# Patient Record
Sex: Female | Born: 1950
Health system: Southern US, Community
[De-identification: ages and names within clinical notes are randomized; demographics above are authoritative.]

## PROBLEM LIST (undated history)

## (undated) DIAGNOSIS — C49A4 Gastrointestinal stromal tumor of large intestine: Secondary | ICD-10-CM

## (undated) DIAGNOSIS — T7840XA Allergy, unspecified, initial encounter: Secondary | ICD-10-CM

## (undated) DIAGNOSIS — E785 Hyperlipidemia, unspecified: Secondary | ICD-10-CM

## (undated) DIAGNOSIS — G479 Sleep disorder, unspecified: Secondary | ICD-10-CM

## (undated) DIAGNOSIS — I10 Essential (primary) hypertension: Secondary | ICD-10-CM

## (undated) DIAGNOSIS — D649 Anemia, unspecified: Secondary | ICD-10-CM

## (undated) DIAGNOSIS — E063 Autoimmune thyroiditis: Secondary | ICD-10-CM

## (undated) DIAGNOSIS — K5792 Diverticulitis of intestine, part unspecified, without perforation or abscess without bleeding: Secondary | ICD-10-CM

## (undated) DIAGNOSIS — E538 Deficiency of other specified B group vitamins: Secondary | ICD-10-CM

## (undated) DIAGNOSIS — K900 Celiac disease: Secondary | ICD-10-CM

## (undated) DIAGNOSIS — K219 Gastro-esophageal reflux disease without esophagitis: Secondary | ICD-10-CM

## (undated) HISTORY — DX: Celiac disease: K90.0

## (undated) HISTORY — DX: Hyperlipidemia, unspecified: E78.5

## (undated) HISTORY — DX: Anemia, unspecified: D64.9

## (undated) HISTORY — DX: Essential (primary) hypertension: I10

## (undated) HISTORY — DX: Autoimmune thyroiditis: E06.3

## (undated) HISTORY — DX: Allergy, unspecified, initial encounter: T78.40XA

## (undated) HISTORY — DX: Sleep disorder, unspecified: G47.9

## (undated) HISTORY — DX: Deficiency of other specified B group vitamins: E53.8

## (undated) HISTORY — DX: Diverticulitis of intestine, part unspecified, without perforation or abscess without bleeding: K57.92

## (undated) HISTORY — PX: COLONOSCOPY: SHX174

## (undated) HISTORY — DX: Gastro-esophageal reflux disease without esophagitis: K21.9

## (undated) HISTORY — DX: Gastrointestinal stromal tumor of large intestine: C49.A4

## (undated) SURGERY — Surgical Case
Anesthesia: *Unknown

---

## 2000-10-27 ENCOUNTER — Inpatient Hospital Stay (HOSPITAL_COMMUNITY): Admission: EM | Admit: 2000-10-27 | Discharge: 2000-10-28 | Payer: Self-pay | Admitting: Emergency Medicine

## 2000-10-28 ENCOUNTER — Encounter: Payer: Self-pay | Admitting: Cardiovascular Disease

## 2002-10-11 HISTORY — PX: ESOPHAGOGASTRODUODENOSCOPY: SHX1529

## 2004-07-12 ENCOUNTER — Ambulatory Visit: Payer: Self-pay | Admitting: Obstetrics and Gynecology

## 2004-10-30 ENCOUNTER — Ambulatory Visit: Payer: Self-pay | Admitting: Internal Medicine

## 2005-04-01 ENCOUNTER — Ambulatory Visit: Payer: Self-pay | Admitting: Internal Medicine

## 2005-05-07 ENCOUNTER — Ambulatory Visit: Payer: Self-pay | Admitting: Internal Medicine

## 2005-05-09 ENCOUNTER — Ambulatory Visit: Payer: Self-pay | Admitting: Internal Medicine

## 2005-05-30 ENCOUNTER — Ambulatory Visit: Payer: Self-pay | Admitting: Internal Medicine

## 2005-11-11 ENCOUNTER — Ambulatory Visit: Payer: Self-pay | Admitting: Internal Medicine

## 2005-11-12 ENCOUNTER — Ambulatory Visit: Payer: Self-pay | Admitting: Gastroenterology

## 2005-11-27 ENCOUNTER — Ambulatory Visit: Payer: Self-pay | Admitting: Internal Medicine

## 2006-09-26 ENCOUNTER — Ambulatory Visit: Payer: Self-pay | Admitting: Obstetrics and Gynecology

## 2006-10-01 ENCOUNTER — Ambulatory Visit: Payer: Self-pay | Admitting: Obstetrics and Gynecology

## 2007-10-22 ENCOUNTER — Ambulatory Visit: Payer: Self-pay | Admitting: Internal Medicine

## 2007-10-22 DIAGNOSIS — K5730 Diverticulosis of large intestine without perforation or abscess without bleeding: Secondary | ICD-10-CM | POA: Insufficient documentation

## 2007-10-22 DIAGNOSIS — R002 Palpitations: Secondary | ICD-10-CM

## 2007-10-22 DIAGNOSIS — D649 Anemia, unspecified: Secondary | ICD-10-CM | POA: Insufficient documentation

## 2007-10-22 DIAGNOSIS — K900 Celiac disease: Secondary | ICD-10-CM

## 2007-10-22 DIAGNOSIS — I10 Essential (primary) hypertension: Secondary | ICD-10-CM

## 2007-10-23 ENCOUNTER — Encounter: Payer: Self-pay | Admitting: Internal Medicine

## 2007-11-16 ENCOUNTER — Ambulatory Visit: Payer: Self-pay | Admitting: Obstetrics and Gynecology

## 2007-12-11 ENCOUNTER — Ambulatory Visit: Payer: Self-pay | Admitting: Internal Medicine

## 2007-12-14 ENCOUNTER — Telehealth (INDEPENDENT_AMBULATORY_CARE_PROVIDER_SITE_OTHER): Payer: Self-pay | Admitting: *Deleted

## 2007-12-16 ENCOUNTER — Ambulatory Visit: Payer: Self-pay | Admitting: Internal Medicine

## 2008-01-11 ENCOUNTER — Ambulatory Visit: Payer: Self-pay | Admitting: Internal Medicine

## 2008-02-10 ENCOUNTER — Ambulatory Visit: Payer: Self-pay | Admitting: Internal Medicine

## 2008-02-29 ENCOUNTER — Ambulatory Visit: Payer: Self-pay | Admitting: Family Medicine

## 2008-03-01 ENCOUNTER — Telehealth: Payer: Self-pay | Admitting: Family Medicine

## 2008-03-12 ENCOUNTER — Ambulatory Visit: Payer: Self-pay | Admitting: Internal Medicine

## 2008-03-14 ENCOUNTER — Telehealth (INDEPENDENT_AMBULATORY_CARE_PROVIDER_SITE_OTHER): Payer: Self-pay | Admitting: *Deleted

## 2008-04-12 ENCOUNTER — Ambulatory Visit: Payer: Self-pay | Admitting: Family Medicine

## 2008-05-23 ENCOUNTER — Ambulatory Visit: Payer: Self-pay | Admitting: Family Medicine

## 2008-05-24 ENCOUNTER — Telehealth (INDEPENDENT_AMBULATORY_CARE_PROVIDER_SITE_OTHER): Payer: Self-pay | Admitting: *Deleted

## 2009-05-01 ENCOUNTER — Ambulatory Visit: Payer: Self-pay | Admitting: Internal Medicine

## 2009-05-29 ENCOUNTER — Encounter: Payer: Self-pay | Admitting: Internal Medicine

## 2009-06-05 ENCOUNTER — Ambulatory Visit: Payer: Self-pay | Admitting: Internal Medicine

## 2009-06-05 DIAGNOSIS — G479 Sleep disorder, unspecified: Secondary | ICD-10-CM | POA: Insufficient documentation

## 2009-08-28 ENCOUNTER — Ambulatory Visit: Payer: Self-pay | Admitting: Family Medicine

## 2010-01-12 ENCOUNTER — Ambulatory Visit: Payer: Self-pay | Admitting: Family Medicine

## 2010-02-15 ENCOUNTER — Ambulatory Visit: Payer: Self-pay | Admitting: Family Medicine

## 2010-02-15 DIAGNOSIS — M549 Dorsalgia, unspecified: Secondary | ICD-10-CM | POA: Insufficient documentation

## 2010-06-04 ENCOUNTER — Encounter: Payer: Self-pay | Admitting: Internal Medicine

## 2010-07-26 ENCOUNTER — Telehealth: Payer: Self-pay | Admitting: Internal Medicine

## 2010-08-20 ENCOUNTER — Ambulatory Visit
Admission: RE | Admit: 2010-08-20 | Discharge: 2010-08-20 | Payer: Self-pay | Source: Home / Self Care | Attending: Internal Medicine | Admitting: Internal Medicine

## 2010-08-20 ENCOUNTER — Encounter: Payer: Self-pay | Admitting: Internal Medicine

## 2010-08-20 DIAGNOSIS — M722 Plantar fascial fibromatosis: Secondary | ICD-10-CM | POA: Insufficient documentation

## 2010-08-20 DIAGNOSIS — R079 Chest pain, unspecified: Secondary | ICD-10-CM | POA: Insufficient documentation

## 2010-08-22 ENCOUNTER — Ambulatory Visit
Admission: RE | Admit: 2010-08-22 | Discharge: 2010-08-22 | Payer: Self-pay | Source: Home / Self Care | Attending: Family Medicine | Admitting: Family Medicine

## 2010-08-22 DIAGNOSIS — M21619 Bunion of unspecified foot: Secondary | ICD-10-CM | POA: Insufficient documentation

## 2010-08-22 DIAGNOSIS — M216X9 Other acquired deformities of unspecified foot: Secondary | ICD-10-CM | POA: Insufficient documentation

## 2010-08-30 ENCOUNTER — Encounter: Payer: Self-pay | Admitting: Internal Medicine

## 2010-08-30 ENCOUNTER — Ambulatory Visit
Admission: RE | Admit: 2010-08-30 | Discharge: 2010-08-30 | Payer: Self-pay | Source: Home / Self Care | Attending: Internal Medicine | Admitting: Internal Medicine

## 2010-09-11 NOTE — Assessment & Plan Note (Signed)
Summary: back pain/alc   Vital Signs:  Patient profile:   60 year old female Height:      65 inches Weight:      184.0 pounds BMI:     30.73 Temp:     97.7 degrees F oral Pulse rate:   80 / minute Pulse rhythm:   regular BP sitting:   102 / 72  (left arm) Cuff size:   regular  Vitals Entered By: Benny Lennert CMA Duncan Dull) (February 15, 2010 10:31 AM)  History of Present Illness: Chief complaint back pain  60 year old female:  Pulling a elastic hose and using her pressure washer Was about two or three weeks ago, tried some ice and heat, some ibuprofen.   Hurting so bad.     Back Pain History:      The patient's back pain started approximately 02/04/2010.  The pain is located in the lower back region and does not radiate below the knees.  She states this is not work related.  She states that she has no prior history of back pain.  The patient has not had any recent physical therapy for her back pain.  The following makes the back pain better: stretching.  The following makes the back pain worse: certain twisting motions.        Description of injury in patient's own words:  patient was using power washer on driveway, twisting and pulling hose, then felt some pain in the low back and in buttocks area.    Critical Exclusionary Diagnosis Criteria (CEDC) for Back Pain:      The patient denies a history of previous trauma.  She has no prior history of spinal surgery.  There are no symptoms to suggest infection, cauda equina, or psychosocial factors for back pain.  Cancer risk factors include age >50 yrs with new back pain.    Allergies: 1)  ! * Codiene  Past History:  Past medical, surgical, family and social histories (including risk factors) reviewed, and no changes noted (except as noted below).  Past Medical History: Reviewed history from 04/12/2008 and no changes required. Anemia-NOS Diverticulosis, colon Hypertension Celiac disease Sleep disturbance  Past Surgical  History: Reviewed history from 10/22/2007 and no changes required. EGD/Colon 3/04 Cardiolite negative 8/05  Family History: Reviewed history from 10/22/2007 and no changes required. Dad died with recurrent pancreatitis then irreg heart. Had CVAs also  @75  Mom died @68  of MI 2 sisters--1 with psoriasis and psoriatic arthritis CAD is strong in family Thyroid disease in family No breast or colon cancer  Social History: Reviewed history from 10/22/2007 and no changes required. 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 Otherwise the pertinent positives of the ROS are noted above.    Physical Exam  General:  GEN: Well-developed,well-nourished,in no acute distress; alert,appropriate and cooperative throughout examination HEENT: Normocephalic and atraumatic without obvious abnormalities. No apparent alopecia or balding. Ears, externally no deformities PULM: Breathing comfortably in no respiratory distress EXT: No clubbing, cyanosis, or edema PSYCH: Normally interactive. Cooperative during the interview. Pleasant. Friendly and conversant. Not anxious or depressed appearing. Normal, full affect.  Msk:  Normal Greater trochanteric bursae Full hip ROM Negative Pearlean Brownie + Reverse Faber Sciatic Notches: mildly  TTP Sensation to Gross touch WNL Sensation to pinpricnk WNL DTR 2+ knee and ankle no clonus DP and PT pulses are normal B   Low Back Pain Physical Exam:    Inspection-deformity:     No    Palpation-spinal tenderness:   Yes       Location:   L5-S1    Motor Exam/Strength:         Left Ankle Dorsiflexion (L5,L4):     normal       Left Great Toe Dorsiflexion (L5,L4):     normal        Left Heel Walk (L5,some L4):     normal       Left Single Squat & Rise-Quads (L4):   normal       Left Toe Walk-calf (S1):       normal       Right Ankle Dorsiflexion (L5,L4):     normal       Right Great Toe Dorsiflexion (L5,L4):       normal       Right Heel Walk (L5,some L4):     normal       Right Single Squat & Rise Quads (L4):   normal       Right Toe Walk-calf (S1):       normal    Sensory Exam/Pinprick:        Left Medial Foot (L4):   normal       Left Dorsal Foot (L5):   normal       Left Lateral Foot (S1):   normal       Right Medial Foot (L4):   normal       Right Dorsal Foot (L5):   normal       Right Lateral Foot (S1):   normal    Reflexes:        Left Knee Jerk (L4):     normal       Left Ankle Reflex (S1):   normal       Right Knee Jerk:     normal       Right Ankle Reflex (S1):   normal    Straight Leg Raise (SLR):       Left Straight Leg Raise (SLR):   negative       Right Straight Leg Raise (SLR):   negative   Impression & Recommendations:  Problem # 1:  BACK PAIN (ICD-724.5) Mechanical back pain  no red flags  I reviewed with the patient a handout from [employer] and [facility] regarding their condition and a set of home exercises. They indicated that they understood what was discussed with them. All questions answered.   c/w NSAIDS, heat, massage, streching, muscle relaxant at night, and likely will cont to improve -- better since yest  Her updated medication list for this problem includes:    Tizanidine Hcl 4 Mg Tabs (Tizanidine hcl) .Marland Kitchen... 1 by mouth at bedtime as needed back pain  Complete Medication List: 1)  Trazodone Hcl 50 Mg Tabs (Trazodone hcl) .Marland Kitchen.. 1-2 tabs at bedtime to help sleep 2)  Triam Hcl  .... Take one tablet dialy 3)  Tizanidine Hcl 4 Mg Tabs (Tizanidine hcl) .Marland Kitchen.. 1 by mouth at bedtime as needed back pain Prescriptions: TIZANIDINE HCL 4 MG TABS (TIZANIDINE HCL) 1 by mouth at bedtime as needed back pain  #30 x 0    Entered and Authorized by:   Hannah Beat MD   Signed by:  Hannah Beat MD on 02/15/2010   Method used:   Electronically to        [facility] [address] (retail)       [address]       [place], [place]  [address]       Ph: [phone]       Fax: [phone]   RxID:   [id number]   Current Allergies (reviewed today): ! * CODIENE

## 2010-09-11 NOTE — Assessment & Plan Note (Signed)
Summary: COLD-SINUS INFECTION/JBB   Vital Signs:  Patient Profile:   61 Years Old Female CC:      Cold & URI symptoms Height:     65 inches Weight:      179 pounds BMI:     29.89 O2 Sat:      99 % O2 treatment:    Room Air Temp:     97.0 degrees F oral Pulse rate:   80 / minute Pulse rhythm:   regular Resp:     20 per minute BP sitting:   128 / 61  (left arm)  Pt. in pain?   no  Vitals Entered By: Levonne Spiller EMT-P (January 12, 2010 11:37 AM)              Is Patient Diabetic? No      Current Allergies: ! * CODIENE  History of Present Illness History from: patient Reason for visit: see chief complaint Chief Complaint: Cold & URI symptoms History of Present Illness: About 2 weeks has had congestion in head and in chest. Feels run down. No fever/chills. +rhinorrhea, cough - productive of yellowish sputum. Cough worse at night - unable to sleep last night for the cough. has tried contact D, nighttime cough meds, benadryl. laryngitis. Had a similar episode for about a week prior to this one episode, and had a really sore throat.    Physical Exam General appearance: well developed, well nourished, no acute distress + horseness Eyes: conjunctivae and lids normal Ears: normal, no lesions or deformities - + dullness bilaterally Nasal: swollen red turbinates with congestion Oral/Pharynx: tongue normal, posterior pharynx without erythema or exudate Neck: no lymphadenopathy Thyroid: diffuse enlargement - nontender Chest/Lungs: no rales, wheezes, or rhonchi bilateral, breath sounds equal without effort Heart: regular rate and  rhythm, no murmur MSE: oriented to time, place, and person Assessment New Problems: OTHER ACUTE SINUSITIS (ICD-461.8)   Patient Education: Patient and/or caregiver instructed in the following: fluids.  Plan New Medications/Changes: AMOXICILLIN 875 MG TABS (AMOXICILLIN) 1 by mouth two times a day x 10 days  #20 x 0, 01/12/2010, Tacey Ruiz  MD PROMETHAZINE-DM 6.25-15 MG/5ML SYRP (PROMETHAZINE-DM) 1-2 tsp by mouth Q 6-8 hours as needed cough  #120 x 0, 01/12/2010, Tacey Ruiz MD  New Orders: Est. Patient Level III [id number] Planning Comments:   May continue to take over the counter decongestant.   The patient and/or caregiver has been counseled thoroughly with regard to medications prescribed including dosage, schedule, interactions, rationale for use, and possible side effects and they verbalize understanding.  Diagnoses and expected course of recovery discussed and will return if not improved as expected or if the condition worsens. Patient and/or caregiver verbalized understanding.  Prescriptions: AMOXICILLIN 875 MG TABS (AMOXICILLIN) 1 by mouth two times a day x 10 days  #20 x 0   Entered and Authorized by:   Tacey Ruiz MD   Signed by:   Tacey Ruiz MD on 01/12/2010   Method used:   Electronically to        [facility] [address] (retail)       [address], [address]       [place], [place]  [address]       Ph: [phone]       Fax: [phone]   RxID:   [id number] PROMETHAZINE-DM 6.25-15 MG/5ML SYRP (PROMETHAZINE-DM) 1-2 tsp by mouth Q 6-8 hours as needed cough  #120 x 0  Entered and Authorized by:   Tacey Ruiz MD   Signed by:   Tacey Ruiz MD on 01/12/2010   Method used:   Electronically to        [facility] [address] (retail)       [address], [address]       [place], [place]  [address]       Ph: [phone]       Fax: [phone]   RxID:   [id number]   Orders Added: 1)  Est. Patient Level III [99213]  It was clearly explained to the patient that this [facility] is not intended to be a primary care clinic.  The patient is always better served by the continuity of care and the provider/patient relationships developed with their dedicated primary care provider.  The patient was told to be sure to follow up as soon as possible with  their primary care provider to discuss treatments received and to receive further examination and testing.  The patient verbalized understanding. The will f/u with PCP ASAP.  The risks, benefits and possible side effects were clearly explained and discussed with the patient.  The patient verbalized clear understanding.  The patient was given instructions to return if symptoms don't improve, worsen or new changes develop.  If it is not during clinic hours and the patient cannot get back to this clinic then the patient was told to seek medical care at an available urgent care or emergency department.  The patient verbalized understanding.    The patient was informed that there is no on-call provider or services available at this clinic during off-hours (when the clinic is closed).  If the patient developed a problem or concern that required immediate attention, the patient was advised to go the the nearest available urgent care or emergency department for medical care.  The patient verbalized understanding.     I have reviewed the above medical office visit documention, including diagnoses, history, medications, clinical lists, orders and plan of care.   Rodney Langton, MD, FAAFP  January 13, 2010 Changed allergy or adverse reaction from * CODIENE to [facility] - Signed

## 2010-09-11 NOTE — Assessment & Plan Note (Signed)
Summary: sore throat, cough 2:15   Vital Signs:  Patient profile:   60 year old female Height:      67 inches Weight:      180 pounds BMI:     28.29 Temp:     97.9 degrees F oral Pulse rate:   76 / minute Pulse rhythm:   regular BP sitting:   102 / 68  (left arm) Cuff size:   regular  Vitals Entered By: Christena Deem CMA Deborra Medina) (August 28, 2009 2:21 PM) CC: ST, cough, congestion   History of Present Illness: 60 yo with 3 1/2 weeks of nasal congestion, sinus pressure, subjective fever, chills. Productive coughing spells at night. Trying OTC Sudafed with mild relief of symptoms. Actually felt like symptoms were worsening over past couple of days.  No wheezing, SOB, n/v/d, or rashes.  Current Medications (verified): 1)  Lisinopril-Hydrochlorothiazide 10-12.5 Mg Tabs (Lisinopril-Hydrochlorothiazide) .Marland Kitchen.. 1 Tab Daily For Blood Pressure 2)  Trazodone Hcl 50 Mg Tabs (Trazodone Hcl) .Marland Kitchen.. 1-2 Tabs At Bedtime To Help Sleep 3)  Augmentin 875-125 Mg Tabs (Amoxicillin-Pot Clavulanate) .Marland Kitchen.. 1 By Mouth 2 Times Daily X 10 Days 4)  Tessalon Perles 100 Mg  Caps (Benzonatate) .Marland Kitchen.. 1 Cap By Mouth Three Times A Day As Needed Cough. 5)  Mucinex Maximum Strength 1200 Mg Xr12h-Tab (Guaifenesin) .Marland Kitchen.. 1 Tab By Mouth Two Times A Day As Needed Congestion.  Allergies: 1)  ! * Codiene  Review of Systems      See HPI General:  Complains of chills and fever. ENT:  Complains of postnasal drainage and sinus pressure; denies earache and sore throat. Resp:  Complains of cough and sputum productive; denies shortness of breath and wheezing.  Physical Exam  General:  alert and normal appearance.   Nose:  nasal dischargemucosal pallor.   maxillary sinuses TTP Mouth:  MMM Lungs:  normal respiratory effort and normal breath sounds.   Heart:  normal rate, regular rhythm, no murmur, and no gallop.   Abdomen:  soft and non-tender.   Psych:  normally interactive, good eye contact, not anxious appearing, and  not depressed appearing.     Impression & Recommendations:  Problem # 1:  OTHER ACUTE SINUSITIS (ICD-461.8) Assessment New Given duration of symptoms, will treat with abx. See patient instructions for details. Her updated medication list for this problem includes:    Augmentin 875-125 Mg Tabs (Amoxicillin-pot clavulanate) .Marland Kitchen... 1 by mouth 2 times daily x 10 days    Tessalon Perles 100 Mg Caps (Benzonatate) .Marland Kitchen... 1 cap by mouth three times a day as needed cough.    Mucinex Maximum Strength 1200 Mg Xr12h-tab (Guaifenesin) .Marland Kitchen... 1 tab by mouth two times a day as needed congestion.  Complete Medication List: 1)  Lisinopril-hydrochlorothiazide 10-12.5 Mg Tabs (Lisinopril-hydrochlorothiazide) .Marland Kitchen.. 1 tab daily for blood pressure 2)  Trazodone Hcl 50 Mg Tabs (Trazodone hcl) .Marland Kitchen.. 1-2 tabs at bedtime to help sleep 3)  Augmentin 875-125 Mg Tabs (Amoxicillin-pot clavulanate) .Marland Kitchen.. 1 by mouth 2 times daily x 10 days 4)  Tessalon Perles 100 Mg Caps (Benzonatate) .Marland Kitchen.. 1 cap by mouth three times a day as needed cough. 5)  Mucinex Maximum Strength 1200 Mg Xr12h-tab (Guaifenesin) .Marland Kitchen.. 1 tab by mouth two times a day as needed congestion.  Patient Instructions: 1)  Take antibiotic as directed.  Drink lots of fluids.  Treat sympotmatically with Mucinex, nasal saline irrigation, and Tylenol/Ibuprofen. You can also try using warm compresses.  Cough suppressant at night. Call if not improving as expected  in 5-7 days.  Prescriptions: MUCINEX MAXIMUM STRENGTH 1200 MG XR12H-TAB (GUAIFENESIN) 1 tab by mouth two times a day as needed congestion.  #60 x 0   Entered and Authorized by:   Arnette Norris MD   Signed by:   Arnette Norris MD on 08/28/2009   Method used:   Electronically to        [facility] (retail)       [place], [place]  [address]       Ph: [phone]       Fax: [phone]   RxID:   [id number] TESSALON PERLES 100 MG  CAPS  (BENZONATATE) 1 cap by mouth three times a day as needed cough.  #60 x 0   Entered and Authorized by:   Arnette Norris MD   Signed by:   Arnette Norris MD on 08/28/2009   Method used:   Electronically to        [facility] (retail)       [place], [place]  [address]       Ph: [phone]       Fax: [phone]   RxID:   [id number] AUGMENTIN 875-125 MG TABS (AMOXICILLIN-POT CLAVULANATE) 1 by mouth 2 times daily x 10 days  #20 x 0   Entered and Authorized by:   Arnette Norris MD   Signed by:   Arnette Norris MD on 08/28/2009   Method used:   Electronically to        [facility] [address] (retail)       [address], [place], [place]  [address]       Ph: [phone]       Fax: [phone]   RxID:   [id number]   Current Allergies (reviewed today): ! * CODIENE

## 2010-09-13 NOTE — Assessment & Plan Note (Signed)
Summary: med refill/ds   Vital Signs:  Patient profile:   60 year old female Weight:      188 pounds Temp:     98.5 degrees F oral Pulse rate:   88 / minute Pulse rhythm:   regular BP sitting:   110 / 80  (left arm) Cuff size:   regular  Vitals Entered By: Edwin Dada CMA Deborra Medina) (August 20, 2010 4:07 PM) CC: medication refill   History of Present Illness: Feeling okay Occ checks BP--runs good (like 110/80)  Having bad pain in feet mainly in AM---hard to even step on her feet Occurs after sitting for awhile  Occ chest pain Slight SOB has had ongoing cold and this has given her a cough Relates this to sitting all day Pain and dyspnea may be with sitting or exertion Some palpitations as well--this is at rest has been trying to start exercising--feet are limiting her  Sleeps fairly well uses 1 trazodone and 1 melatonin  Allergies: 1)  ! * Codiene  Past History:  Past medical, surgical, family and social histories (including risk factors) reviewed for relevance to current acute and chronic problems.  Past Medical History: Reviewed history from 04/12/2008 and no changes required. Anemia-NOS Diverticulosis, colon Hypertension Celiac disease Sleep disturbance  Past Surgical History: Reviewed history from 10/22/2007 and no changes required. EGD/Colon 3/04 Cardiolite negative 8/05  Family History: Reviewed history from 10/22/2007 and no changes required. Dad died with recurrent pancreatitis then irreg heart. Had CVAs also  @75  Mom died @68  of MI 2 sisters--1 with psoriasis and psoriatic arthritis CAD is strong in family Thyroid disease in family No breast or colon cancer  Social History: Reviewed history from 10/22/2007 and no changes required. 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   Neck:  supple, no masses, and no cervical lymphadenopathy.  Thyroid palpable but without nodules Lungs:  normal respiratory effort, no intercostal retractions, no accessory muscle use, and normal breath sounds.   Heart:  normal rate, regular rhythm, no murmur, and no gallop.   Abdomen:  soft and non-tender.   Pulses:  1+ in feet Extremities:  no edema Psych:  normally interactive, good eye contact, not anxious appearing, and not depressed appearing.     Impression & Recommendations:  Problem # 1:  CHEST PAIN (ICD-786.50) Assessment New  atypical symptoms may be some stress and deconditioning Normal EKG is reassuring   discussed increasing exercise if can't, or has symptoms, will check stress echo  Orders: EKG w/ Interpretation (93000) TLB-T4 (Thyrox), Free [phone])  Problem # 2:  HYPERTENSION (ICD-401.9) Assessment: Unchanged  good control no changes needed  Her updated medication list for this problem includes:    Lisinopril-hydrochlorothiazide 10-12.5 Mg Tabs (Lisinopril-hydrochlorothiazide) .Marland Kitchen... Take 1 by mouth once daily  BP today: 110/80 Prior BP: 102/72 (02/15/2010)  Orders: TLB-Renal Function Panel (80069-RENAL) TLB-CBC Platelet - w/Differential (85025-CBCD) TLB-Hepatic/Liver Function Pnl (80076-HEPATIC) TLB-TSH (Thyroid Stimulating Hormone) (84443-TSH) Venipuncture (96295)  Problem # 3:  SLEEP DISORDER (ICD-780.50) Assessment: Unchanged doing well with meds  Problem # 4:  PLANTAR FASCIITIS (ICD-728.71) Assessment: New discussed mechanical issues will set up appt with Dr Lorelei Pont  Complete Medication List: 1)  Trazodone Hcl 50 Mg Tabs (Trazodone hcl) .Marland Kitchen.. 1-2 tabs at bedtime to help sleep 2)  Lisinopril-hydrochlorothiazide 10-12.5 Mg Tabs (Lisinopril-hydrochlorothiazide) .... Take 1 by mouth once daily  Patient Instructions: 1)  Please get stability shoes and then add an insert for arch support. 2)  Please set up  appointment with Dr Lorelei Pont for evaluation of plantar fasciitis 3)  Please schedule a follow-up appointment in 6 months .  Prescriptions: TRAZODONE HCL 50 MG TABS (TRAZODONE HCL) 1-2 tabs at bedtime to help sleep  #180 x 3   Entered and Authorized by:   Claris Gower MD   Signed by:   Claris Gower MD on 08/20/2010   Method used:   Electronically to        [facility] (retail)       [facility]       [address] [place]  [address]       Ph: [phone]       Fax: [phone]   RxID:   [id number] LISINOPRIL-HYDROCHLOROTHIAZIDE 10-12.5 MG TABS (LISINOPRIL-HYDROCHLOROTHIAZIDE) take 1 by mouth once daily  #90 x 3   Entered by:   Edwin Dada CMA ([place])   Authorized by:   Claris Gower MD   Signed by:   Claris Gower MD on 08/20/2010   Method used:   Electronically to        [facility] (retail)       [facility]       [address] [place]  [address]       Ph: [phone]       Fax: [phone]   [place]:   [phone]    Orders Added: 1)  EKG w/ Interpretation [93000] 2)  Est. Patient Level IV [51025] 3)  TLB-Renal Function Panel [80069-RENAL] 4)  TLB-CBC Platelet - w/Differential [85025-CBCD] 5)  TLB-Hepatic/Liver Function Pnl [80076-HEPATIC] 6)  TLB-TSH (Thyroid Stimulating Hormone) [84443-TSH] 7)  Venipuncture [85277] 8)  TLB-T4 (Thyrox), Free [82423-NT6R]    Current Allergies (reviewed today): ! * CODIENE  Appended Document: med refill/ds

## 2010-09-13 NOTE — Letter (Signed)
Summary: Screening/[facility] Screening  Screening/[facility] Screening   Imported By: Lanelle Bal 08/28/2010 07:54:17  _____________________________________________________________________  External Attachment:    Type:   Image     Comment:   External Document

## 2010-09-13 NOTE — Progress Notes (Signed)
Summary: TRAZODONE  Phone Note Refill Request Message from:  [facility] [phone] on July 26, 2010 8:43 AM  Refills Requested: Medication #1:  TRAZODONE HCL 50 MG TABS 1-2 tabs at bedtime to help sleep   Last Refilled: 06/11/2010 E-Scribe Request, called pt to advise she needs to be seen, last OV was 04/2009. Per pt she would like a CPX, Lyla Son and I looked at your schedule and CPX's are booked out until April 2012, so I scheduled pt regular OV on 08/20/2010. ok to refill until then? Please advise.   Method Requested: Electronic Initial call taken by: Mervin Hack CMA Duncan Dull),  July 26, 2010 8:45 AM  Follow-up for Phone Call        I have plenty of space on December 30th Please add her in for physical then and refill trazodone x 1 month cancel the January visit Follow-up by: Cindee Salt MD,  July 26, 2010 1:29 PM  Additional Follow-up for Phone Call Additional follow up Details #1::        Rx faxed to pharmacy, left message on machine at home for patient to return my call.  DeShannon Smith CMA Duncan Dull)  July 26, 2010 3:58 PM     Prescriptions: TRAZODONE HCL 50 MG TABS (TRAZODONE HCL) 1-2 tabs at bedtime to help sleep  #60 Each x 1   Entered by:   Mervin Hack CMA (AAMA)   Authorized by:   Cindee Salt MD   Signed by:   Mervin Hack CMA (AAMA) on 07/26/2010   Method used:   Electronically to        [facility] [address]retail)       [address], [address]       [place], [place]  [address]       Ph: [phone]       Fax: [phone]   RxID:   [id number]

## 2010-09-13 NOTE — Assessment & Plan Note (Signed)
Summary: EVALUATION OF PLANTAR FASCITIS/JRR   Vital Signs:  Patient profile:   60 year old female Height:      65 inches Weight:      189.75 pounds BMI:     31.69 Temp:     97.7 degrees F oral Pulse rate:   88 / minute Pulse rhythm:   regular BP sitting:   100 / 60  (left arm) Cuff size:   regular  Vitals Entered By: Benny Lennert CMA Duncan Dull) (August 22, 2010 12:05 PM)  History of Present Illness: Chief complaint evaluation for plantar fascitis  Dr. Alphonsus Sias has requested an evaluation for foot pain in this 60 year old pleasant female:  Has been ongoing for six months, has been trying some  occaisional stretching her foot, plantar and dorsiflexion, recently bought some Asics.  The patient presents with a 6 month long history of heel pain. This is notable for worsening pain first thing in the morning when arising and standing after sitting.   Prior foot or ankle fractures: none Prior operations: none Orthotics or bracing: none Medications: none PT or home rehab: none Night splints: no Ice massage: no Ball massage: no  Metatarsal pain: no    arch straps heel cups  night splint  Allergies: 1)  ! * Codiene  Past History:  Past medical, surgical, family and social histories (including risk factors) reviewed, and no changes noted (except as noted below).  Past Medical History: Reviewed history from 04/12/2008 and no changes required. Anemia-NOS Diverticulosis, colon Hypertension Celiac disease Sleep disturbance  Past Surgical History: Reviewed history from 10/22/2007 and no changes required. EGD/Colon 3/04 Cardiolite negative 8/05  Family History: Reviewed history from 10/22/2007 and no changes required. Dad died with recurrent pancreatitis then irreg heart. Had CVAs also  @75  Mom died @68  of MI 2 sisters--1 with psoriasis and psoriatic arthritis CAD is strong in family Thyroid disease in family No breast or colon cancer  Social History: Reviewed  history from 10/22/2007 and no changes required. 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 Otherwise the pertinent positives of the ROS are noted above.    Physical Exam  General:  GEN: Well-developed,well-nourished,in no acute distress; alert,appropriate and cooperative throughout examination HEENT: Normocephalic and atraumatic without obvious abnormalities. No apparent alopecia or balding. Ears, externally no deformities PULM: Breathing comfortably in no respiratory distress EXT: No clubbing, cyanosis, or edema PSYCH: Normally interactive. Cooperative during the interview. Pleasant. Friendly and conversant. Not anxious or depressed appearing. Normal, full affect.  Msk:  Echymosis: no Edema: no ROM: full LE B Gait: heel toe, non-antalgic MT pain: no Callus pattern: none Lateral Mall: NT Medial Mall: NT Talus: NT Navicular: NT Calcaneous: NT Metatarsals: NT 5th MT: NT Phalanges: NT Achilles: NT Plantar Fascia: tender, medial along PF. Pain with forced dorsi Fat Pad: NT Peroneals: NT Post Tib: NT Great Toe: Nml motion Ant Drawer: neg Other foot breakdown: none Long arch: some breakdown - natural cavus foot Transverse arch: some bunion and bunionette formation Hindfoot breakdown: none Sensation: intact    Impression & Recommendations:  Problem # 1:  PLANTAR FASCIITIS (ICD-728.71)  Recommendations:  We reviewed that stretching is critically important to the treatment of PF. Reviewed footwear. Rigid soles have  been shown to help with PF. Night splints can help - placed in night splint for night x 1 Reviewed rehab of stretching and calf raises, from [facility]. Massage,  proper stretching, and toe and foot strengthening. The patient would be an ideal candidate for custom orthotics, which were shown in a 2008 Cochrane Review to be beneficial in PF: but for now, I would start with an OTC that we discussed. Could benefit from a corticosteroid injection if conservative treatment fails.   f/u 6 weeks if not improving.  cc: Dr. 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 1-2 tabs at bedtime to help sleep 2)  Lisinopril-hydrochlorothiazide 10-12.5 Mg Tabs (Lisinopril-hydrochlorothiazide) .... Take 1 by mouth once daily  Patient Instructions: 1)  Excellent Over the Counter Orthotics: 2)  Hapad: available at www.hapad.com 3)  SPENCO: Available at some sports stores or www.amazon.com 4)  www.pedifix.com and [employer]" website: multiple good foot products 5)  SHOES: Danskos, Merrells, Keens, Clarks - good arch support, want minimal bendability 6)  [employer] in [place]: excellent staff, shoe selection: for athletic shoes 7)  Shoes: [employer], [employer] 8)  ***THE [facility], [address] [place], [place]***    Orders Added: 1)  Foot Orthosis ( Arch Strap/Heel Cup) [U0454] 2)  Consultation Level III [09811]    Current Allergies (reviewed today): ! * CODIENE

## 2010-10-02 ENCOUNTER — Encounter (INDEPENDENT_AMBULATORY_CARE_PROVIDER_SITE_OTHER): Payer: Self-pay | Admitting: *Deleted

## 2010-10-02 ENCOUNTER — Encounter: Payer: Self-pay | Admitting: Internal Medicine

## 2010-10-02 ENCOUNTER — Other Ambulatory Visit (INDEPENDENT_AMBULATORY_CARE_PROVIDER_SITE_OTHER): Payer: 59

## 2010-10-02 DIAGNOSIS — D649 Anemia, unspecified: Secondary | ICD-10-CM

## 2010-12-28 NOTE — Discharge Summary (Signed)
[facility]  Patient:    Sharon Shepherd, Sharon Shepherd                     MRN: [id number] Adm. Date:  [id number] Disc. Date: 10/28/00 Attending:  Bosie Clos Dictator:   Arn Medal, P.A. CC:         Miguel Aschoff, M.D., [address], [place], [place] [address]                           Discharge Summary  DISCHARGE DIAGNOSES: 1. Chest pain, status post negative Cardiolite exam. 2. Migraine headaches. 3. Allergic rhinitis.  ADMISSION HISTORY:  The patient presented to the emergency room complaining of chest pain into the left arm which was intermittent in nature.  The patient stated the chest pain was not tied to exertion.  It had begun the previous night.  She saw Dr. Rosanna Randy in the office, and he referred her to [place] for evaluation.  HOSPITAL COURSE:  She was seen and admitted by Dr. Jenkins Rouge.  He felt that the patients symptoms were somewhat atypical for coronary artery disease.  He scheduled the patient for Cardiolite exam the following morning.  The following day, the patient was seen by Dr. Addison Lank.  The patient had no more chest pain, however, some slight arm discomfort.  He noted that the patient had been under considerable stress lately due to the recent death of her mother.  Later that day, the patient underwent a GXT Cardiolite exam. Nuclear imaging revealed an ejection fraction of 51%, no wall motion abnormalities, no ischemia, and some breast attenuation.  Noting these results, the patient was felt to be stable for discharge.  DISCHARGE MEDICATIONS: 1. Inderal LA 80 mg q.d. 2. Enteric-coated aspirin 325 mg q.d.  DISCHARGE INSTRUCTIONS:  The patient was advised to return to her normal level of activity.  She is to eat a low-fat, low-cholesterol diet.  DISCHARGE FOLLOWUP:  She is to follow up with Dr. Rosanna Randy as needed or scheduled preferably within the next two weeks.  LABORATORY AND X-RAY DATA:  Lab results:   White count 7.1, hemoglobin 9.4, hematocrit 29.1, RDW 15.4, platelets 256.  Sodium 139, potassium 3.6, chloride 112, CO2 23, BUN 13, creatinine 0.7.  Cardiac enzymes serially negative for MI.  Total cholesterol 194, triglycerides 76, HDL 43, LDL 136, total cholesterol/HDL ratio 4.5.  Note:  The patient was advised of the cholesterol results and counseled as to lifestyle modifications.  She was told to follow up this with Dr. Rosanna Randy.  TSH 3.805.  Urinalysis was negative.  EKG revealed sinus rhythm approximately 60, P-R interval was 0.163, QRS 0.088, Q-Tc of 0.425, axis noted to be 94. DD:  10/28/00 TD:  10/29/00 Job: [id number]

## 2011-01-14 ENCOUNTER — Encounter: Payer: Self-pay | Admitting: Internal Medicine

## 2011-01-15 ENCOUNTER — Encounter: Payer: Self-pay | Admitting: Internal Medicine

## 2011-01-15 ENCOUNTER — Ambulatory Visit (INDEPENDENT_AMBULATORY_CARE_PROVIDER_SITE_OTHER): Payer: 59 | Admitting: Internal Medicine

## 2011-01-15 DIAGNOSIS — I10 Essential (primary) hypertension: Secondary | ICD-10-CM

## 2011-01-15 DIAGNOSIS — G479 Sleep disorder, unspecified: Secondary | ICD-10-CM

## 2011-01-15 DIAGNOSIS — Z0000 Encounter for general adult medical examination without abnormal findings: Secondary | ICD-10-CM | POA: Insufficient documentation

## 2011-01-15 NOTE — Assessment & Plan Note (Signed)
Chronic Will have her try to take the trazodone slightly earlier and take 2 instead of 1.5

## 2011-01-15 NOTE — Assessment & Plan Note (Signed)
BP remains low Will try cutting med in half Check labs

## 2011-01-15 NOTE — Progress Notes (Signed)
Subjective:    Patient ID: Sharon Shepherd, female    DOB: Dec 11, 1950, 60 y.o.   MRN: [id number]  HPI Doing okay Still with plantar fasciitis and has had orthotics made Saw podiatrist and had 2 injections Does affect her activity levels  Has stopped the iron pills Wonders about her vitamin D level also---does take supplement  Doesn't check BP Has been low here BP Readings from Last 3 Encounters:  01/15/11 108/60  08/22/10 100/60  08/20/10 110/80   Sees Dr Linward Foster for gyn care Gets pap and mammo through him  Current outpatient prescriptions:ferrous gluconate (FERGON) 325 MG tablet, Take 325 mg by mouth daily with breakfast.  , Disp: , Rfl: ;  lisinopril-hydrochlorothiazide (PRINZIDE,ZESTORETIC) 10-12.5 MG per tablet, Take 1 tablet by mouth daily.  , Disp: , Rfl: ;  traZODone (DESYREL) 50 MG tablet, Take 50-100 mg by mouth at bedtime.  , Disp: , Rfl: ;  vitamin B-12 (CYANOCOBALAMIN) 1000 MCG tablet, Take 1,000 mcg by mouth daily.  , Disp: , Rfl:   Past Medical History  Diagnosis Date  . Anemia   . Hypertension   . Diverticulitis   . Celiac disease   . Sleep disturbance     Past Surgical History  Procedure Date  . Esophagogastroduodenoscopy 03/04    colon    Family History  Problem Relation Age of Onset  . Stroke Father     History   Social History  . Marital Status: Married    Spouse Name: N/A    Number of Children: 2  . 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 Smoking status: Never Smoker   . Smokeless tobacco: Never Used  . Alcohol Use: Yes     rare  . Drug Use: Not on file  . Sexually Active: Not on file   Other Topics Concern  . Not on file   Social History Narrative  . No narrative on file   Review of Systems  Constitutional: Negative for fatigue and unexpected weight change.       Wears seat belt  HENT: Positive for tinnitus. Negative for hearing loss, congestion, rhinorrhea and dental  problem.   Eyes: Negative for visual disturbance.       No diplopia or focal vision loss  Respiratory: Negative for cough, chest tightness and shortness of breath.   Cardiovascular: Positive for chest pain. Negative for palpitations and leg swelling.       Gets little sharp "tweaks" of pain in chest---usually lasts no more than 1-2 minutes  Gastrointestinal: Positive for abdominal pain, diarrhea and constipation.       Bowels may be irregular if eats occ gluten No blood No sig heartburn  Genitourinary: Negative for dysuria, difficulty urinating and dyspareunia.       Occ stress incontinence Doesn't use pad though  Musculoskeletal: Negative for back pain, joint swelling and arthralgias.  Skin: Negative for rash.       No suspicious lesions  Neurological: Negative for dizziness, syncope, weakness, numbness and headaches.       Headaches have calmed down  Hematological: Negative for adenopathy. Does not bruise/bleed easily.  Psychiatric/Behavioral: Positive for sleep disturbance. Negative for dysphoric mood. The patient is not nervous/anxious.        Takes 75mg  of trazodone--groggy if she takes more Occ mild daytime somnolence but doesn't fall asleep       Objective:   Physical Exam  Constitutional: She is  oriented to person, place, and time. She appears well-developed and well-nourished. No distress.  HENT:  Head: Normocephalic and atraumatic.  Right Ear: External ear normal.  Left Ear: External ear normal.  Mouth/Throat: Oropharynx is clear and moist. No oropharyngeal exudate.       TMs normal  Eyes: Conjunctivae and EOM are normal. Pupils are equal, round, and reactive to light.       Fundi benign  Neck: Normal range of motion. Neck supple. Thyromegaly present.       Smooth mild enlargement of thyroid without any nodules  Cardiovascular: Normal rate, regular rhythm, normal heart sounds and intact distal pulses.  Exam reveals no gallop.   No murmur heard. Pulmonary/Chest:  Effort normal and breath sounds normal. No respiratory distress. She has no wheezes. She has no rales.  Abdominal: Soft. She exhibits no mass. There is no tenderness.  Musculoskeletal: Normal range of motion. She exhibits no edema and no tenderness.  Lymphadenopathy:    She has no cervical adenopathy.  Neurological: She is alert and oriented to person, place, and time. She exhibits normal muscle tone.       Normal strength and gait  Skin: Skin is warm. No rash noted.  Psychiatric: She has a normal mood and affect. Her behavior is normal. Judgment and thought content normal.          Assessment & Plan:

## 2011-01-15 NOTE — Patient Instructions (Signed)
Please try only half of the blood pressure pill Try taking the trazodone 1-2 hours before bedtime

## 2011-01-15 NOTE — Assessment & Plan Note (Signed)
Healthy but needs to work on fitness Pap and mammo through gyn Colon due 2014

## 2011-05-30 ENCOUNTER — Ambulatory Visit: Payer: Self-pay | Admitting: Obstetrics and Gynecology

## 2011-06-21 ENCOUNTER — Ambulatory Visit: Payer: Self-pay | Admitting: Surgery

## 2011-07-08 ENCOUNTER — Ambulatory Visit: Payer: Self-pay | Admitting: Surgery

## 2011-07-08 DIAGNOSIS — I10 Essential (primary) hypertension: Secondary | ICD-10-CM

## 2011-07-13 HISTORY — PX: THYROIDECTOMY: SHX17

## 2011-07-15 ENCOUNTER — Ambulatory Visit: Payer: Self-pay | Admitting: Surgery

## 2011-08-28 ENCOUNTER — Ambulatory Visit: Payer: Self-pay | Admitting: Obstetrics and Gynecology

## 2011-09-02 ENCOUNTER — Other Ambulatory Visit: Payer: Self-pay | Admitting: *Deleted

## 2011-09-02 MED ORDER — LISINOPRIL-HYDROCHLOROTHIAZIDE 10-12.5 MG PO TABS: 0.5000 | ORAL_TABLET | Freq: Every day | ORAL | Status: DC

## 2011-09-13 HISTORY — PX: OTHER SURGICAL HISTORY: SHX169

## 2011-10-10 ENCOUNTER — Other Ambulatory Visit: Payer: Self-pay | Admitting: *Deleted

## 2011-10-10 MED ORDER — TRAZODONE HCL 50 MG PO TABS: 100.0000 mg | ORAL_TABLET | Freq: Every day | ORAL | Status: DC

## 2011-10-10 NOTE — Telephone Encounter (Signed)
I think she takes 2 per night  #180 x 3

## 2011-10-10 NOTE — Telephone Encounter (Signed)
rx sent to pharmacy by e-script  

## 2011-10-14 ENCOUNTER — Encounter: Payer: Self-pay | Admitting: Internal Medicine

## 2011-10-17 ENCOUNTER — Ambulatory Visit: Payer: Self-pay | Admitting: Anesthesiology

## 2011-10-17 LAB — POTASSIUM: Potassium: 3.8 mmol/L (ref 3.5–5.1)

## 2011-10-22 ENCOUNTER — Ambulatory Visit: Payer: Self-pay | Admitting: Unknown Physician Specialty

## 2012-06-23 ENCOUNTER — Ambulatory Visit: Payer: 59 | Admitting: Internal Medicine

## 2012-07-02 ENCOUNTER — Ambulatory Visit: Payer: Self-pay | Admitting: Internal Medicine

## 2012-07-21 ENCOUNTER — Telehealth: Payer: Self-pay

## 2012-07-21 ENCOUNTER — Ambulatory Visit: Payer: Self-pay | Admitting: Internal Medicine

## 2012-07-21 ENCOUNTER — Encounter: Payer: Self-pay | Admitting: Internal Medicine

## 2012-07-21 LAB — CBC CANCER CENTER
Basophil #: 0 x10 3/mm (ref 0.0–0.1)
Basophil %: 0 %
HCT: 38.2 % (ref 35.0–47.0)
HGB: 12.5 g/dL (ref 12.0–16.0)
Lymphocyte #: 1.4 x10 3/mm (ref 1.0–3.6)
Lymphocyte %: 40.9 %
Lymphocytes: 48 %
MCH: 24.5 pg — ABNORMAL LOW (ref 26.0–34.0)
MCV: 75 fL — ABNORMAL LOW (ref 80–100)
Metamyelocyte: 1 %
Monocyte #: 0.4 x10 3/mm (ref 0.2–0.9)
Monocyte %: 13.4 %
Monocytes: 6 %
Neutrophil #: 1.5 x10 3/mm (ref 1.4–6.5)
RDW: 15.4 % — ABNORMAL HIGH (ref 11.5–14.5)
WBC: 3.3 x10 3/mm — ABNORMAL LOW (ref 3.6–11.0)

## 2012-07-21 LAB — HEPATIC FUNCTION PANEL A (ARMC)
Alkaline Phosphatase: 112 U/L (ref 50–136)
Bilirubin, Direct: 0.1 mg/dL (ref 0.00–0.20)
Bilirubin,Total: 0.4 mg/dL (ref 0.2–1.0)
Total Protein: 7.6 g/dL (ref 6.4–8.2)

## 2012-07-21 LAB — IRON AND TIBC
Iron Bind.Cap.(Total): 515 ug/dL — ABNORMAL HIGH (ref 250–450)
Iron: 39 ug/dL — ABNORMAL LOW (ref 50–170)

## 2012-07-21 LAB — RETICULOCYTES: Reticulocyte: 1.35 % (ref 0.5–2.2)

## 2012-07-21 LAB — CREATININE, SERUM
Creatinine: 0.85 mg/dL (ref 0.60–1.30)
EGFR (African American): >60
EGFR (Non-African Amer.): >60

## 2012-07-21 LAB — LACTATE DEHYDROGENASE: LDH: 194 U/L (ref 81–246)

## 2012-07-21 NOTE — Telephone Encounter (Signed)
Sherry at Dr Pandit's office; pt seen today and pt seen for low white count; and hx of anemia and B 12 deficient. Pt got Vit B12 shot today; will need B 12 on 07/28/12. Pt will need another B 12 on week 08/04/12 thru 08/07/12  And then need one more before getting monthly injections. Sherry request call back to get appt set up for injections. Pt prefers to get at Dr Karle Starch office.Please advise.

## 2012-07-22 NOTE — Telephone Encounter (Signed)
Please schedule these as noted  Thanks

## 2012-07-23 NOTE — Telephone Encounter (Signed)
Spoke with patient and she got a b-12 yesterday, pt states she won't start getting b-12's here until 2014, she did schedule appt for med refill for her thyroid.

## 2012-07-27 ENCOUNTER — Ambulatory Visit (INDEPENDENT_AMBULATORY_CARE_PROVIDER_SITE_OTHER): Payer: 59 | Admitting: Internal Medicine

## 2012-07-27 ENCOUNTER — Encounter: Payer: Self-pay | Admitting: Internal Medicine

## 2012-07-27 VITALS — BP 108/70 | HR 77 | Temp 98.3°F | Wt 192.0 lb

## 2012-07-27 DIAGNOSIS — E063 Autoimmune thyroiditis: Secondary | ICD-10-CM

## 2012-07-27 DIAGNOSIS — E538 Deficiency of other specified B group vitamins: Secondary | ICD-10-CM | POA: Insufficient documentation

## 2012-07-27 DIAGNOSIS — G479 Sleep disorder, unspecified: Secondary | ICD-10-CM

## 2012-07-27 DIAGNOSIS — D649 Anemia, unspecified: Secondary | ICD-10-CM

## 2012-07-27 DIAGNOSIS — E039 Hypothyroidism, unspecified: Secondary | ICD-10-CM | POA: Insufficient documentation

## 2012-07-27 DIAGNOSIS — I10 Essential (primary) hypertension: Secondary | ICD-10-CM

## 2012-07-27 MED ORDER — LEVOTHYROXINE SODIUM 75 MCG PO TABS: 75.0000 ug | ORAL_TABLET | Freq: Every day | ORAL | Status: DC

## 2012-07-27 NOTE — Assessment & Plan Note (Signed)
BP Readings from Last 3 Encounters:  07/27/12 108/70  01/15/11 108/60  08/22/10 100/60   Good control No changes needed

## 2012-07-27 NOTE — Assessment & Plan Note (Signed)
Persists Some relief with the trazodone but has to limit the dose

## 2012-07-27 NOTE — Assessment & Plan Note (Signed)
Just had iron infusion She will have records sent for my review

## 2012-07-27 NOTE — Assessment & Plan Note (Signed)
On replacement now since surgery

## 2012-07-27 NOTE — Progress Notes (Signed)
Subjective:    Patient ID: Sharon Shepherd, female    DOB: 1951/06/16, 61 y.o.   MRN: [id number]  HPI Here for check up  Still sees Dr Arvella Merles for gyn care Has mammo scheduled for next month  Got IV iron therapy last week From [place]--?hematologist there?? Found to be B12 deficient and has started shots  Tries to stay gluten free Intestines have been okay  No chest pain No SOB-- feels she doesn't "breathe like I used to" Had been trying to walk regularly at lunch No edema No dizziness or syncope  Current Outpatient Prescriptions on File Prior to Visit  Medication Sig Dispense Refill  . aspirin 81 MG chewable tablet Chew 81 mg by mouth every other day.        . ferrous gluconate (FERGON) 325 MG tablet Take 325 mg by mouth daily with breakfast.        . levothyroxine (LEVOTHROID) 75 MCG tablet Take 75 mcg by mouth daily.      Marland Kitchen lisinopril-hydrochlorothiazide (PRINZIDE,ZESTORETIC) 10-12.5 MG per tablet Take 0.5 tablets by mouth daily.  90 tablet  3  . traZODone (DESYREL) 50 MG tablet Take 2 tablets (100 mg total) by mouth at bedtime.  180 tablet  3  . venlafaxine XR (EFFEXOR-XR) 75 MG 24 hr capsule Take 75 mg by mouth daily.       . vitamin B-12 (CYANOCOBALAMIN) 1000 MCG tablet Take 1,000 mcg by mouth daily.          Allergies  Allergen Reactions  . Codeine     REACTION: Itching    Past Medical History  Diagnosis Date  . Anemia   . Hypertension   . Diverticulitis   . Celiac disease   . Sleep disturbance   . Vitamin B12 deficiency     unresponsive to oral replacement    Past Surgical History  Procedure Date  . Esophagogastroduodenoscopy 03/04    colon  . Thyroidectomy 12/12    Lymphocytic thyroiditis with goiter--Dr Renda Rolls  . Laryngeal implant 2/13    after damage during thyroidectomy    Family History  Problem Relation Age of Onset  . Stroke Father     History   Social History  . Marital Status: Married    Spouse Name: N/A    Number of Children: 2   . 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 Smoking status: Never Smoker   . Smokeless tobacco: Never Used  . Alcohol Use: Yes     Comment: rare  . Drug Use: Not on file  . Sexually Active: Not on file   Other Topics Concern  . Not on file   Social History Narrative  . No narrative on file   Review of Systems Sleep is still not great. Trazodone does help some (50mg  only or too tired in AM) Appetite is fine Weight fairly stable    Objective:   Physical Exam  Constitutional: She appears well-developed and well-nourished. No distress.  Neck: Normal range of motion. Neck supple. No thyromegaly present.  Cardiovascular: Normal rate, regular rhythm, normal heart sounds and intact distal pulses.  Exam reveals no gallop.   No murmur heard. Pulmonary/Chest: Effort normal and breath sounds normal. No respiratory distress. She has no wheezes. She has no rales.  Abdominal: Soft. There is no tenderness.  Musculoskeletal: She exhibits no edema and no tenderness.  Lymphadenopathy:    She has no cervical adenopathy.  Psychiatric: She has a normal mood and affect. Her behavior is normal.          Assessment & Plan:

## 2012-07-27 NOTE — Patient Instructions (Signed)
Please set up vitamin B12 injection ---due in about 1 month

## 2012-07-28 LAB — BASIC METABOLIC PANEL
BUN/Creatinine Ratio: 17 (ref 9–23)
CO2: 23 mmol/L (ref 19–28)
Creatinine, Ser: 0.69 mg/dL (ref 0.57–1.00)
Sodium: 139 mmol/L (ref 134–144)

## 2012-07-28 LAB — HEPATIC FUNCTION PANEL
Albumin: 4.4 g/dL (ref 3.5–5.5)
Total Bilirubin: 0.4 mg/dL (ref 0.0–1.2)
Total Protein: 6.9 g/dL (ref 6.0–8.5)

## 2012-07-28 LAB — LIPID PANEL
Chol/HDL Ratio: 4.3 ratio units (ref 0.0–4.4)
HDL: 50 mg/dL (ref >39)
LDL Calculated: 141 mg/dL — ABNORMAL HIGH (ref 0–99)
Triglycerides: 115 mg/dL (ref 0–149)
VLDL Cholesterol Cal: 23 mg/dL (ref 5–40)

## 2012-08-12 ENCOUNTER — Ambulatory Visit: Payer: Self-pay | Admitting: Internal Medicine

## 2012-08-17 ENCOUNTER — Ambulatory Visit: Payer: 59 | Admitting: Internal Medicine

## 2012-08-27 ENCOUNTER — Telehealth: Payer: Self-pay | Admitting: *Deleted

## 2012-08-27 ENCOUNTER — Encounter: Payer: Self-pay | Admitting: Internal Medicine

## 2012-08-27 ENCOUNTER — Ambulatory Visit (INDEPENDENT_AMBULATORY_CARE_PROVIDER_SITE_OTHER): Payer: 59 | Admitting: *Deleted

## 2012-08-27 DIAGNOSIS — E538 Deficiency of other specified B group vitamins: Secondary | ICD-10-CM

## 2012-08-27 MED ORDER — CYANOCOBALAMIN 1000 MCG/ML IJ SOLN
1000.0000 ug | Freq: Once | INTRAMUSCULAR | Status: AC
  Administered 2012-08-27: 1000 ug via INTRAMUSCULAR

## 2012-08-27 MED ORDER — CYANOCOBALAMIN 1000 MCG/ML IJ SOLN: 1000.0000 ug | INTRAMUSCULAR | Status: DC

## 2012-08-27 MED ORDER — "TUBERCULIN-ALLERGY SYRINGES 25G X 1"" 1 ML MISC": 1.0000 mL | Status: DC

## 2012-08-27 NOTE — Telephone Encounter (Signed)
Spoke with patient and advised results rx sent to pharmacy by e-script  

## 2012-08-27 NOTE — Telephone Encounter (Signed)
I did a nurse visit (b12) on your pt today and she wanted me to ask you if she could get a Rx for the b12 injections instead of coming up to office every month which is an inconvenience for pt, I let her know I would ask you and have [facility] call her back with answer

## 2012-08-27 NOTE — Telephone Encounter (Signed)
I am okay with sending Rx for B12 and syringes/needles if she has someone to do the injections for her

## 2012-08-28 ENCOUNTER — Encounter: Payer: Self-pay | Admitting: Internal Medicine

## 2012-08-28 ENCOUNTER — Telehealth: Payer: Self-pay | Admitting: Internal Medicine

## 2012-08-28 ENCOUNTER — Other Ambulatory Visit: Payer: Self-pay | Admitting: Internal Medicine

## 2012-08-28 MED ORDER — CYANOCOBALAMIN 1000 MCG/ML IJ SOLN: 1000.0000 ug | INTRAMUSCULAR | Status: DC

## 2012-08-28 MED ORDER — NEEDLES & SYRINGES MISC: Status: DC

## 2012-08-28 NOTE — Telephone Encounter (Signed)
Please send vitamin B12 prescription and syringes/needles to [facility] on [address]---- 1 year supply

## 2012-09-01 ENCOUNTER — Ambulatory Visit: Payer: Self-pay | Admitting: Obstetrics and Gynecology

## 2012-09-03 ENCOUNTER — Other Ambulatory Visit: Payer: Self-pay | Admitting: *Deleted

## 2012-09-03 MED ORDER — LEVOTHYROXINE SODIUM 75 MCG PO TABS: 75.0000 ug | ORAL_TABLET | Freq: Every day | ORAL | Status: DC

## 2012-09-26 ENCOUNTER — Other Ambulatory Visit: Payer: Self-pay

## 2013-01-27 ENCOUNTER — Other Ambulatory Visit: Payer: Self-pay | Admitting: Internal Medicine

## 2013-04-15 ENCOUNTER — Other Ambulatory Visit: Payer: Self-pay | Admitting: Internal Medicine

## 2013-04-15 MED ORDER — LISINOPRIL-HYDROCHLOROTHIAZIDE 10-12.5 MG PO TABS: 0.5000 | ORAL_TABLET | Freq: Every day | ORAL | Status: DC

## 2013-04-15 NOTE — Telephone Encounter (Signed)
rx sent to pharmacy by e-script  

## 2013-06-17 ENCOUNTER — Other Ambulatory Visit: Payer: Self-pay

## 2013-07-27 ENCOUNTER — Encounter: Payer: Self-pay | Admitting: Gastroenterology

## 2013-07-27 ENCOUNTER — Ambulatory Visit (INDEPENDENT_AMBULATORY_CARE_PROVIDER_SITE_OTHER): Payer: 59 | Admitting: Internal Medicine

## 2013-07-27 ENCOUNTER — Encounter: Payer: Self-pay | Admitting: Internal Medicine

## 2013-07-27 VITALS — BP 120/70 | HR 71 | Temp 98.6°F | Ht 66.0 in | Wt 201.0 lb

## 2013-07-27 DIAGNOSIS — Z0000 Encounter for general adult medical examination without abnormal findings: Secondary | ICD-10-CM

## 2013-07-27 DIAGNOSIS — Z1211 Encounter for screening for malignant neoplasm of colon: Secondary | ICD-10-CM

## 2013-07-27 DIAGNOSIS — E785 Hyperlipidemia, unspecified: Secondary | ICD-10-CM | POA: Insufficient documentation

## 2013-07-27 DIAGNOSIS — I10 Essential (primary) hypertension: Secondary | ICD-10-CM

## 2013-07-27 MED ORDER — ATORVASTATIN CALCIUM 20 MG PO TABS: 20.0000 mg | ORAL_TABLET | Freq: Every day | ORAL | Status: DC

## 2013-07-27 MED ORDER — "TUBERCULIN-ALLERGY SYRINGES 25G X 1"" 1 ML MISC": 1.0000 mL | Status: DC

## 2013-07-27 MED ORDER — CYANOCOBALAMIN 1000 MCG/ML IJ SOLN: 1000.0000 ug | INTRAMUSCULAR | Status: DC

## 2013-07-27 NOTE — Progress Notes (Signed)
Subjective:    Patient ID: Sharon Shepherd, female    DOB: [birth year], 62 y.o.   MRN: [id number]  HPI Here for physical Still sees Dr Romelle Starcher for gyn care Had mammo and pap Due for colonoscopy  Weight is up 9# Discussed fitness and diet Discussed primary prevention for cholesterol--she is unsure  Current Outpatient Prescriptions on File Prior to Visit  Medication Sig Dispense Refill  . aspirin 81 MG chewable tablet Chew 81 mg by mouth every other day.        . ferrous gluconate (FERGON) 325 MG tablet Take 325 mg by mouth daily with breakfast.        . levothyroxine (SYNTHROID, LEVOTHROID) 75 MCG tablet Take 1 tablet (75 mcg  total) by mouth daily  90 tablet  0  . lisinopril-hydrochlorothiazide (PRINZIDE,ZESTORETIC) 10-12.5 MG per tablet Take one-half tablet by  mouth daily  45 tablet  3  . traZODone (DESYREL) 50 MG tablet Take 2 tablets by mouth  every night at bedtime  180 tablet  0  . venlafaxine XR (EFFEXOR-XR) 75 MG 24 hr capsule Take 75 mg by mouth daily.        No current facility-administered medications on file prior to visit.    Allergies  Allergen Reactions  . Codeine     REACTION: Itching    Past Medical History  Diagnosis Date  . Anemia   . Hypertension   . Diverticulitis   . Celiac disease   . Sleep disturbance   . Vitamin B12 deficiency     unresponsive to oral replacement  . Thyroiditis, lymphocytic     surgery 2013    Past Surgical History  Procedure Laterality Date  . Esophagogastroduodenoscopy  03/04    colon  . Thyroidectomy  12/12    Lymphocytic thyroiditis with goiter--Dr Rochel Brome  . Laryngeal implant  2/13    after damage during thyroidectomy    Family History  Problem Relation Age of Onset  . Stroke Father     History   Social History  . Marital Status: Married    Spouse Name: N/A    Number of Children: 2  . 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 Smoking status:  Never Smoker   . Smokeless tobacco: Never Used  . Alcohol Use: Yes     Comment: rare  . Drug Use: Not on file  . Sexual Activity: Not on file   Other Topics Concern  . Not on file   Social History Narrative  . No narrative on file   Review of Systems  Constitutional: Positive for unexpected weight change. Negative for fatigue.       Wears seat belt  HENT: Negative for congestion, dental problem, hearing loss, rhinorrhea and tinnitus.        Regular with dentist  Eyes: Negative for visual disturbance.       No diplopia or unilateral vision loss  Respiratory: Positive for cough and shortness of breath. Negative for chest tightness.        Does note DOE Has had mild cough since thyroid surgery  Cardiovascular: Positive for chest pain. Negative for palpitations and leg swelling.       Gets some twinges of sharp upper chest pain-- no particular time (very brief)  Gastrointestinal: Positive for constipation. Negative for nausea, vomiting, abdominal pain and blood in stool.       Chronic constipation No heartburn but  does have some reflux  Endocrine: Positive for heat intolerance. Negative for cold intolerance.  Genitourinary: Negative for dysuria, hematuria, difficulty urinating and dyspareunia.       Rare stress incontinence--no pad  Musculoskeletal: Negative for arthralgias, back pain and joint swelling.  Skin: Negative for rash.       No suspicious lesions --- went for first derm visit  Allergic/Immunologic: Negative for environmental allergies and immunocompromised state.  Neurological: Positive for headaches. Negative for dizziness, syncope and light-headedness.       Rare headaches  Hematological: Negative for adenopathy. Bruises/bleeds easily.  Psychiatric/Behavioral: Positive for sleep disturbance. Negative for dysphoric mood. The patient is not nervous/anxious.        Chronic sleep problems-- snores but no apnea Trazodone does help       Objective:   Physical Exam    Constitutional: She is oriented to person, place, and time. She appears well-developed and well-nourished. No distress.  HENT:  Head: Normocephalic and atraumatic.  Right Ear: External ear normal.  Left Ear: External ear normal.  Mouth/Throat: Oropharynx is clear and moist. No oropharyngeal exudate.  Eyes: Conjunctivae and EOM are normal. Pupils are equal, round, and reactive to light.  Neck: Normal range of motion. Neck supple. No thyromegaly present.  Cardiovascular: Normal rate, regular rhythm, normal heart sounds and intact distal pulses.  Exam reveals no gallop.   No murmur heard. Pulmonary/Chest: Effort normal and breath sounds normal. No respiratory distress. She has no wheezes. She has no rales.  Abdominal: Soft. There is no tenderness.  Musculoskeletal: She exhibits no edema and no tenderness.  Lymphadenopathy:    She has no cervical adenopathy.  Neurological: She is alert and oriented to person, place, and time.  Skin: No rash noted. No erythema.  Psychiatric: She has a normal mood and affect. Her behavior is normal.          Assessment & Plan:

## 2013-07-27 NOTE — Assessment & Plan Note (Signed)
BP Readings from Last 3 Encounters:  07/27/13 120/70  07/27/12 108/70  01/15/11 108/60   Good control No changes needed

## 2013-07-27 NOTE — Assessment & Plan Note (Signed)
Discussed primary prevention She wants to start statin due to family history

## 2013-07-27 NOTE — Progress Notes (Signed)
Pre-visit discussion using our clinic review tool. No additional management support is needed unless otherwise documented below in the visit note.  

## 2013-07-27 NOTE — Assessment & Plan Note (Signed)
Really needs to work on fitness--info given Direct colonoscopy

## 2013-07-27 NOTE — Patient Instructions (Addendum)
You can try miralax (polyethylene glycol) daily---1 capful in water -- to prevent the constipation Please set up blood work in about 6-8 weeks---- lipid, hepatic (272.4)  Exercise to Lose Weight Exercise and a healthy diet may help you lose weight. Your doctor may suggest specific exercises. EXERCISE IDEAS AND TIPS  Choose low-cost things you enjoy doing, such as walking, bicycling, or exercising to workout videos.  Take stairs instead of the elevator.  Walk during your lunch break.  Park your car further away from work or school.  Go to a gym or an exercise class.  Start with 5 to 10 minutes of exercise each day. Build up to 30 minutes of exercise 4 to 6 days a week.  Wear shoes with good support and comfortable clothes.  Stretch before and after working out.  Work out until you breathe harder and your heart beats faster.  Drink extra water when you exercise.  Do not do so much that you hurt yourself, feel dizzy, or get very short of breath. Exercises that burn about 150 calories:  Running 1  miles in 15 minutes.  Playing volleyball for 45 to 60 minutes.  Washing and waxing a car for 45 to 60 minutes.  Playing touch football for 45 minutes.  Walking 1  miles in 35 minutes.  Pushing a stroller 1  miles in 30 minutes.  Playing basketball for 30 minutes.  Raking leaves for 30 minutes.  Bicycling 5 miles in 30 minutes.  Walking 2 miles in 30 minutes.  Dancing for 30 minutes.  Shoveling snow for 15 minutes.  Swimming laps for 20 minutes.  Walking up stairs for 15 minutes.  Bicycling 4 miles in 15 minutes.  Gardening for 30 to 45 minutes.  Jumping rope for 15 minutes.  Washing windows or floors for 45 to 60 minutes. Document Released: 08/31/2010 Document Revised: 10/21/2011 Document Reviewed: 08/31/2010 [facility] Patient Information 2014 [place], [place]. DASH Diet The DASH diet stands for "Dietary Approaches to Stop Hypertension." It is a healthy  eating plan that has been shown to reduce high blood pressure (hypertension) in as little as 14 days, while also possibly providing other significant health benefits. These other health benefits include reducing the risk of breast cancer after menopause and reducing the risk of type 2 diabetes, heart disease, colon cancer, and stroke. Health benefits also include weight loss and slowing kidney failure in patients with chronic kidney disease.  DIET GUIDELINES  Limit salt (sodium). Your diet should contain less than 1500 mg of sodium daily.  Limit refined or processed carbohydrates. Your diet should include mostly whole grains. Desserts and added sugars should be used sparingly.  Include small amounts of heart-healthy fats. These types of fats include nuts, oils, and tub margarine. Limit saturated and trans fats. These fats have been shown to be harmful in the body. CHOOSING FOODS  The following food groups are based on a 2000 calorie diet. See your Registered Dietitian for individual calorie needs. Grains and Grain Products (6 to 8 servings daily)  Eat More Often: Whole-wheat bread, brown rice, whole-grain or wheat pasta, quinoa, popcorn without added fat or salt (air popped).  Eat Less Often: White bread, white pasta, white rice, cornbread. Vegetables (4 to 5 servings daily)  Eat More Often: Fresh, frozen, and canned vegetables. Vegetables may be raw, steamed, roasted, or grilled with a minimal amount of fat.  Eat Less Often/Avoid: Creamed or fried vegetables. Vegetables in a cheese sauce. Fruit (4 to 5 servings daily)  Eat More Often: All fresh, canned (in natural juice), or frozen fruits. Dried fruits without added sugar. One hundred percent fruit juice ( cup [237 mL] daily).  Eat Less Often: Dried fruits with added sugar. Canned fruit in light or heavy syrup. [employer], Fish, and Poultry (2 servings or less daily. One serving is 3 to 4 oz [85-114 g]).  Eat More Often: Ninety percent  or leaner ground beef, tenderloin, sirloin. Round cuts of beef, chicken breast, [place] breast. All fish. Grill, bake, or broil your meat. Nothing should be fried.  Eat Less Often/Avoid: Fatty cuts of meat, [place], or chicken leg, thigh, or wing. Fried cuts of meat or fish. Dairy (2 to 3 servings)  Eat More Often: Low-fat or fat-free milk, low-fat plain or light yogurt, reduced-fat or part-skim cheese.  Eat Less Often/Avoid: Milk (whole, 2%).Whole milk yogurt. Full-fat cheeses. Nuts, Seeds, and Legumes (4 to 5 servings per week)  Eat More Often: All without added salt.  Eat Less Often/Avoid: Salted nuts and seeds, canned beans with added salt. Fats and Sweets (limited)  Eat More Often: Vegetable oils, tub margarines without trans fats, sugar-free gelatin. Mayonnaise and salad dressings.  Eat Less Often/Avoid: Coconut oils, palm oils, butter, stick margarine, cream, half and half, cookies, candy, pie. FOR MORE INFORMATION The Dash Diet Eating Plan: www.dashdiet.org Document Released: 07/18/2011 Document Revised: 10/21/2011 Document Reviewed: 07/18/2011 [facility] Patient Information 2014 [place], [place].

## 2013-07-28 LAB — T4, FREE: Free T4: 0.95 ng/dL (ref 0.82–1.77)

## 2013-07-28 LAB — BASIC METABOLIC PANEL
BUN/Creatinine Ratio: 16 (ref 11–26)
BUN: 12 mg/dL (ref 8–27)
Chloride: 101 mmol/L (ref 97–108)
GFR calc Af Amer: 99 mL/min/{1.73_m2} (ref >59)
Sodium: 143 mmol/L (ref 134–144)

## 2013-07-28 LAB — CBC WITH DIFFERENTIAL/PLATELET
Basophils Absolute: 0 10*3/uL (ref 0.0–0.2)
Basos: 0 %
Eosinophils Absolute: 0 10*3/uL (ref 0.0–0.4)
Immature Grans (Abs): 0 10*3/uL (ref 0.0–0.1)
Lymphocytes Absolute: 1.4 10*3/uL (ref 0.7–3.1)
Lymphs: 38 %
MCH: 28.4 pg (ref 26.6–33.0)
MCHC: 34 g/dL (ref 31.5–35.7)
Neutrophils Relative %: 49 %
RBC: 5.17 x10E6/uL (ref 3.77–5.28)
RDW: 13.6 % (ref 12.3–15.4)

## 2013-07-28 LAB — TSH: TSH: 0.832 u[IU]/mL (ref 0.450–4.500)

## 2013-07-28 LAB — LIPID PANEL
Cholesterol, Total: 216 mg/dL — ABNORMAL HIGH (ref 100–199)
Triglycerides: 117 mg/dL (ref 0–149)
VLDL Cholesterol Cal: 23 mg/dL (ref 5–40)

## 2013-07-28 LAB — HEPATIC FUNCTION PANEL
ALT: 24 IU/L (ref 0–32)
Alkaline Phosphatase: 90 IU/L (ref 39–117)

## 2013-08-03 ENCOUNTER — Telehealth: Payer: Self-pay

## 2013-08-03 MED ORDER — AMOXICILLIN 500 MG PO TABS: 1000.0000 mg | ORAL_TABLET | Freq: Two times a day (BID) | ORAL | Status: DC

## 2013-08-03 NOTE — Telephone Encounter (Signed)
Let her know I sent in the antibiotic prescription

## 2013-08-03 NOTE — Telephone Encounter (Signed)
Spoke with patient and advised results   

## 2013-08-03 NOTE — Telephone Encounter (Signed)
Pt left v/m was seen 07/27/13 and pt request antibiotic to [facility] [address]. Pt had sinus infection when seen on 07/27/13 that had just begun; now pt has green mucus when blows nose; S/T, scratchy and irritated throat and drainage at back of throat, non prod cough. No fever. Pt request antibiotic.Please advise.

## 2013-09-17 ENCOUNTER — Other Ambulatory Visit (INDEPENDENT_AMBULATORY_CARE_PROVIDER_SITE_OTHER): Payer: 59

## 2013-09-17 DIAGNOSIS — E785 Hyperlipidemia, unspecified: Secondary | ICD-10-CM

## 2013-09-18 LAB — LIPID PANEL
CHOL/HDL RATIO: 2.7 ratio (ref 0.0–4.4)
CHOLESTEROL TOTAL: 128 mg/dL (ref 100–199)
HDL: 47 mg/dL (ref >39)
LDL Calculated: 62 mg/dL (ref 0–99)
Triglycerides: 96 mg/dL (ref 0–149)
VLDL CHOLESTEROL CAL: 19 mg/dL (ref 5–40)

## 2013-09-18 LAB — HEPATIC FUNCTION PANEL
ALBUMIN: 4.4 g/dL (ref 3.6–4.8)
ALK PHOS: 99 IU/L (ref 39–117)
ALT: 31 IU/L (ref 0–32)
AST: 29 IU/L (ref 0–40)
BILIRUBIN DIRECT: 0.16 mg/dL (ref 0.00–0.40)
BILIRUBIN TOTAL: 0.5 mg/dL (ref 0.0–1.2)
TOTAL PROTEIN: 6.5 g/dL (ref 6.0–8.5)

## 2013-09-24 ENCOUNTER — Encounter: Payer: 59 | Admitting: Gastroenterology

## 2013-10-15 ENCOUNTER — Ambulatory Visit (AMBULATORY_SURGERY_CENTER): Payer: 59 | Admitting: *Deleted

## 2013-10-15 VITALS — Ht 66.0 in | Wt 205.8 lb

## 2013-10-15 DIAGNOSIS — Z1211 Encounter for screening for malignant neoplasm of colon: Secondary | ICD-10-CM

## 2013-10-15 MED ORDER — NA SULFATE-K SULFATE-MG SULF 17.5-3.13-1.6 GM/177ML PO SOLN: 1.0000 | Freq: Once | ORAL | Status: DC

## 2013-10-15 NOTE — Progress Notes (Signed)
No egg or soy allergy. No anesthesia problems.  

## 2013-10-21 ENCOUNTER — Other Ambulatory Visit: Payer: Self-pay | Admitting: Internal Medicine

## 2013-10-21 NOTE — Telephone Encounter (Signed)
Dr. Silvio Pate out of office, ok to refill?

## 2013-10-22 ENCOUNTER — Ambulatory Visit (AMBULATORY_SURGERY_CENTER): Payer: 59 | Admitting: Internal Medicine

## 2013-10-22 ENCOUNTER — Encounter: Payer: Self-pay | Admitting: Internal Medicine

## 2013-10-22 VITALS — BP 116/77 | HR 61 | Temp 98.2°F | Resp 21 | Ht 66.0 in | Wt 205.0 lb

## 2013-10-22 DIAGNOSIS — K900 Celiac disease: Secondary | ICD-10-CM

## 2013-10-22 DIAGNOSIS — Z1211 Encounter for screening for malignant neoplasm of colon: Secondary | ICD-10-CM

## 2013-10-22 DIAGNOSIS — K5730 Diverticulosis of large intestine without perforation or abscess without bleeding: Secondary | ICD-10-CM

## 2013-10-22 MED ORDER — SODIUM CHLORIDE 0.9 % IV SOLN: 500.0000 mL | INTRAVENOUS | Status: DC

## 2013-10-22 NOTE — Patient Instructions (Addendum)
No polyps or cancer seen! You do have diverticulosis - thickened muscle rings and pouches in the colon wall. Please read the handout about this condition.  Next routine colonoscopy in 10 years - 2025  I recommend you make an appointment in the office to review the celiac disease and at least do blood tests to check the celiac antibodies unless you have been doing this with someone else.  I appreciate the opportunity to care for you. Gatha Mayer, MD, FACG  YOU HAD AN ENDOSCOPIC PROCEDURE TODAY AT [facility]: Refer to the procedure report that was given to you for any specific questions about what was found during the examination.  If the procedure report does not answer your questions, please call your gastroenterologist to clarify.  If you requested that your care partner not be given the details of your procedure findings, then the procedure report has been included in a sealed envelope for you to review at your convenience later.  YOU SHOULD EXPECT: Some feelings of bloating in the abdomen. Passage of more gas than usual.  Walking can help get rid of the air that was put into your GI tract during the procedure and reduce the bloating. If you had a lower endoscopy (such as a colonoscopy or flexible sigmoidoscopy) you may notice spotting of blood in your stool or on the toilet paper. If you underwent a bowel prep for your procedure, then you may not have a normal bowel movement for a few days.  DIET: Your first meal following the procedure should be a light meal and then it is ok to progress to your normal diet.  A half-sandwich or bowl of soup is an example of a good first meal.  Heavy or fried foods are harder to digest and may make you feel nauseous or bloated.  Likewise meals heavy in dairy and vegetables can cause extra gas to form and this can also increase the bloating.  Drink plenty of fluids but you should avoid alcoholic beverages for 24 hours.  ACTIVITY: Your care  partner should take you home directly after the procedure.  You should plan to take it easy, moving slowly for the rest of the day.  You can resume normal activity the day after the procedure however you should NOT DRIVE or use heavy machinery for 24 hours (because of the sedation medicines used during the test).    SYMPTOMS TO REPORT IMMEDIATELY: A gastroenterologist can be reached at any hour.  During normal business hours, 8:30 AM to 5:00 PM Monday through Friday, call [phone].  After hours and on weekends, please call the GI answering service at [phone] who will take a message and have the physician on call contact you.   Following lower endoscopy (colonoscopy or flexible sigmoidoscopy):  Excessive amounts of blood in the stool  Significant tenderness or worsening of abdominal pains  Swelling of the abdomen that is new, acute  Fever of 100F or higher   FOLLOW UP: If any biopsies were taken you will be contacted by phone or by letter within the next 1-3 weeks.  Call your gastroenterologist if you have not heard about the biopsies in 3 weeks.  Our staff will call the home number listed on your records the next business day following your procedure to check on you and address any questions or concerns that you may have at that time regarding the information given to you following your procedure. This is a courtesy call and so  if there is no answer at the home number and we have not heard from you through the emergency physician on call, we will assume that you have returned to your regular daily activities without incident.  SIGNATURES/CONFIDENTIALITY: You and/or your care partner have signed paperwork which will be entered into your electronic medical record.  These signatures attest to the fact that that the information above on your After Visit Summary has been reviewed and is understood.  Full responsibility of the confidentiality of this discharge information lies with you  and/or your care-partner.

## 2013-10-22 NOTE — Progress Notes (Signed)
  [facility] Post-op Note  Patient: Sharon Shepherd  Procedure(s) Performed: colonoscopy  Patient Location: [facility] - Recovery Area  Anesthesia Type: Deep Sedation/Propofol  Level of Consciousness: awake, oriented and patient cooperative  Airway and Oxygen Therapy: Patient Spontanous Breathing  Post-op Pain: none  Post-op Assessment:  Post-op Vital signs reviewed, Patient's Cardiovascular Status Stable, Respiratory Function Stable, Patent Airway, No signs of Nausea or vomiting and Pain level controlled  Post-op Vital Signs: Reviewed and stable  Complications: No apparent anesthesia complications  Harland Aguiniga E 2:33 PM

## 2013-10-22 NOTE — Op Note (Signed)
[facility]  [address] [place], [id number]   COLONOSCOPY PROCEDURE REPORT  PATIENT: Sharon Shepherd, Sharon Shepherd  MR#: [id number] BIRTHDATE: Apr 14, 1951 , 62  yrs. old GENDER: Female ENDOSCOPIST: Gatha Mayer, MD, [facility] PROCEDURE DATE:  10/22/2013 PROCEDURE:   Colonoscopy, screening First Screening Colonoscopy - Avg.  risk and is 50 yrs.  old or older - No.  Prior Negative Screening - Now for repeat screening. 10 or more years since last screening  History of Adenoma - Now for follow-up colonoscopy & has been > or = to 3 yrs.  N/A  Polyps Removed Today? No.  Recommend repeat exam, <10 yrs? No. ASA CLASS:   Class II INDICATIONS:average risk screening and Last colonoscopy performed 2004. MEDICATIONS: propofol (Diprivan) 200mg  IV, MAC sedation, administered by CRNA, and These medications were titrated to patient response per physician's verbal order  DESCRIPTION OF PROCEDURE:   After the risks benefits and alternatives of the procedure were thoroughly explained, informed consent was obtained.  A digital rectal exam revealed no abnormalities of the rectum.   The LB [id number]  endoscope was introduced through the anus and advanced to the cecum, which was identified by both the appendix and ileocecal valve. No adverse events experienced.   The quality of the prep was excellent using Suprep  The instrument was then slowly withdrawn as the colon was fully examined.  COLON FINDINGS: Severe diverticulosis was noted The finding was in the left colon.   The colon mucosa was otherwise normal.   A right colon retroflexion was performed.  Retroflexed views revealed no abnormalities. The time to cecum=4 minutes 18 seconds.  Withdrawal time=8 minutes 43 seconds.  The scope was withdrawn and the procedure completed. COMPLICATIONS: There were no complications.  ENDOSCOPIC IMPRESSION: 1.   Severe diverticulosis was noted in the left colon 2.   The colon mucosa was otherwise  normal  RECOMMENDATIONS: 1.  Repeat colonoscopy 10 years. 2.   I recommend she make an appointment for office visit to review celiac disease status unless that is done elsewhere   eSigned:  Gatha Mayer, MD, Mar[facility] Regal 10/22/2013 2:36 PM   cc: [name], MD and The Patient

## 2013-10-23 ENCOUNTER — Other Ambulatory Visit: Payer: Self-pay | Admitting: Internal Medicine

## 2013-10-25 ENCOUNTER — Telehealth: Payer: Self-pay | Admitting: *Deleted

## 2013-10-25 NOTE — Telephone Encounter (Signed)
  Follow up Call-  Call back number 10/22/2013  Post procedure Call Back phone  # [phone]  Permission to leave phone message Yes     Patient questions:  Do you have a fever, pain , or abdominal swelling? no Pain Score  0 *  Have you tolerated food without any problems? yes  Have you been able to return to your normal activities? yes  Do you have any questions about your discharge instructions: Diet   no Medications  no Follow up visit  no  Do you have questions or concerns about your Care? no  Actions: * If pain score is 4 or above: No action needed, pain <4.

## 2014-01-07 ENCOUNTER — Encounter: Payer: Self-pay | Admitting: Internal Medicine

## 2014-01-07 ENCOUNTER — Ambulatory Visit (INDEPENDENT_AMBULATORY_CARE_PROVIDER_SITE_OTHER): Payer: 59 | Admitting: Internal Medicine

## 2014-01-07 VITALS — BP 100/70 | HR 81 | Temp 98.1°F | Wt 207.0 lb

## 2014-01-07 DIAGNOSIS — S298XXA Other specified injuries of thorax, initial encounter: Secondary | ICD-10-CM | POA: Insufficient documentation

## 2014-01-07 DIAGNOSIS — S20219A Contusion of unspecified front wall of thorax, initial encounter: Secondary | ICD-10-CM

## 2014-01-07 NOTE — Progress Notes (Signed)
Pre visit review using our clinic review tool, if applicable. No additional management support is needed unless otherwise documented below in the visit note. 

## 2014-01-07 NOTE — Assessment & Plan Note (Signed)
Reassured No signs of fracture Continue rib belt and ibuprofen Discussed possible extended symptoms

## 2014-01-07 NOTE — Progress Notes (Signed)
Subjective:    Patient ID: Sharon Shepherd, female    DOB: 04-21-1951, 63 y.o.   MRN: [id number]  HPI Thinks she may have hurt a right rib Pain for about 5 days Had been out trimming bushes-- pushing wheelbarrow and hit hole so walked into the handle Pain since then  Pain is moderate Hard to sleep---moving in bed can awaken her Burping hurts also  No nausea or vomiting Appetite is fine Bowels always slow--no change  No cough  No SOB  Tried ibuprofen 618m tid--does help some Hasn't had to miss work  Current Outpatient Prescriptions on File Prior to Visit  Medication Sig Dispense Refill  . aspirin 81 MG chewable tablet Chew 81 mg by mouth every other day.        .Marland Kitchenatorvastatin (LIPITOR) 20 MG tablet Take 1 tablet (20 mg total) by mouth daily.  90 tablet  3  . Cholecalciferol (VITAMIN D3) 5000 UNITS CAPS Take 1 tablet by mouth daily.      . cyanocobalamin (,VITAMIN B-12,) 1000 MCG/ML injection Inject 1 mL (1,000 mcg total) into the muscle every 30 (thirty) days.  3 mL  3  . ferrous gluconate (FERGON) 325 MG tablet Take 325 mg by mouth daily with breakfast.        . levothyroxine (SYNTHROID, LEVOTHROID) 75 MCG tablet Take 1 tablet (75 mcg   total) by mouth daily  90 tablet  2  . lisinopril-hydrochlorothiazide (PRINZIDE,ZESTORETIC) 10-12.5 MG per tablet Take one-half tablet by  mouth daily  45 tablet  3  . traZODone (DESYREL) 50 MG tablet Take 2 tablets by mouth   every night at bedtime  180 tablet  1  . Tuberculin-Allergy Syringes 25G X 1" 1 ML MISC Inject 1 mL as directed every 30 (thirty) days.  3 each  3  . venlafaxine XR (EFFEXOR-XR) 75 MG 24 hr capsule Take 75 mg by mouth daily.        No current facility-administered medications on file prior to visit.    Allergies  Allergen Reactions  . Codeine     REACTION: Itching    Past Medical History  Diagnosis Date  . Anemia   . Hypertension   . Diverticulitis   . Celiac disease   . Sleep disturbance   . Vitamin B12  deficiency     unresponsive to oral replacement  . Thyroiditis, lymphocytic     surgery 2013  . Hyperlipidemia   . GERD (gastroesophageal reflux disease)     Past Surgical History  Procedure Laterality Date  . Esophagogastroduodenoscopy  03/04    colon  . Thyroidectomy  12/12    Lymphocytic thyroiditis with goiter--Dr WRochel Brome . Laryngeal implant  2/13    after damage during thyroidectomy  . Colonoscopy      Family History  Problem Relation Age of Onset  . Stroke Father   . Colon cancer Neg Hx     History   Social History  . Marital Status: Married    Spouse Name: N/A    Number of Children: 2  . 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 Smoking status: Never Smoker   . Smokeless tobacco: Never Used  . Alcohol Use: Yes     Comment: rare  . Drug Use: No  . Sexual Activity: Not on file   Other Topics Concern  . Not on file   Social History Narrative  .  No narrative on file   Review of Systems No fever No swallowing problems No urinary symptoms     Objective:   Physical Exam  Constitutional: She appears well-developed and well-nourished. No distress.  Neck: Normal range of motion. Neck supple.  Cardiovascular: Normal rate and regular rhythm.  Exam reveals no friction rub.   Pulmonary/Chest: Effort normal and breath sounds normal. No respiratory distress. She has no wheezes. She has no rales. She exhibits tenderness.  Marked localized tenderness around costochondral junction right T10-11 or so  Abdominal: Soft. She exhibits no distension. There is no tenderness. There is no rebound and no guarding.          Assessment & Plan:

## 2014-01-15 ENCOUNTER — Other Ambulatory Visit: Payer: Self-pay | Admitting: Internal Medicine

## 2014-04-05 ENCOUNTER — Ambulatory Visit (INDEPENDENT_AMBULATORY_CARE_PROVIDER_SITE_OTHER): Payer: 59 | Admitting: Internal Medicine

## 2014-04-05 ENCOUNTER — Encounter: Payer: Self-pay | Admitting: Internal Medicine

## 2014-04-05 VITALS — BP 130/80 | HR 74 | Temp 97.5°F | Wt 204.0 lb

## 2014-04-05 DIAGNOSIS — R0609 Other forms of dyspnea: Secondary | ICD-10-CM | POA: Insufficient documentation

## 2014-04-05 DIAGNOSIS — R0989 Other specified symptoms and signs involving the circulatory and respiratory systems: Secondary | ICD-10-CM

## 2014-04-05 NOTE — Progress Notes (Signed)
Pre visit review using our clinic review tool, if applicable. No additional management support is needed unless otherwise documented below in the visit note. 

## 2014-04-05 NOTE — Assessment & Plan Note (Addendum)
Fairly classic exertional symptoms EKG is normal Now affecting her confidence even to exercise Will go ahead and set up exercise treadmill test

## 2014-04-05 NOTE — Progress Notes (Signed)
Subjective:    Patient ID: Sharon Shepherd, female    DOB: 1950-10-13, 63 y.o.   MRN: [id number]  HPI Has noticed worsening DOE over the past month Can't go up 3 flights, extended flat walking--- all clearly able to do less No rest symptoms Sleeps with head up to prevent vertigo-no PND  Had sharp chest pain at rest the other night No related to the DOE No dizziness or syncope No edema  Off the antidepressant-- generally okay Mood is more labile--not a consistent problem  Current Outpatient Prescriptions on File Prior to Visit  Medication Sig Dispense Refill  . aspirin 81 MG chewable tablet Chew 81 mg by mouth every other day.        Marland Kitchen atorvastatin (LIPITOR) 20 MG tablet Take 1 tablet (20 mg total) by mouth daily.  90 tablet  3  . Cholecalciferol (VITAMIN D3) 5000 UNITS CAPS Take 1 tablet by mouth daily.      . cyanocobalamin (,VITAMIN B-12,) 1000 MCG/ML injection Inject 1 mL (1,000 mcg total) into the muscle every 30 (thirty) days.  3 mL  3  . levothyroxine (SYNTHROID, LEVOTHROID) 75 MCG tablet Take 1 tablet (75 mcg   total) by mouth daily  90 tablet  2  . lisinopril-hydrochlorothiazide (PRINZIDE,ZESTORETIC) 10-12.5 MG per tablet Take one-half tablet by  mouth daily  45 tablet  3  . traZODone (DESYREL) 50 MG tablet Take 2 tablets by mouth   every night at bedtime  180 tablet  1  . Tuberculin-Allergy Syringes 25G X 1" 1 ML MISC Inject 1 mL as directed every 30 (thirty) days.  3 each  3   No current facility-administered medications on file prior to visit.    Allergies  Allergen Reactions  . Codeine     REACTION: Itching    Past Medical History  Diagnosis Date  . Anemia   . Hypertension   . Diverticulitis   . Celiac disease   . Sleep disturbance   . Vitamin B12 deficiency     unresponsive to oral replacement  . Thyroiditis, lymphocytic     surgery 2013  . Hyperlipidemia   . GERD (gastroesophageal reflux disease)     Past Surgical History  Procedure Laterality Date    . Esophagogastroduodenoscopy  03/04    colon  . Thyroidectomy  12/12    Lymphocytic thyroiditis with goiter--Dr Rochel Brome  . Laryngeal implant  2/13    after damage during thyroidectomy  . Colonoscopy      Family History  Problem Relation Age of Onset  . Stroke Father   . Colon cancer Neg Hx     History   Social History  . Marital Status: Married    Spouse Name: N/A    Number of Children: 2  . 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 Smoking status: Never Smoker   . Smokeless tobacco: Never Used  . Alcohol Use: Yes     Comment: rare  . Drug Use: No  . Sexual Activity: Not on file   Other Topics Concern  . Not on file   Social History Narrative  . No narrative on file   Review of Systems Appetite is fine No change in chronic sleep problems Weight is stable Has had some heartburn--but not related to her SOB episodes    Objective:   Physical Exam  Constitutional: She appears well-developed and well-nourished. No distress.  Neck:  Normal range of motion. Neck supple. No thyromegaly present.  Cardiovascular: Normal rate, regular rhythm, normal heart sounds and intact distal pulses.  Exam reveals no gallop.   No murmur heard. Pulmonary/Chest: Effort normal and breath sounds normal. No respiratory distress. She has no wheezes. She has no rales.  Abdominal: Soft. There is no tenderness.  Musculoskeletal: She exhibits no edema and no tenderness.  Lymphadenopathy:    She has no cervical adenopathy.  Psychiatric: She has a normal mood and affect. Her behavior is normal.          Assessment & Plan:

## 2014-04-20 ENCOUNTER — Encounter (HOSPITAL_COMMUNITY): Payer: 59

## 2014-04-21 ENCOUNTER — Telehealth (HOSPITAL_COMMUNITY): Payer: Self-pay

## 2014-04-21 NOTE — Telephone Encounter (Signed)
Encounter complete. 

## 2014-04-22 ENCOUNTER — Telehealth (HOSPITAL_COMMUNITY): Payer: Self-pay

## 2014-04-22 NOTE — Telephone Encounter (Signed)
Encounter complete. 

## 2014-04-26 ENCOUNTER — Ambulatory Visit (HOSPITAL_COMMUNITY)
Admission: RE | Admit: 2014-04-26 | Discharge: 2014-04-26 | Disposition: A | Discharge status: Home / Self Care | Payer: 59 | Source: Ambulatory Visit | Attending: Internal Medicine | Admitting: Internal Medicine

## 2014-04-26 DIAGNOSIS — R079 Chest pain, unspecified: Secondary | ICD-10-CM | POA: Insufficient documentation

## 2014-04-26 DIAGNOSIS — R0609 Other forms of dyspnea: Secondary | ICD-10-CM | POA: Insufficient documentation

## 2014-04-26 DIAGNOSIS — R0989 Other specified symptoms and signs involving the circulatory and respiratory systems: Secondary | ICD-10-CM | POA: Insufficient documentation

## 2014-04-26 NOTE — Procedures (Signed)
Exercise Treadmill Test  Test  Exercise Tolerance Test Ordering MD: Viviana Simpler, MD    Unique Test No: 1  Treadmill:  1  Indication for ETT: chest pain - rule out ischemia  Contraindication to ETT: No   Stress Modality: exercise - treadmill  Cardiac Imaging Performed: non   Protocol: standard Bruce - maximal  Max BP:  207/86  Max MPHR (bpm):  159 85% MPR (bpm):  135  MPHR obtained (bpm):  162 % MPHR obtained:  101  Reached 85% MPHR (min:sec):  4:45 Total Exercise Time (min-sec):  9  Workload in METS:  10.1 Borg Scale: 16  Reason ETT Terminated:  dyspnea    ST Segment Analysis At Rest: NSR, RAD With Exercise: no evidence of significant ST depression  Other Information Arrhythmia:  No Angina during ETT:  absent (0) Quality of ETT:  diagnostic  ETT Interpretation:  normal - no evidence of ischemia by ST analysis  Comments: Probably normal ETT with good exercise tolerance; no chest pain; probably normal BP response (SBP in stage 2 documented at 207 but not clear this was accurate); no ST changes.  Kirk Ruths

## 2014-05-16 ENCOUNTER — Other Ambulatory Visit: Payer: Self-pay | Admitting: Family Medicine

## 2014-05-16 ENCOUNTER — Other Ambulatory Visit: Payer: Self-pay | Admitting: Internal Medicine

## 2014-05-17 ENCOUNTER — Other Ambulatory Visit: Payer: Self-pay | Admitting: *Deleted

## 2014-05-18 NOTE — Telephone Encounter (Signed)
Electronic Rx request for trazodone received. Rx last filled 10/22/13 and last office visit 04/05/14. Please advise.

## 2014-05-19 NOTE — Telephone Encounter (Signed)
Okay to refill for a year 

## 2014-05-20 NOTE — Telephone Encounter (Signed)
Medication sent to pharmacy as ordered.

## 2014-07-16 ENCOUNTER — Other Ambulatory Visit: Payer: Self-pay | Admitting: Internal Medicine

## 2014-07-29 ENCOUNTER — Encounter: Payer: 59 | Admitting: Internal Medicine

## 2014-08-01 ENCOUNTER — Ambulatory Visit (INDEPENDENT_AMBULATORY_CARE_PROVIDER_SITE_OTHER): Payer: 59 | Admitting: Internal Medicine

## 2014-08-01 ENCOUNTER — Encounter: Payer: Self-pay | Admitting: Internal Medicine

## 2014-08-01 VITALS — BP 110/70 | HR 76 | Temp 98.2°F | Ht 66.0 in | Wt 205.0 lb

## 2014-08-01 DIAGNOSIS — Z23 Encounter for immunization: Secondary | ICD-10-CM

## 2014-08-01 DIAGNOSIS — G479 Sleep disorder, unspecified: Secondary | ICD-10-CM

## 2014-08-01 DIAGNOSIS — E063 Autoimmune thyroiditis: Secondary | ICD-10-CM

## 2014-08-01 DIAGNOSIS — Z0000 Encounter for general adult medical examination without abnormal findings: Secondary | ICD-10-CM

## 2014-08-01 DIAGNOSIS — F39 Unspecified mood [affective] disorder: Secondary | ICD-10-CM | POA: Insufficient documentation

## 2014-08-01 DIAGNOSIS — I10 Essential (primary) hypertension: Secondary | ICD-10-CM

## 2014-08-01 DIAGNOSIS — E785 Hyperlipidemia, unspecified: Secondary | ICD-10-CM

## 2014-08-01 MED ORDER — FLUOXETINE HCL 10 MG PO TABS: 10.0000 mg | ORAL_TABLET | Freq: Every day | ORAL | Status: DC

## 2014-08-01 NOTE — Assessment & Plan Note (Signed)
BP Readings from Last 3 Encounters:  08/01/14 110/70  04/05/14 130/80  01/07/14 100/70   Good control No changes needed

## 2014-08-01 NOTE — Assessment & Plan Note (Signed)
Still wants to do primary prevention

## 2014-08-01 NOTE — Progress Notes (Signed)
Subjective:    Patient ID: Sharon Shepherd, female    DOB: 01-May-1951, 63 y.o.   MRN: [id number]  HPI Here for physical Just got standing desk-- trying to eat healthier. Tough this time of year Still sees Dr Romelle Starcher for gyn care Due for mammogram  No problems with cholesterol med Sleeps fair with the trazodone but not great---only takes 1  Wonders about her thyroid due to trouble losing weight  Current Outpatient Prescriptions on File Prior to Visit  Medication Sig Dispense Refill  . aspirin 81 MG chewable tablet Chew 81 mg by mouth every other day.      Marland Kitchen atorvastatin (LIPITOR) 20 MG tablet Take 1 tablet by mouth  daily 90 tablet 0  . Cholecalciferol (VITAMIN D3) 5000 UNITS CAPS Take 1 tablet by mouth daily.    Marland Kitchen levothyroxine (SYNTHROID, LEVOTHROID) 75 MCG tablet Take 1 tablet by mouth  daily 90 tablet 3  . lisinopril-hydrochlorothiazide (PRINZIDE,ZESTORETIC) 10-12.5 MG per tablet Take one-half tablet by  mouth daily 45 tablet 3  . traZODone (DESYREL) 50 MG tablet Take 2 tablets by mouth    every night at bedtime 180 tablet 3   No current facility-administered medications on file prior to visit.    Allergies  Allergen Reactions  . Codeine     REACTION: Itching    Past Medical History  Diagnosis Date  . Anemia   . Hypertension   . Diverticulitis   . Celiac disease   . Sleep disturbance   . Vitamin B12 deficiency     unresponsive to oral replacement  . Thyroiditis, lymphocytic     surgery 2013  . Hyperlipidemia   . GERD (gastroesophageal reflux disease)     Past Surgical History  Procedure Laterality Date  . Esophagogastroduodenoscopy  03/04    colon  . Thyroidectomy  12/12    Lymphocytic thyroiditis with goiter--Dr Rochel Brome  . Laryngeal implant  2/13    after damage during thyroidectomy  . Colonoscopy      Family History  Problem Relation Age of Onset  . Stroke Father   . Colon cancer Neg Hx     History   Social History  . Marital Status:  Married    Spouse Name: N/A    Number of Children: 2  . 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 Smoking status: Never Smoker   . Smokeless tobacco: Never Used  . Alcohol Use: Yes     Comment: rare  . Drug Use: No  . Sexual Activity: Not on file   Other Topics Concern  . Not on file   Social History Narrative    Review of Systems  Constitutional: Negative for fatigue and unexpected weight change.       Wears seat belt  HENT: Positive for tinnitus. Negative for dental problem, hearing loss and trouble swallowing.        Regular with dentist  Eyes: Negative for visual disturbance.       No diplopia or unilateral vision loss  Respiratory: Positive for cough and shortness of breath. Negative for chest tightness.        Still with DOE--?deconditioning Cough now from cold  Cardiovascular: Positive for palpitations. Negative for chest pain.       Rare palpitations (skip) with stress  Gastrointestinal: Negative for nausea, vomiting, abdominal pain, constipation and blood in stool.       Gets reflux at  times--- no meds. Needs to hold off on eating near bedtime  Endocrine: Negative for polydipsia and polyuria.  Genitourinary: Negative for dysuria, hematuria, difficulty urinating and dyspareunia.  Musculoskeletal: Negative for back pain, joint swelling and arthralgias.  Skin: Positive for rash.       Rosacea--sees derm. Uses prescription cream prn  Allergic/Immunologic: Negative for environmental allergies and immunocompromised state.  Neurological: Negative for dizziness, syncope, weakness, light-headedness, numbness and headaches.  Hematological: Negative for adenopathy. Bruises/bleeds easily.  Psychiatric/Behavioral: Positive for sleep disturbance and dysphoric mood. The patient is not nervous/anxious.        Has daily feelings of being down since off the venlafaxine--- seems to need it       Objective:   Physical Exam    Constitutional: She is oriented to person, place, and time. She appears well-developed and well-nourished. No distress.  HENT:  Head: Normocephalic and atraumatic.  Right Ear: External ear normal.  Left Ear: External ear normal.  Mouth/Throat: Oropharynx is clear and moist.  Eyes: Conjunctivae and EOM are normal. Pupils are equal, round, and reactive to light.  Neck: Normal range of motion. Neck supple. No thyromegaly present.  Cardiovascular: Normal rate, regular rhythm, normal heart sounds and intact distal pulses.  Exam reveals no gallop.   No murmur heard. Pulmonary/Chest: Effort normal and breath sounds normal. No respiratory distress. She has no wheezes. She has no rales.  Abdominal: Soft. There is no tenderness.  Musculoskeletal: She exhibits no edema or tenderness.  Lymphadenopathy:    She has no cervical adenopathy.  Neurological: She is alert and oriented to person, place, and time.  Skin: No rash noted. No erythema.  Psychiatric: She has a normal mood and affect. Her behavior is normal.          Assessment & Plan:

## 2014-08-01 NOTE — Assessment & Plan Note (Signed)
Mostly dysthymia and irritability Needs med Will try fluoxetine to see if it decreases appetite also If troubles with this, will go back to venlafaxine

## 2014-08-01 NOTE — Progress Notes (Signed)
Pre visit review using our clinic review tool, if applicable. No additional management support is needed unless otherwise documented below in the visit note. 

## 2014-08-01 NOTE — Assessment & Plan Note (Signed)
Hypothyroid after surgery Due for labs

## 2014-08-01 NOTE — Assessment & Plan Note (Signed)
Due for screening mammogram Pap not due till 2016 UTD on colonoscopy Discussed fitness and proper eating

## 2014-08-01 NOTE — Patient Instructions (Signed)
Please set up your screening mammogram.  DASH Eating Plan DASH stands for "Dietary Approaches to Stop Hypertension." The DASH eating plan is a healthy eating plan that has been shown to reduce high blood pressure (hypertension). Additional health benefits may include reducing the risk of type 2 diabetes mellitus, heart disease, and stroke. The DASH eating plan may also help with weight loss. WHAT DO I NEED TO KNOW ABOUT THE DASH EATING PLAN? For the DASH eating plan, you will follow these general guidelines:  Choose foods with a percent daily value for sodium of less than 5% (as listed on the food label).  Use salt-free seasonings or herbs instead of table salt or sea salt.  Check with your health care provider or pharmacist before using salt substitutes.  Eat lower-sodium products, often labeled as "lower sodium" or "no salt added."  Eat fresh foods.  Eat more vegetables, fruits, and low-fat dairy products.  Choose whole grains. Look for the word "whole" as the first word in the ingredient list.  Choose fish and skinless chicken or [place] more often than red meat. Limit fish, poultry, and meat to 6 oz (170 g) each day.  Limit sweets, desserts, sugars, and sugary drinks.  Choose heart-healthy fats.  Limit cheese to 1 oz (28 g) per day.  Eat more home-cooked food and less restaurant, buffet, and fast food.  Limit fried foods.  Cook foods using methods other than frying.  Limit canned vegetables. If you do use them, rinse them well to decrease the sodium.  When eating at a restaurant, ask that your food be prepared with less salt, or no salt if possible. WHAT FOODS CAN I EAT? Seek help from a dietitian for individual calorie needs. Grains Whole grain or whole wheat bread. Brown rice. Whole grain or whole wheat pasta. Quinoa, bulgur, and whole grain cereals. Low-sodium cereals. Corn or whole wheat flour tortillas. Whole grain cornbread. Whole grain crackers. Low-sodium  crackers. Vegetables Fresh or frozen vegetables (raw, steamed, roasted, or grilled). Low-sodium or reduced-sodium tomato and vegetable juices. Low-sodium or reduced-sodium tomato sauce and paste. Low-sodium or reduced-sodium canned vegetables.  Fruits All fresh, canned (in natural juice), or frozen fruits. Meat and Other Protein Products Ground beef (85% or leaner), grass-fed beef, or beef trimmed of fat. Skinless chicken or [place]. Ground chicken or [place]. Pork trimmed of fat. All fish and seafood. Eggs. Dried beans, peas, or lentils. Unsalted nuts and seeds. Unsalted canned beans. Dairy Low-fat dairy products, such as skim or 1% milk, 2% or reduced-fat cheeses, low-fat ricotta or cottage cheese, or plain low-fat yogurt. Low-sodium or reduced-sodium cheeses. Fats and Oils Tub margarines without trans fats. Light or reduced-fat mayonnaise and salad dressings (reduced sodium). Avocado. Safflower, olive, or canola oils. Natural peanut or almond butter. Other Unsalted popcorn and pretzels. The items listed above may not be a complete list of recommended foods or beverages. Contact your dietitian for more options. WHAT FOODS ARE NOT RECOMMENDED? Grains White bread. White pasta. White rice. Refined cornbread. Bagels and croissants. Crackers that contain trans fat. Vegetables Creamed or fried vegetables. Vegetables in a cheese sauce. Regular canned vegetables. Regular canned tomato sauce and paste. Regular tomato and vegetable juices. Fruits Dried fruits. Canned fruit in light or heavy syrup. Fruit juice. Meat and Other Protein Products Fatty cuts of meat. Ribs, chicken wings, bacon, sausage, bologna, salami, chitterlings, fatback, hot dogs, bratwurst, and packaged luncheon meats. Salted nuts and seeds. Canned beans with salt. Dairy Whole or 2% milk, cream, half-and-half, and  cream cheese. Whole-fat or sweetened yogurt. Full-fat cheeses or blue cheese. Nondairy creamers and whipped toppings.  Processed cheese, cheese spreads, or cheese curds. Condiments Onion and garlic salt, seasoned salt, table salt, and sea salt. Canned and packaged gravies. Worcestershire sauce. Tartar sauce. Barbecue sauce. Teriyaki sauce. Soy sauce, including reduced sodium. Steak sauce. Fish sauce. Oyster sauce. Cocktail sauce. Horseradish. Ketchup and mustard. Meat flavorings and tenderizers. Bouillon cubes. Hot sauce. Tabasco sauce. Marinades. Taco seasonings. Relishes. Fats and Oils Butter, stick margarine, lard, shortening, ghee, and bacon fat. Coconut, palm kernel, or palm oils. Regular salad dressings. Other Pickles and olives. Salted popcorn and pretzels. The items listed above may not be a complete list of foods and beverages to avoid. Contact your dietitian for more information. WHERE CAN I FIND MORE INFORMATION? National Heart, Lung, and Blood Institute: travelstabloid.com Document Released: 07/18/2011 Document Revised: 12/13/2013 Document Reviewed: 06/02/2013 [facility] Patient Information 2015 [place], [place]. This information is not intended to replace advice given to you by your health care provider. Make sure you discuss any questions you have with your health care provider.

## 2014-08-01 NOTE — Assessment & Plan Note (Signed)
Does fair with the trazodone Will watch for changes on the fluoxetine

## 2014-08-02 NOTE — Addendum Note (Signed)
Addended by: Despina Hidden on: 08/02/2014 10:02 AM   Modules accepted: Orders

## 2014-08-03 LAB — CBC WITH DIFFERENTIAL/PLATELET
BASOS: 0 %
Basophils Absolute: 0 10*3/uL (ref 0.0–0.2)
EOS: 0 %
Eosinophils Absolute: 0 10*3/uL (ref 0.0–0.4)
HEMATOCRIT: 42.5 % (ref 34.0–46.6)
Hemoglobin: 14.5 g/dL (ref 11.1–15.9)
Immature Grans (Abs): 0 10*3/uL (ref 0.0–0.1)
Immature Granulocytes: 0 %
LYMPHS ABS: 2.1 10*3/uL (ref 0.7–3.1)
Lymphs: 43 %
MCH: 28.7 pg (ref 26.6–33.0)
MCHC: 34.1 g/dL (ref 31.5–35.7)
MCV: 84 fL (ref 79–97)
MONOCYTES: 15 %
MONOS ABS: 0.7 10*3/uL (ref 0.1–0.9)
Neutrophils Absolute: 2 10*3/uL (ref 1.4–7.0)
Neutrophils Relative %: 42 %
RBC: 5.05 x10E6/uL (ref 3.77–5.28)
RDW: 14.2 % (ref 12.3–15.4)
WBC: 4.8 10*3/uL (ref 3.4–10.8)

## 2014-08-03 LAB — LIPID PANEL
CHOL/HDL RATIO: 2.9 ratio (ref 0.0–4.4)
CHOLESTEROL TOTAL: 152 mg/dL (ref 100–199)
HDL: 53 mg/dL (ref >39)
LDL CALC: 77 mg/dL (ref 0–99)
TRIGLYCERIDES: 108 mg/dL (ref 0–149)
VLDL Cholesterol Cal: 22 mg/dL (ref 5–40)

## 2014-08-03 LAB — COMPREHENSIVE METABOLIC PANEL
ALT: 31 IU/L (ref 0–32)
AST: 29 IU/L (ref 0–40)
Albumin/Globulin Ratio: 1.9 (ref 1.1–2.5)
Albumin: 4.7 g/dL (ref 3.6–4.8)
Alkaline Phosphatase: 92 IU/L (ref 39–117)
BUN/Creatinine Ratio: 19 (ref 11–26)
BUN: 14 mg/dL (ref 8–27)
CHLORIDE: 98 mmol/L (ref 97–108)
CO2: 24 mmol/L (ref 18–29)
Calcium: 10.1 mg/dL (ref 8.7–10.3)
Creatinine, Ser: 0.75 mg/dL (ref 0.57–1.00)
GFR calc non Af Amer: 86 mL/min/{1.73_m2} (ref >59)
GFR, EST AFRICAN AMERICAN: 99 mL/min/{1.73_m2} (ref >59)
GLUCOSE: 85 mg/dL (ref 65–99)
Globulin, Total: 2.5 g/dL (ref 1.5–4.5)
Potassium: 4.1 mmol/L (ref 3.5–5.2)
Sodium: 141 mmol/L (ref 134–144)
TOTAL PROTEIN: 7.2 g/dL (ref 6.0–8.5)
Total Bilirubin: 0.6 mg/dL (ref 0.0–1.2)

## 2014-08-03 LAB — T4, FREE: Free T4: 1.07 ng/dL (ref 0.82–1.77)

## 2014-08-03 LAB — TSH: TSH: 1.55 u[IU]/mL (ref 0.450–4.500)

## 2014-10-12 ENCOUNTER — Other Ambulatory Visit: Payer: Self-pay | Admitting: Internal Medicine

## 2014-10-19 ENCOUNTER — Ambulatory Visit (INDEPENDENT_AMBULATORY_CARE_PROVIDER_SITE_OTHER): Payer: 59 | Admitting: Internal Medicine

## 2014-10-19 ENCOUNTER — Encounter: Payer: Self-pay | Admitting: Internal Medicine

## 2014-10-19 ENCOUNTER — Ambulatory Visit: Payer: Self-pay | Admitting: Family Medicine

## 2014-10-19 VITALS — BP 114/76 | HR 73 | Temp 97.4°F | Wt 199.0 lb

## 2014-10-19 DIAGNOSIS — B349 Viral infection, unspecified: Secondary | ICD-10-CM

## 2014-10-19 DIAGNOSIS — B9789 Other viral agents as the cause of diseases classified elsewhere: Secondary | ICD-10-CM

## 2014-10-19 DIAGNOSIS — J329 Chronic sinusitis, unspecified: Secondary | ICD-10-CM

## 2014-10-19 MED ORDER — METHYLPREDNISOLONE ACETATE 80 MG/ML IJ SUSP
80.0000 mg | Freq: Once | INTRAMUSCULAR | Status: AC
  Administered 2014-10-19: 80 mg via INTRAMUSCULAR

## 2014-10-19 MED ORDER — PROMETHAZINE-DM 6.25-15 MG/5ML PO SYRP
5.0000 mL | ORAL_SOLUTION | Freq: Four times a day (QID) | ORAL | Status: DC | PRN
Start: 2014-10-19 — End: 2015-10-19

## 2014-10-19 NOTE — Progress Notes (Signed)
Pre visit review using our clinic review tool, if applicable. No additional management support is needed unless otherwise documented below in the visit note. 

## 2014-10-19 NOTE — Progress Notes (Signed)
HPI  Pt presents to the clinic today with c/o headache, facial pressure, nasal congestion, sore throat and cough. She reports this started 6 days ago. The cough is non productive. It does seem worse at night. She is blowing clear mucous out of her nose. She denies fever, chills or body aches. She has tried an OTC sinus medication and cough syrup without any relief. She has no history of allergies or breathing problems. She has had sick contacts. She does not smoke.  Review of Systems    Past Medical History  Diagnosis Date  . Anemia   . Hypertension   . Diverticulitis   . Celiac disease   . Sleep disturbance   . Vitamin B12 deficiency     unresponsive to oral replacement  . Thyroiditis, lymphocytic     surgery 2013  . Hyperlipidemia   . GERD (gastroesophageal reflux disease)     Family History  Problem Relation Age of Onset  . Stroke Father   . Colon cancer Neg Hx     History   Social History  . Marital Status: Married    Spouse Name: N/A  . Number of Children: 2  . 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 Smoking status: Never Smoker   . Smokeless tobacco: Never Used  . Alcohol Use: Yes     Comment: rare  . Drug Use: No  . Sexual Activity: Not on file   Other Topics Concern  . Not on file   Social History Narrative    Allergies  Allergen Reactions  . Codeine     REACTION: Itching     Constitutional: Positive headache, fatigue. Denies fever or abrupt weight changes.  HEENT:  Positive facial pain, nasal congestion and sore throat. Denies eye redness, ear pain, ringing in the ears, wax buildup, runny nose or bloody nose. Respiratory: Positive cough. Denies difficulty breathing or shortness of breath.  Cardiovascular: Denies chest pain, chest tightness, palpitations or swelling in the hands or feet.   No other specific complaints in a complete review of systems (except as listed in HPI  above).  Objective:  BP 114/76 mmHg  Pulse 73  Temp(Src) 97.4 F (36.3 C) (Oral)  Wt 199 lb (90.266 kg)  SpO2 98%   General: Appears her stated age, ill appearing in NAD. HEENT: Head: normal shape and size, mild maxillary sinus tenderness noted; Eyes: sclera injected, no icterus, conjunctiva pink; Ears: Tm's gray and intact, normal light reflex; Nose: mucosa boggy and moist, septum midline; Throat/Mouth: + PND. Teeth present, mucosa pink and moist, no exudate noted, no lesions or ulcerations noted.  Neck: Cervical adenopathy noted. Cardiovascular: Normal rate and rhythm. S1,S2 noted.  No murmur, rubs or gallops noted.. Pulmonary/Chest: Normal effort and positive vesicular breath sounds. No respiratory distress. No wheezes, rales or ronchi noted.      Assessment & Plan:   Viral sinusitis  Can use a Neti Pot which can be purchased from your local drug store. Flonase 2 sprays each nostril for 3 days and then as needed. eRx for dextro-prometh cough suppressant Watch for fever, chills, purulent nasal drainage  RTC as needed or if symptoms persist.

## 2014-10-19 NOTE — Patient Instructions (Signed)

## 2014-10-19 NOTE — Addendum Note (Signed)
Addended by: Lurlean Nanny on: 10/19/2014 02:42 PM   Modules accepted: Orders

## 2014-11-08 ENCOUNTER — Other Ambulatory Visit: Payer: Self-pay | Admitting: Internal Medicine

## 2014-11-21 ENCOUNTER — Ambulatory Visit
Admit: 2014-11-21 | Disposition: A | Discharge status: Home / Self Care | Payer: Self-pay | Attending: Unknown Physician Specialty | Admitting: Unknown Physician Specialty

## 2014-12-04 NOTE — Op Note (Signed)
PATIENT NAME:  Sharon Shepherd, Sharon Shepherd MR#:  [id number] DATE OF BIRTH:  1951/03/06  DATE OF PROCEDURE:  10/22/2011  PREOPERATIVE DIAGNOSIS: Right vocal cord paralysis.  POSTOPERATIVE DIAGNOSIS: Right vocal cord paralysis.    OPERATION PERFORMED: Right thyroplasty.   SURGEON: Roena Malady, MD   ASSISTANT SURGEON: Mahlon Gammon, MD   OPERATIVE FINDINGS: Right vocal cord paralysis. A #8 implant was used.   DESCRIPTION OF PROCEDURE: [name] was identified in the holding area, taken to the operating room and placed in the supine position. After IV sedation, the neck was prepped and draped sterilely. A local anesthetic of 1% lidocaine with 1:1,000 epinephrine was used to inject just to the right of midline overlying the thyroid cartilage. With the neck prepped and draped sterilely, a 15 blade was used to incise down to and through the platysma muscle. Hemostasis was achieved using the Bovie cautery. Dissection was taken down to the strap muscles that were overlying the thyroid cartilage. These were gently divided using the Harmonic scalpel. The thyroid ala was easily identified. The musculature was gently teased away from the thyroid cartilage using the microbipolar. Using the standard measuring technique for a female implant, a window was marked using the Montgomery thyroplasty instruments on the right thyroid lamina. The Stryker saw was then used to create the female size rectangular window just above the tubercle on the right side of the thyroid ala. The small thyroid cartilage window was removed. A Cottonoid pledget with 1:1,000 adrenaline was placed within the wound for hemostasis. With no active bleeding, the female sizers were brought onto the field starting at #6 and going up to #9. The sizers were placed within the window. The patient was asked to phonate and it appeared that the #8 implant size gave the maximum voice with minimal airway obstruction. Therefore, a #8 female implant was used and implanted  in the cartilaginous window. With the implant in position, the patient was asked to phonate. Her voice was excellent. The wound was then copiously irrigated with saline. A #7 TLS drain was brought out of the wound inferiorly. The strap muscles were reapproximated using 4-0 Vicryl. The subcutaneous tissues were closed using 4-0 Vicryl and the skin was closed using Dermabond. The TLS drain was then affixed to suction. The patient was then returned to anesthesia where she was awakened in the operating room and taken to the recovery room in stable condition.   CULTURES: None.   SPECIMENS: None.   ESTIMATED BLOOD LOSS: Less than 5 mL.   ____________________________ Roena Malady, MD ctm:drc D: 10/22/2011 11:13:16 ET T: 10/22/2011 13:42:01 ET JOB#: [id number]  cc: Roena Malady, MD, <Dictator> Roena Malady MD ELECTRONICALLY SIGNED 11/19/2011 7:15

## 2015-03-06 ENCOUNTER — Telehealth: Payer: Self-pay

## 2015-03-06 NOTE — Telephone Encounter (Signed)
Called and spoke with patient, and notified them that they were due for a Mammogram. Patient wanted information on [facility] at [facility]. Patient will call and schedule an appointment.

## 2015-07-11 ENCOUNTER — Other Ambulatory Visit: Payer: Self-pay | Admitting: Internal Medicine

## 2015-07-20 ENCOUNTER — Other Ambulatory Visit: Payer: Self-pay | Admitting: Internal Medicine

## 2015-07-20 DIAGNOSIS — E785 Hyperlipidemia, unspecified: Secondary | ICD-10-CM

## 2015-07-28 ENCOUNTER — Other Ambulatory Visit (INDEPENDENT_AMBULATORY_CARE_PROVIDER_SITE_OTHER): Payer: 59

## 2015-07-28 DIAGNOSIS — E785 Hyperlipidemia, unspecified: Secondary | ICD-10-CM

## 2015-07-29 LAB — CBC WITH DIFFERENTIAL/PLATELET
Basophils Absolute: 0 10*3/uL (ref 0.0–0.2)
Basos: 0 %
EOS (ABSOLUTE): 0.1 10*3/uL (ref 0.0–0.4)
EOS: 3 %
HEMATOCRIT: 39.1 % (ref 34.0–46.6)
HEMOGLOBIN: 13.3 g/dL (ref 11.1–15.9)
IMMATURE GRANS (ABS): 0 10*3/uL (ref 0.0–0.1)
Immature Granulocytes: 0 %
LYMPHS ABS: 1.7 10*3/uL (ref 0.7–3.1)
Lymphs: 40 %
MCH: 28.1 pg (ref 26.6–33.0)
MCHC: 34 g/dL (ref 31.5–35.7)
MCV: 83 fL (ref 79–97)
MONOCYTES: 15 %
Monocytes Absolute: 0.6 10*3/uL (ref 0.1–0.9)
NEUTROS ABS: 1.8 10*3/uL (ref 1.4–7.0)
Neutrophils: 42 %
Platelets: 204 10*3/uL (ref 150–379)
RBC: 4.74 x10E6/uL (ref 3.77–5.28)
RDW: 13.7 % (ref 12.3–15.4)
WBC: 4.3 10*3/uL (ref 3.4–10.8)

## 2015-07-29 LAB — LIPID PANEL
CHOLESTEROL TOTAL: 134 mg/dL (ref 100–199)
Chol/HDL Ratio: 2.5 ratio units (ref 0.0–4.4)
HDL: 53 mg/dL (ref >39)
LDL Calculated: 67 mg/dL (ref 0–99)
Triglycerides: 71 mg/dL (ref 0–149)
VLDL CHOLESTEROL CAL: 14 mg/dL (ref 5–40)

## 2015-07-29 LAB — COMPREHENSIVE METABOLIC PANEL
ALBUMIN: 4.2 g/dL (ref 3.6–4.8)
ALT: 24 IU/L (ref 0–32)
AST: 20 IU/L (ref 0–40)
Albumin/Globulin Ratio: 1.8 (ref 1.1–2.5)
Alkaline Phosphatase: 112 IU/L (ref 39–117)
BUN / CREAT RATIO: 18 (ref 11–26)
BUN: 12 mg/dL (ref 8–27)
Bilirubin Total: 0.9 mg/dL (ref 0.0–1.2)
CALCIUM: 9 mg/dL (ref 8.7–10.3)
CO2: 25 mmol/L (ref 18–29)
CREATININE: 0.65 mg/dL (ref 0.57–1.00)
Chloride: 103 mmol/L (ref 96–106)
GFR, EST AFRICAN AMERICAN: 110 mL/min/{1.73_m2} (ref >59)
GFR, EST NON AFRICAN AMERICAN: 95 mL/min/{1.73_m2} (ref >59)
GLUCOSE: 83 mg/dL (ref 65–99)
Globulin, Total: 2.3 g/dL (ref 1.5–4.5)
Potassium: 4.1 mmol/L (ref 3.5–5.2)
Sodium: 143 mmol/L (ref 134–144)
TOTAL PROTEIN: 6.5 g/dL (ref 6.0–8.5)

## 2015-07-29 LAB — T4, FREE: Free T4: 1.19 ng/dL (ref 0.82–1.77)

## 2015-08-04 ENCOUNTER — Encounter: Payer: 59 | Admitting: Internal Medicine

## 2015-10-07 ENCOUNTER — Other Ambulatory Visit: Payer: Self-pay | Admitting: Internal Medicine

## 2015-10-08 ENCOUNTER — Other Ambulatory Visit: Payer: Self-pay | Admitting: Internal Medicine

## 2015-10-09 ENCOUNTER — Other Ambulatory Visit: Payer: Self-pay | Admitting: Internal Medicine

## 2015-10-09 ENCOUNTER — Other Ambulatory Visit: Payer: Self-pay | Admitting: Obstetrics and Gynecology

## 2015-10-09 DIAGNOSIS — Z1231 Encounter for screening mammogram for malignant neoplasm of breast: Secondary | ICD-10-CM

## 2015-10-17 ENCOUNTER — Ambulatory Visit
Admission: RE | Admit: 2015-10-17 | Discharge: 2015-10-17 | Disposition: A | Discharge status: Home / Self Care | Payer: 59 | Source: Ambulatory Visit | Attending: Internal Medicine | Admitting: Internal Medicine

## 2015-10-17 DIAGNOSIS — Z1231 Encounter for screening mammogram for malignant neoplasm of breast: Secondary | ICD-10-CM | POA: Diagnosis not present

## 2015-10-19 ENCOUNTER — Encounter: Payer: Self-pay | Admitting: Internal Medicine

## 2015-10-19 ENCOUNTER — Ambulatory Visit (INDEPENDENT_AMBULATORY_CARE_PROVIDER_SITE_OTHER)
Admission: RE | Admit: 2015-10-19 | Discharge: 2015-10-19 | Disposition: A | Discharge status: Home / Self Care | Payer: 59 | Source: Ambulatory Visit | Attending: Internal Medicine | Admitting: Internal Medicine

## 2015-10-19 ENCOUNTER — Ambulatory Visit (INDEPENDENT_AMBULATORY_CARE_PROVIDER_SITE_OTHER): Payer: 59 | Admitting: Internal Medicine

## 2015-10-19 ENCOUNTER — Other Ambulatory Visit: Payer: Self-pay | Admitting: Internal Medicine

## 2015-10-19 VITALS — BP 106/76 | HR 74 | Temp 97.7°F | Wt 195.5 lb

## 2015-10-19 DIAGNOSIS — K5900 Constipation, unspecified: Secondary | ICD-10-CM | POA: Diagnosis not present

## 2015-10-19 DIAGNOSIS — R1084 Generalized abdominal pain: Secondary | ICD-10-CM | POA: Diagnosis not present

## 2015-10-19 DIAGNOSIS — K5909 Other constipation: Secondary | ICD-10-CM

## 2015-10-19 DIAGNOSIS — R140 Abdominal distension (gaseous): Secondary | ICD-10-CM

## 2015-10-19 MED ORDER — LINACLOTIDE 145 MCG PO CAPS: 145.0000 ug | ORAL_CAPSULE | Freq: Every day | ORAL | Status: DC

## 2015-10-19 NOTE — Progress Notes (Signed)
Subjective:    Patient ID: Sharon Shepherd, female    DOB: [birth year], 65 y.o.   MRN: [id number]  HPI  Pt presents to the clinic today with a/o abdominal pain, bloating, constipation and loose stool. This has been a chronic issue for her. She was feeling constipated, so over the weekend, she took and Ex Lax. Saturday, she had a loose stool, but still did not feel completely empty. Monday, she uses a Fleet's enema and reports she has has had watery loose stools since then, but she does not feel completely empty. She denies fever, chills, nausea or vomiting. She denies reflux, heartburn or blood in her stool. She denies changes in her diet or medications. She does have a history of diverticulosis and celiacs disease. She reports she has had bouts of this in the past, and has issues with chronic constipation, but she has never been diagnosed with IBS.  Review of Systems      Past Medical History  Diagnosis Date  . Anemia   . Hypertension   . Diverticulitis   . Celiac disease   . Sleep disturbance   . Vitamin B12 deficiency     unresponsive to oral replacement  . Thyroiditis, lymphocytic     surgery 2013  . Hyperlipidemia   . GERD (gastroesophageal reflux disease)     Current Outpatient Prescriptions  Medication Sig Dispense Refill  . aspirin 81 MG chewable tablet Chew 81 mg by mouth every other day.      Marland Kitchen atorvastatin (LIPITOR) 20 MG tablet Take 1 tablet by mouth  daily 90 tablet 0  . Cholecalciferol (VITAMIN D3) 5000 UNITS CAPS Take 1 tablet by mouth daily.    Marland Kitchen FLUoxetine (PROZAC) 10 MG tablet Take 1 tablet (10 mg total) by mouth daily. 90 tablet 3  . levothyroxine (SYNTHROID, LEVOTHROID) 75 MCG tablet Take 1 tablet by mouth  daily 90 tablet 0  . lisinopril-hydrochlorothiazide (PRINZIDE,ZESTORETIC) 10-12.5 MG per tablet Take one-half tablet by  mouth daily 45 tablet 3  . promethazine-dextromethorphan (PROMETHAZINE-DM) 6.25-15 MG/5ML syrup Take 5 mLs by mouth 4 (four) times  daily as needed for cough. 118 mL 0  . traZODone (DESYREL) 50 MG tablet Take 2 tablets by mouth    every night at bedtime 180 tablet 3   No current facility-administered medications for this visit.    Allergies  Allergen Reactions  . Codeine     REACTION: Itching    Family History  Problem Relation Age of Onset  . Stroke Father   . Colon cancer Neg Hx     Social History   Social History  . Marital Status: Married    Spouse Name: N/A  . Number of Children: 2  . 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 Smoking status: Never Smoker   . Smokeless tobacco: Never Used  . Alcohol Use: Yes     Comment: rare  . Drug Use: No  . Sexual Activity: Not on file   Other Topics Concern  . Not on file   Social History Narrative     Constitutional: Denies fever, malaise, fatigue, headache or abrupt weight changes.  Respiratory: Denies difficulty breathing, shortness of breath, cough or sputum production.   Cardiovascular: Denies chest pain, chest tightness, palpitations or swelling in the hands or feet.  Gastrointestinal: Pt reports abdominal pain, bloating, constipation and loose stool. Denies blood in the stool.  GU: Denies urgency,  frequency, pain with urination, burning sensation, blood in urine, odor or discharge.  No other specific complaints in a complete review of systems (except as listed in HPI above).  Objective:   Physical Exam   BP 106/76 mmHg  Pulse 74  Temp(Src) 97.7 F (36.5 C) (Oral)  Wt 195 lb 8 oz (88.678 kg)  SpO2 98% Wt Readings from Last 3 Encounters:  10/19/15 195 lb 8 oz (88.678 kg)  10/19/14 199 lb (90.266 kg)  08/01/14 205 lb (92.987 kg)    General: Appears her stated age, obese in NAD. Cardiovascular: Normal rate and rhythm. S1,S2 noted.  No murmur, rubs or gallops noted.  Pulmonary/Chest: Normal effort and positive vesicular breath sounds. No respiratory distress. No wheezes, rales or  ronchi noted.  Abdomen: Soft and generally tender. Normal bowel sounds. No distention or masses noted.   BMET    Component Value Date/Time   NA 143 07/28/2015 1152   K 4.1 07/28/2015 1152   K 3.8 10/17/2011 1205   CL 103 07/28/2015 1152   CO2 25 07/28/2015 1152   GLUCOSE 83 07/28/2015 1152   BUN 12 07/28/2015 1152   CREATININE 0.65 07/28/2015 1152   CREATININE 0.85 07/21/2012 1209   CALCIUM 9.0 07/28/2015 1152   GFRNONAA 95 07/28/2015 1152   GFRNONAA >60 07/21/2012 1209   GFRAA 110 07/28/2015 1152   GFRAA >60 07/21/2012 1209    Lipid Panel     Component Value Date/Time   CHOL 134 07/28/2015 1152   TRIG 71 07/28/2015 1152   HDL 53 07/28/2015 1152   CHOLHDL 2.5 07/28/2015 1152   LDLCALC 67 07/28/2015 1152    CBC    Component Value Date/Time   WBC 4.3 07/28/2015 1152   WBC 3.3* 07/21/2012 1209   RBC 4.74 07/28/2015 1152   RBC 5.11 07/21/2012 1209   HGB 14.5 08/01/2014 1650   HGB 12.5 07/21/2012 1209   HCT 39.1 07/28/2015 1152   HCT 42.5 08/01/2014 1650   HCT 38.2 07/21/2012 1209   PLT 204 07/28/2015 1152   PLT 168 07/21/2012 1209   MCV 83 07/28/2015 1152   MCV 84 08/01/2014 1650   MCV 75* 07/21/2012 1209   MCH 28.1 07/28/2015 1152   MCH 24.5* 07/21/2012 1209   MCHC 34.0 07/28/2015 1152   MCHC 32.8 07/21/2012 1209   RDW 13.7 07/28/2015 1152   RDW 15.4* 07/21/2012 1209   LYMPHSABS 1.7 07/28/2015 1152   LYMPHSABS 1.4 07/21/2012 1209   MONOABS 0.4 07/21/2012 1209   EOSABS 0.1 07/28/2015 1152   EOSABS 0.0 08/01/2014 1650   EOSABS 0.0 07/21/2012 1209   BASOSABS 0.0 07/28/2015 1152   BASOSABS 0.0 07/21/2012 1209    Hgb A1C No results found for: HGBA1C      Assessment & Plan:   Abdominal pain, bloating, constipation:  Symptoms concerning for IBS-C Does not sound like diverticulitis Will obtain KUB If stool burden noted, will start Linzess- low dose Avoid any laxatives or enemas until we get xray back Encouraged high fiber diet and fluids  Will  follow up after xray is back, RTC if symptoms persist or worsen

## 2015-10-19 NOTE — Progress Notes (Signed)
Pre visit review using our clinic review tool, if applicable. No additional management support is needed unless otherwise documented below in the visit note. 

## 2015-10-19 NOTE — Patient Instructions (Signed)

## 2015-11-30 ENCOUNTER — Other Ambulatory Visit: Payer: Self-pay | Admitting: Internal Medicine

## 2015-11-30 NOTE — Telephone Encounter (Signed)
Rx sent electronically.  

## 2015-12-26 ENCOUNTER — Other Ambulatory Visit: Payer: Self-pay | Admitting: Internal Medicine

## 2016-01-02 ENCOUNTER — Ambulatory Visit (INDEPENDENT_AMBULATORY_CARE_PROVIDER_SITE_OTHER): Payer: 59 | Admitting: Internal Medicine

## 2016-01-02 ENCOUNTER — Encounter: Payer: Self-pay | Admitting: Internal Medicine

## 2016-01-02 VITALS — BP 130/90 | HR 64 | Temp 97.3°F | Ht 66.0 in | Wt 201.2 lb

## 2016-01-02 DIAGNOSIS — E785 Hyperlipidemia, unspecified: Secondary | ICD-10-CM | POA: Diagnosis not present

## 2016-01-02 DIAGNOSIS — Z0000 Encounter for general adult medical examination without abnormal findings: Secondary | ICD-10-CM

## 2016-01-02 DIAGNOSIS — I10 Essential (primary) hypertension: Secondary | ICD-10-CM | POA: Diagnosis not present

## 2016-01-02 MED ORDER — ATORVASTATIN CALCIUM 20 MG PO TABS: ORAL_TABLET | ORAL | Status: DC

## 2016-01-02 MED ORDER — LISINOPRIL-HYDROCHLOROTHIAZIDE 10-12.5 MG PO TABS: ORAL_TABLET | ORAL | Status: DC

## 2016-01-02 MED ORDER — LEVOTHYROXINE SODIUM 75 MCG PO TABS: ORAL_TABLET | ORAL | Status: DC

## 2016-01-02 NOTE — Progress Notes (Signed)
Subjective:    Patient ID: Sharon Shepherd, female    DOB: 1951-06-06, 65 y.o.   MRN: [id number]  HPI Here for physical  Abdominal symptoms better with linzess No longer covered by insurance Will be running out soon Constipated without this  Still sees Dr Romelle Starcher Due for pap soon Recent mammogram  No problems with cholesterol and BP meds Having trouble with weight--can't keep it down  Current Outpatient Prescriptions on File Prior to Visit  Medication Sig Dispense Refill  . aspirin 81 MG chewable tablet Chew 81 mg by mouth every other day.      Marland Kitchen atorvastatin (LIPITOR) 20 MG tablet Take 1 tablet by mouth  daily 90 tablet 1  . Cholecalciferol (VITAMIN D3) 5000 UNITS CAPS Take 1 tablet by mouth daily.    Marland Kitchen levothyroxine (SYNTHROID, LEVOTHROID) 75 MCG tablet Take 1 tablet by mouth  daily 90 tablet 0  . lisinopril-hydrochlorothiazide (PRINZIDE,ZESTORETIC) 10-12.5 MG tablet Take one-half tablet by  mouth daily 45 tablet 0   No current facility-administered medications on file prior to visit.    Allergies  Allergen Reactions  . Codeine     REACTION: Itching    Past Medical History  Diagnosis Date  . Anemia   . Hypertension   . Diverticulitis   . Celiac disease   . Sleep disturbance   . Vitamin B12 deficiency     unresponsive to oral replacement  . Thyroiditis, lymphocytic     surgery 2013  . Hyperlipidemia   . GERD (gastroesophageal reflux disease)     Past Surgical History  Procedure Laterality Date  . Esophagogastroduodenoscopy  03/04    colon  . Thyroidectomy  12/12    Lymphocytic thyroiditis with goiter--Dr Rochel Brome  . Laryngeal implant  2/13    after damage during thyroidectomy  . Colonoscopy      Family History  Problem Relation Age of Onset  . Stroke Father   . Colon cancer Neg Hx     Social History   Social History  . Marital Status: Married    Spouse Name: N/A  . Number of Children: 2  . 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 Smoking status: Never Smoker   . Smokeless tobacco: Never Used  . Alcohol Use: Yes     Comment: rare  . Drug Use: No  . Sexual Activity: Not on file   Other Topics Concern  . Not on file   Social History Narrative      Review of Systems  Constitutional: Negative for fatigue and unexpected weight change.       Wears seat belt  HENT: Negative for dental problem, hearing loss and trouble swallowing.        Keeps up with dentist Rare tinnitus  Eyes: Negative for visual disturbance.       No diplopia or unilateral vision loss  Respiratory: Negative for cough, chest tightness and shortness of breath.        Has fairly stable DOE--like on stairs  Cardiovascular: Negative for chest pain and leg swelling.       Occasional racing at rest---goes away quickly  Gastrointestinal: Positive for constipation. Negative for nausea, abdominal pain and blood in stool.  Endocrine: Negative for polydipsia and polyuria.  Genitourinary: Negative for dysuria, hematuria and dyspareunia.       Still has frequent hot flashes  Musculoskeletal: Negative for back pain, joint swelling and  arthralgias.  Skin:       Dermatologist for rosacea--topicals only  Allergic/Immunologic: Negative for environmental allergies and immunocompromised state.  Neurological: Negative for dizziness, syncope, weakness, numbness and headaches.  Hematological: Negative for adenopathy. Does not bruise/bleed easily.  Psychiatric/Behavioral: Negative for dysphoric mood. The patient is not nervous/anxious.        Chronic sleep problems-- OTC doxalamine does help Melatonin not effective       Objective:   Physical Exam  Constitutional: She is oriented to person, place, and time. She appears well-developed and well-nourished. No distress.  HENT:  Head: Normocephalic and atraumatic.  Right Ear: External ear normal.  Left Ear: External ear normal.   Mouth/Throat: Oropharynx is clear and moist. No oropharyngeal exudate.  Eyes: Conjunctivae are normal. Pupils are equal, round, and reactive to light.  Neck: Normal range of motion. Neck supple. No thyromegaly present.  Cardiovascular: Normal rate, regular rhythm, normal heart sounds and intact distal pulses.  Exam reveals no gallop.   No murmur heard. Pulmonary/Chest: Effort normal and breath sounds normal. No respiratory distress. She has no wheezes. She has no rales.  Abdominal: Soft. There is no tenderness.  Musculoskeletal: She exhibits no edema or tenderness.  Lymphadenopathy:    She has no cervical adenopathy.  Neurological: She is alert and oriented to person, place, and time.  Skin: No rash noted. No erythema.  Psychiatric: She has a normal mood and affect. Her behavior is normal.          Assessment & Plan:

## 2016-01-02 NOTE — Assessment & Plan Note (Signed)
BP Readings from Last 3 Encounters:  01/02/16 130/90  10/19/15 106/76  10/19/14 114/76   Good control No change needed

## 2016-01-02 NOTE — Assessment & Plan Note (Signed)
Healthy but needs to work on fitness Colon due 2025 Just had mammo Has appt with gyn coming up

## 2016-01-02 NOTE — Patient Instructions (Signed)
DASH Eating Plan  DASH stands for "Dietary Approaches to Stop Hypertension." The DASH eating plan is a healthy eating plan that has been shown to reduce high blood pressure (hypertension). Additional health benefits may include reducing the risk of type 2 diabetes mellitus, heart disease, and stroke. The DASH eating plan may also help with weight loss.  WHAT DO I NEED TO KNOW ABOUT THE DASH EATING PLAN?  For the DASH eating plan, you will follow these general guidelines:  · Choose foods with a percent daily value for sodium of less than 5% (as listed on the food label).  · Use salt-free seasonings or herbs instead of table salt or sea salt.  · Check with your health care provider or pharmacist before using salt substitutes.  · Eat lower-sodium products, often labeled as "lower sodium" or "no salt added."  · Eat fresh foods.  · Eat more vegetables, fruits, and low-fat dairy products.  · Choose whole grains. Look for the word "whole" as the first word in the ingredient list.  · Choose fish and skinless chicken or turkey more often than red meat. Limit fish, poultry, and meat to 6 oz (170 g) each day.  · Limit sweets, desserts, sugars, and sugary drinks.  · Choose heart-healthy fats.  · Limit cheese to 1 oz (28 g) per day.  · Eat more home-cooked food and less restaurant, buffet, and fast food.  · Limit fried foods.  · Cook foods using methods other than frying.  · Limit canned vegetables. If you do use them, rinse them well to decrease the sodium.  · When eating at a restaurant, ask that your food be prepared with less salt, or no salt if possible.  WHAT FOODS CAN I EAT?  Seek help from a dietitian for individual calorie needs.  Grains  Whole grain or whole wheat bread. Brown rice. Whole grain or whole wheat pasta. Quinoa, bulgur, and whole grain cereals. Low-sodium cereals. Corn or whole wheat flour tortillas. Whole grain cornbread. Whole grain crackers. Low-sodium crackers.  Vegetables  Fresh or frozen vegetables  (raw, steamed, roasted, or grilled). Low-sodium or reduced-sodium tomato and vegetable juices. Low-sodium or reduced-sodium tomato sauce and paste. Low-sodium or reduced-sodium canned vegetables.   Fruits  All fresh, canned (in natural juice), or frozen fruits.  Meat and Other Protein Products  Ground beef (85% or leaner), grass-fed beef, or beef trimmed of fat. Skinless chicken or turkey. Ground chicken or turkey. Pork trimmed of fat. All fish and seafood. Eggs. Dried beans, peas, or lentils. Unsalted nuts and seeds. Unsalted canned beans.  Dairy  Low-fat dairy products, such as skim or 1% milk, 2% or reduced-fat cheeses, low-fat ricotta or cottage cheese, or plain low-fat yogurt. Low-sodium or reduced-sodium cheeses.  Fats and Oils  Tub margarines without trans fats. Light or reduced-fat mayonnaise and salad dressings (reduced sodium). Avocado. Safflower, olive, or canola oils. Natural peanut or almond butter.  Other  Unsalted popcorn and pretzels.  The items listed above may not be a complete list of recommended foods or beverages. Contact your dietitian for more options.  WHAT FOODS ARE NOT RECOMMENDED?  Grains  White bread. White pasta. White rice. Refined cornbread. Bagels and croissants. Crackers that contain trans fat.  Vegetables  Creamed or fried vegetables. Vegetables in a cheese sauce. Regular canned vegetables. Regular canned tomato sauce and paste. Regular tomato and vegetable juices.  Fruits  Dried fruits. Canned fruit in light or heavy syrup. Fruit juice.  Meat and Other Protein   Products  Fatty cuts of meat. Ribs, chicken wings, bacon, sausage, bologna, salami, chitterlings, fatback, hot dogs, bratwurst, and packaged luncheon meats. Salted nuts and seeds. Canned beans with salt.  Dairy  Whole or 2% milk, cream, half-and-half, and cream cheese. Whole-fat or sweetened yogurt. Full-fat cheeses or blue cheese. Nondairy creamers and whipped toppings. Processed cheese, cheese spreads, or cheese  curds.  Condiments  Onion and garlic salt, seasoned salt, table salt, and sea salt. Canned and packaged gravies. Worcestershire sauce. Tartar sauce. Barbecue sauce. Teriyaki sauce. Soy sauce, including reduced sodium. Steak sauce. Fish sauce. Oyster sauce. Cocktail sauce. Horseradish. Ketchup and mustard. Meat flavorings and tenderizers. Bouillon cubes. Hot sauce. Tabasco sauce. Marinades. Taco seasonings. Relishes.  Fats and Oils  Butter, stick margarine, lard, shortening, ghee, and bacon fat. Coconut, palm kernel, or palm oils. Regular salad dressings.  Other  Pickles and olives. Salted popcorn and pretzels.  The items listed above may not be a complete list of foods and beverages to avoid. Contact your dietitian for more information.  WHERE CAN I FIND MORE INFORMATION?  National Heart, Lung, and Blood Institute: www.nhlbi.nih.gov/health/health-topics/topics/dash/     This information is not intended to replace advice given to you by your health care provider. Make sure you discuss any questions you have with your health care provider.     Document Released: 07/18/2011 Document Revised: 08/19/2014 Document Reviewed: 06/02/2013  Elsevier Interactive Patient Education ©2016 Elsevier Inc.

## 2016-01-02 NOTE — Assessment & Plan Note (Signed)
No problems with statin 

## 2016-01-02 NOTE — Progress Notes (Signed)
Pre visit review using our clinic review tool, if applicable. No additional management support is needed unless otherwise documented below in the visit note. 

## 2016-02-02 ENCOUNTER — Encounter: Payer: Self-pay | Admitting: Internal Medicine

## 2016-02-03 MED ORDER — LACTULOSE 20 GM/30ML PO SOLN: 30.0000 mL | Freq: Every day | ORAL | Status: DC

## 2016-02-03 MED ORDER — LINACLOTIDE 145 MCG PO CAPS: 145.0000 ug | ORAL_CAPSULE | Freq: Every day | ORAL | Status: DC

## 2016-04-08 ENCOUNTER — Telehealth: Payer: Self-pay | Admitting: Internal Medicine

## 2016-04-08 NOTE — Telephone Encounter (Signed)
Patient brought in a eHealth Screening form to be filled out.  It was placed in the [facility] pharmacy cubby.

## 2016-04-08 NOTE — Telephone Encounter (Signed)
Form placed in Dr Alla German InBox on his desk

## 2016-04-09 NOTE — Telephone Encounter (Signed)
I left a message on patient's voice mail that form is ready for pick up.

## 2016-04-09 NOTE — Telephone Encounter (Signed)
Pt aware.

## 2016-04-09 NOTE — Telephone Encounter (Signed)
Form done No charge 

## 2017-01-03 ENCOUNTER — Encounter: Payer: 59 | Admitting: Internal Medicine

## 2017-01-04 ENCOUNTER — Other Ambulatory Visit: Payer: Self-pay | Admitting: Internal Medicine

## 2017-01-13 ENCOUNTER — Encounter: Payer: Self-pay | Admitting: Internal Medicine

## 2017-01-13 ENCOUNTER — Ambulatory Visit (INDEPENDENT_AMBULATORY_CARE_PROVIDER_SITE_OTHER): Payer: 59 | Admitting: Internal Medicine

## 2017-01-13 VITALS — BP 128/88 | HR 66 | Temp 97.4°F | Ht 65.5 in | Wt 207.0 lb

## 2017-01-13 DIAGNOSIS — Z0000 Encounter for general adult medical examination without abnormal findings: Secondary | ICD-10-CM

## 2017-01-13 DIAGNOSIS — E785 Hyperlipidemia, unspecified: Secondary | ICD-10-CM

## 2017-01-13 DIAGNOSIS — E538 Deficiency of other specified B group vitamins: Secondary | ICD-10-CM

## 2017-01-13 DIAGNOSIS — I10 Essential (primary) hypertension: Secondary | ICD-10-CM

## 2017-01-13 DIAGNOSIS — K900 Celiac disease: Secondary | ICD-10-CM

## 2017-01-13 DIAGNOSIS — E039 Hypothyroidism, unspecified: Secondary | ICD-10-CM

## 2017-01-13 MED ORDER — ATORVASTATIN CALCIUM 20 MG PO TABS: ORAL_TABLET | ORAL | 3 refills | Status: DC

## 2017-01-13 MED ORDER — LEVOTHYROXINE SODIUM 75 MCG PO TABS: 75.0000 ug | ORAL_TABLET | Freq: Every day | ORAL | 3 refills | Status: DC

## 2017-01-13 MED ORDER — LINACLOTIDE 145 MCG PO CAPS: 145.0000 ug | ORAL_CAPSULE | Freq: Every day | ORAL | 11 refills | Status: DC

## 2017-01-13 MED ORDER — LISINOPRIL-HYDROCHLOROTHIAZIDE 10-12.5 MG PO TABS: ORAL_TABLET | ORAL | 3 refills | Status: DC

## 2017-01-13 NOTE — Assessment & Plan Note (Signed)
BP Readings from Last 3 Encounters:  01/13/17 128/88  01/02/16 130/90  10/19/15 106/76   Good control

## 2017-01-13 NOTE — Assessment & Plan Note (Signed)
No problems with primary prevention with statin

## 2017-01-13 NOTE — Assessment & Plan Note (Signed)
Hasn't been on Rx

## 2017-01-13 NOTE — Progress Notes (Signed)
Subjective:    Patient ID: Sharon Shepherd, female    DOB: 05/19/1951, 66 y.o.   MRN: [id number]  HPI Here for physical  Has not been taking the linzess due to cost Stays constipated--- ex lax prn Hopes to get coupon and restart the linzess  Still at [employer] back the weight she had lost---too much sitting behind her desk  Current Outpatient Prescriptions on File Prior to Visit  Medication Sig Dispense Refill  . aspirin 81 MG chewable tablet Chew 81 mg by mouth every other day.      Marland Kitchen atorvastatin (LIPITOR) 20 MG tablet Take 1 tablet by mouth  daily 90 tablet 3  . Cholecalciferol (VITAMIN D3) 5000 UNITS CAPS Take 1 tablet by mouth daily.    Marland Kitchen levothyroxine (SYNTHROID, LEVOTHROID) 75 MCG tablet TAKE 1 TABLET BY MOUTH  DAILY 90 tablet 0  . linaclotide (LINZESS) 145 MCG CAPS capsule Take 1 capsule (145 mcg total) by mouth daily before breakfast. 30 capsule 0  . lisinopril-hydrochlorothiazide (PRINZIDE,ZESTORETIC) 10-12.5 MG tablet Take one-half tablet by  mouth daily 45 tablet 3   No current facility-administered medications on file prior to visit.     Allergies  Allergen Reactions  . Codeine     REACTION: Itching    Past Medical History:  Diagnosis Date  . Anemia   . Celiac disease   . Diverticulitis   . GERD (gastroesophageal reflux disease)   . Hyperlipidemia   . Hypertension   . Sleep disturbance   . Thyroiditis, lymphocytic    surgery 2013  . Vitamin B12 deficiency    unresponsive to oral replacement    Past Surgical History:  Procedure Laterality Date  . COLONOSCOPY    . ESOPHAGOGASTRODUODENOSCOPY  03/04   colon  . Laryngeal implant  2/13   after damage during thyroidectomy  . THYROIDECTOMY  12/12   Lymphocytic thyroiditis with goiter--Dr Rochel Brome    Family History  Problem Relation Age of Onset  . Stroke Father   . Colon cancer Neg Hx     Social History   Social History  . Marital status: Married    Spouse name: N/A  . Number  of children: 2  . 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 Smoking status: Never Smoker  . Smokeless tobacco: Never Used  . Alcohol use Yes     Comment: rare  . Drug use: No  . Sexual activity: Not on file   Other Topics Concern  . Not on file   Social History Narrative  . No narrative on file   Review of Systems  Constitutional:       Weight back up 12# Wears seat belt  HENT: Positive for tinnitus. Negative for dental problem and hearing loss.        Keeps up with dentist Gets choked at times---will have spasm in larynx (relates to implant)  Eyes: Negative for visual disturbance.       No diplopia or unilateral vision loss  Respiratory: Negative for cough, chest tightness and shortness of breath.   Cardiovascular: Negative for palpitations and leg swelling.       Rare twinge in chest   Gastrointestinal: Positive for constipation. Negative for abdominal pain and blood in stool.       Discussed prn PPI for symptoms  Endocrine: Negative for polydipsia and polyuria.  Genitourinary: Negative for difficulty urinating, dyspareunia, dysuria and hematuria.  Due to see Dr Romelle Starcher  Musculoskeletal: Negative for arthralgias, back pain and joint swelling.  Skin: Negative for rash.  Allergic/Immunologic: Positive for environmental allergies. Negative for immunocompromised state.       Mild--no meds  Neurological: Negative for dizziness, syncope, light-headedness and headaches.  Hematological: Negative for adenopathy. Bruises/bleeds easily.  Psychiatric/Behavioral: Positive for sleep disturbance. Negative for dysphoric mood. The patient is not nervous/anxious.        Objective:   Physical Exam  Constitutional: She is oriented to person, place, and time. She appears well-nourished. No distress.  HENT:  Head: Normocephalic and atraumatic.  Right Ear: External ear normal.  Left Ear: External ear normal.  Mouth/Throat:  Oropharynx is clear and moist. No oropharyngeal exudate.  Eyes: Conjunctivae are normal. Pupils are equal, round, and reactive to light.  Neck: No thyromegaly present.  Cardiovascular: Normal rate, regular rhythm, normal heart sounds and intact distal pulses.  Exam reveals no gallop.   No murmur heard. Pulmonary/Chest: Effort normal and breath sounds normal. No respiratory distress. She has no wheezes. She has no rales.  Abdominal: Soft. There is no tenderness.  Musculoskeletal: She exhibits no edema or tenderness.  Lymphadenopathy:    She has no cervical adenopathy.  Neurological: She is alert and oriented to person, place, and time.  Skin: No rash noted. No erythema.  Psychiatric: She has a normal mood and affect. Her behavior is normal.          Assessment & Plan:

## 2017-01-13 NOTE — Assessment & Plan Note (Signed)
Healthy Needs to work on fitness Colon due [place] due 3/19 Will check with gyn about pap

## 2017-01-13 NOTE — Assessment & Plan Note (Signed)
Tries to avoid gluten Chronic constipation actually

## 2017-01-13 NOTE — Assessment & Plan Note (Signed)
Will check labs

## 2017-01-14 ENCOUNTER — Other Ambulatory Visit: Payer: Self-pay | Admitting: Internal Medicine

## 2017-01-14 DIAGNOSIS — E538 Deficiency of other specified B group vitamins: Secondary | ICD-10-CM

## 2017-01-14 LAB — COMPREHENSIVE METABOLIC PANEL
A/G RATIO: 1.7 (ref 1.2–2.2)
ALBUMIN: 4.7 g/dL (ref 3.6–4.8)
ALT: 18 IU/L (ref 0–32)
AST: 28 IU/L (ref 0–40)
Alkaline Phosphatase: 99 IU/L (ref 39–117)
BILIRUBIN TOTAL: 0.8 mg/dL (ref 0.0–1.2)
BUN / CREAT RATIO: 15 (ref 12–28)
BUN: 11 mg/dL (ref 8–27)
CALCIUM: 9.8 mg/dL (ref 8.7–10.3)
CHLORIDE: 102 mmol/L (ref 96–106)
CO2: 27 mmol/L (ref 18–29)
Creatinine, Ser: 0.71 mg/dL (ref 0.57–1.00)
GFR, EST AFRICAN AMERICAN: 105 mL/min/{1.73_m2} (ref >59)
GFR, EST NON AFRICAN AMERICAN: 91 mL/min/{1.73_m2} (ref >59)
GLUCOSE: 83 mg/dL (ref 65–99)
Globulin, Total: 2.7 g/dL (ref 1.5–4.5)
Potassium: 4.3 mmol/L (ref 3.5–5.2)
Sodium: 142 mmol/L (ref 134–144)
TOTAL PROTEIN: 7.4 g/dL (ref 6.0–8.5)

## 2017-01-14 LAB — CBC WITH DIFFERENTIAL/PLATELET
BASOS ABS: 0 10*3/uL (ref 0.0–0.2)
BASOS: 1 %
EOS (ABSOLUTE): 0.2 10*3/uL (ref 0.0–0.4)
Eos: 4 %
HEMATOCRIT: 38.9 % (ref 34.0–46.6)
HEMOGLOBIN: 13 g/dL (ref 11.1–15.9)
IMMATURE GRANS (ABS): 0 10*3/uL (ref 0.0–0.1)
Immature Granulocytes: 0 %
LYMPHS ABS: 1.8 10*3/uL (ref 0.7–3.1)
Lymphs: 46 %
MCH: 25.5 pg — AB (ref 26.6–33.0)
MCHC: 33.4 g/dL (ref 31.5–35.7)
MCV: 76 fL — AB (ref 79–97)
Monocytes Absolute: 0.4 10*3/uL (ref 0.1–0.9)
Monocytes: 11 %
NEUTROS ABS: 1.5 10*3/uL (ref 1.4–7.0)
Neutrophils: 38 %
Platelets: 200 10*3/uL (ref 150–379)
RBC: 5.09 x10E6/uL (ref 3.77–5.28)
RDW: 14.7 % (ref 12.3–15.4)
WBC: 3.9 10*3/uL (ref 3.4–10.8)

## 2017-01-14 LAB — LIPID PANEL
CHOL/HDL RATIO: 2.6 ratio (ref 0.0–4.4)
Cholesterol, Total: 142 mg/dL (ref 100–199)
HDL: 54 mg/dL (ref >39)
LDL CALC: 74 mg/dL (ref 0–99)
Triglycerides: 71 mg/dL (ref 0–149)
VLDL CHOLESTEROL CAL: 14 mg/dL (ref 5–40)

## 2017-01-14 LAB — T4, FREE: Free T4: 1.26 ng/dL (ref 0.82–1.77)

## 2017-01-14 LAB — VITAMIN B12

## 2017-01-14 LAB — TSH: TSH: 0.627 u[IU]/mL (ref 0.450–4.500)

## 2017-02-24 ENCOUNTER — Other Ambulatory Visit: Payer: 59

## 2017-02-24 ENCOUNTER — Other Ambulatory Visit (INDEPENDENT_AMBULATORY_CARE_PROVIDER_SITE_OTHER): Payer: 59

## 2017-02-24 DIAGNOSIS — E538 Deficiency of other specified B group vitamins: Secondary | ICD-10-CM

## 2017-02-25 LAB — VITAMIN B12: VITAMIN B 12: 926 pg/mL (ref 232–1245)

## 2017-04-09 ENCOUNTER — Telehealth: Payer: Self-pay | Admitting: Internal Medicine

## 2017-04-09 NOTE — Telephone Encounter (Signed)
Patient brought in a Health Screening Appeal Form to be filled out by the Provider.  The form was placed in the Provider's prescription incoming box.

## 2017-04-09 NOTE — Telephone Encounter (Signed)
Form in Dr Alla German Inbox on his desk

## 2017-04-09 NOTE — Telephone Encounter (Signed)
I left a message on patient's voice mail to let her know form is ready for pick up.

## 2017-04-09 NOTE — Telephone Encounter (Signed)
Form done No charge 

## 2017-11-26 ENCOUNTER — Ambulatory Visit: Payer: Self-pay | Admitting: Family Medicine

## 2017-11-26 ENCOUNTER — Ambulatory Visit: Payer: 59 | Admitting: Family Medicine

## 2017-11-26 DIAGNOSIS — Z0289 Encounter for other administrative examinations: Secondary | ICD-10-CM

## 2017-12-07 ENCOUNTER — Other Ambulatory Visit: Payer: Self-pay | Admitting: Internal Medicine

## 2018-01-14 ENCOUNTER — Ambulatory Visit (INDEPENDENT_AMBULATORY_CARE_PROVIDER_SITE_OTHER): Payer: 59 | Admitting: Internal Medicine

## 2018-01-14 ENCOUNTER — Encounter: Payer: Self-pay | Admitting: Internal Medicine

## 2018-01-14 VITALS — BP 118/80 | HR 70 | Temp 97.7°F | Ht 65.75 in | Wt 205.0 lb

## 2018-01-14 DIAGNOSIS — J4520 Mild intermittent asthma, uncomplicated: Secondary | ICD-10-CM

## 2018-01-14 DIAGNOSIS — E785 Hyperlipidemia, unspecified: Secondary | ICD-10-CM

## 2018-01-14 DIAGNOSIS — I10 Essential (primary) hypertension: Secondary | ICD-10-CM

## 2018-01-14 DIAGNOSIS — E890 Postprocedural hypothyroidism: Secondary | ICD-10-CM

## 2018-01-14 DIAGNOSIS — Z0000 Encounter for general adult medical examination without abnormal findings: Secondary | ICD-10-CM | POA: Diagnosis not present

## 2018-01-14 DIAGNOSIS — J45909 Unspecified asthma, uncomplicated: Secondary | ICD-10-CM | POA: Insufficient documentation

## 2018-01-14 DIAGNOSIS — K900 Celiac disease: Secondary | ICD-10-CM | POA: Diagnosis not present

## 2018-01-14 MED ORDER — ALBUTEROL SULFATE HFA 108 (90 BASE) MCG/ACT IN AERS: 2.0000 | INHALATION_SPRAY | Freq: Four times a day (QID) | RESPIRATORY_TRACT | 1 refills | Status: DC | PRN

## 2018-01-14 MED ORDER — MONTELUKAST SODIUM 10 MG PO TABS: 10.0000 mg | ORAL_TABLET | Freq: Every day | ORAL | 3 refills | Status: DC

## 2018-01-14 MED ORDER — LINACLOTIDE 145 MCG PO CAPS: 145.0000 ug | ORAL_CAPSULE | Freq: Every day | ORAL | 11 refills | Status: DC

## 2018-01-14 NOTE — Progress Notes (Signed)
Subjective:    Patient ID: Sharon Shepherd, female    DOB: Dec 28, 1950, 67 y.o.   MRN: [id number]  HPI Here for physical No new concerns  Severe allergies this year Felt her bronchial tubes were closing up like asthma Used sister's inhaler and it helped Over it now  Has not been on the linzess Really wants to restart it Will look for a coupon  Still on statin No problems  Plans to work for another year Still at [employer]  Current Outpatient Medications on File Prior to Visit  Medication Sig Dispense Refill  . aspirin 81 MG chewable tablet Chew 81 mg by mouth every other day.      Marland Kitchen atorvastatin (LIPITOR) 20 MG tablet Take 1 tablet by mouth  daily 90 tablet 3  . Cholecalciferol (VITAMIN D3) 5000 UNITS CAPS Take 1 tablet by mouth daily.    Marland Kitchen levothyroxine (SYNTHROID, LEVOTHROID) 75 MCG tablet Take 1 tablet (75 mcg total) by mouth daily. 90 tablet 3  . linaclotide (LINZESS) 145 MCG CAPS capsule Take 1 capsule (145 mcg total) by mouth daily before breakfast. 30 capsule 11  . lisinopril-hydrochlorothiazide (PRINZIDE,ZESTORETIC) 10-12.5 MG tablet TAKE ONE-HALF TABLET BY  MOUTH DAILY 45 tablet 0   No current facility-administered medications on file prior to visit.     Allergies  Allergen Reactions  . Codeine     REACTION: Itching    Past Medical History:  Diagnosis Date  . Anemia   . Celiac disease   . Diverticulitis   . GERD (gastroesophageal reflux disease)   . Hyperlipidemia   . Hypertension   . Sleep disturbance   . Thyroiditis, lymphocytic    surgery 2013  . Vitamin B12 deficiency    unresponsive to oral replacement    Past Surgical History:  Procedure Laterality Date  . COLONOSCOPY    . ESOPHAGOGASTRODUODENOSCOPY  03/04   colon  . Laryngeal implant  2/13   after damage during thyroidectomy  . THYROIDECTOMY  12/12   Lymphocytic thyroiditis with goiter--Dr Rochel Brome    Family History  Problem Relation Age of Onset  . Stroke Father   . Colon  cancer Neg Hx     Social History   Socioeconomic History  . Marital status: Married    Spouse name: Not on file  . Number of children: 2  . Years of education: Not on file  . Highest education level: Not on file  Occupational History  . 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 Financial resource strain: Not on file  . Food insecurity:    Worry: Not on file    Inability: Not on file  . Transportation needs:    Medical: Not on file    Non-medical: Not on file  Tobacco Use  . Smoking status: Never Smoker  . Smokeless tobacco: Never Used  Substance and Sexual Activity  . Alcohol use: Yes    Comment: rare  . Drug use: No  . Sexual activity: Not on file  Lifestyle  . Physical activity:    Days per week: Not on file    Minutes per session: Not on file  . Stress: Not on file  Relationships  . Social connections:    Talks on phone: Not on file    Gets together: Not on file    Attends religious service: Not on file    Active member of club or organization: Not on file    Attends meetings of  clubs or organizations: Not on file    Relationship status: Not on file  . Intimate partner violence:    Fear of current or ex partner: Not on file    Emotionally abused: Not on file    Physically abused: Not on file    Forced sexual activity: Not on file  Other Topics Concern  . Not on file  Social History Narrative  . Not on file   Review of Systems  Constitutional: Negative for fatigue and unexpected weight change.       Wears seat belt Trying to exercise some  HENT: Negative for hearing loss and tinnitus.        Keps up with dentist  Eyes:       Had left eye problems---lots of floaters. Not retinal detachment--no action taken  Respiratory: Positive for cough. Negative for chest tightness.        Gets DOE with exertion--out of shape  Cardiovascular: Negative for palpitations and leg swelling.       Still gets occasional sharp chest pains--not exertional     Gastrointestinal: Positive for constipation. Negative for blood in stool.       Still issues swallowing from the vocal cord implant No heartburn  Endocrine: Negative for polydipsia and polyuria.  Genitourinary: Negative for difficulty urinating, dyspareunia and hematuria.  Musculoskeletal: Negative for back pain and joint swelling.       Right hip ache  Skin: Negative for rash.       Sees derm  Allergic/Immunologic: Positive for environmental allergies. Negative for immunocompromised state.  Neurological: Negative for dizziness, syncope, light-headedness and headaches.  Hematological: Negative for adenopathy. Bruises/bleeds easily.  Psychiatric/Behavioral: Negative for decreased concentration. The patient is not nervous/anxious.        Chronic sleep problems---nothing new. Melatonin 2m helps with initiation Tries good hygiene       Objective:   Physical Exam  Constitutional: She is oriented to person, place, and time. She appears well-developed and well-nourished. No distress.  HENT:  Head: Normocephalic and atraumatic.  Right Ear: External ear normal.  Left Ear: External ear normal.  Mouth/Throat: Oropharynx is clear and moist. No oropharyngeal exudate.  Eyes: Pupils are equal, round, and reactive to light. Conjunctivae are normal.  Neck: No thyromegaly present.  Cardiovascular: Normal rate, regular rhythm, normal heart sounds and intact distal pulses. Exam reveals no gallop.  No murmur heard. Respiratory: Effort normal and breath sounds normal. No respiratory distress. She has no wheezes. She has no rales.  GI: Soft. There is no tenderness.  Musculoskeletal: She exhibits no edema or tenderness.  Fairly normal ROM in right hip  Lymphadenopathy:    She has no cervical adenopathy.  Neurological: She is alert and oriented to person, place, and time.  Skin: No rash noted. No erythema.  Psychiatric: She has a normal mood and affect. Her behavior is normal.            Assessment & Plan:

## 2018-01-14 NOTE — Addendum Note (Signed)
Addended by: Ellamae Sia on: 01/14/2018 12:42 PM   Modules accepted: Orders

## 2018-01-14 NOTE — Assessment & Plan Note (Signed)
BP Readings from Last 3 Encounters:  01/14/18 118/80  01/13/17 128/88  01/02/16 130/90   Good control

## 2018-01-14 NOTE — Assessment & Plan Note (Signed)
Montelukast for season Albuterol prn

## 2018-01-14 NOTE — Assessment & Plan Note (Signed)
Will check on replacement

## 2018-01-14 NOTE — Assessment & Plan Note (Signed)
Tries to limit gluten Was anemic in past---needed iron infusions

## 2018-01-14 NOTE — Patient Instructions (Addendum)
Please set up your screening mammogram. Please check with your pharmacist about the shingrix vaccine. Reduce the aspirin to every other day or three times a week

## 2018-01-14 NOTE — Assessment & Plan Note (Signed)
On primary prevention Will cut back aspirin though

## 2018-01-14 NOTE — Assessment & Plan Note (Signed)
Healthy but needs to work on fitness Due for mammogram now Colon due 2025 Will need one last pap at 84 Discussed exercise

## 2018-01-15 ENCOUNTER — Encounter: Payer: Self-pay | Admitting: Internal Medicine

## 2018-01-15 LAB — COMPREHENSIVE METABOLIC PANEL
A/G RATIO: 1.9 (ref 1.2–2.2)
ALBUMIN: 4.5 g/dL (ref 3.6–4.8)
ALK PHOS: 96 IU/L (ref 39–117)
ALT: 17 IU/L (ref 0–32)
AST: 23 IU/L (ref 0–40)
BILIRUBIN TOTAL: 0.5 mg/dL (ref 0.0–1.2)
BUN / CREAT RATIO: 17 (ref 12–28)
BUN: 13 mg/dL (ref 8–27)
CHLORIDE: 105 mmol/L (ref 96–106)
CO2: 23 mmol/L (ref 20–29)
Calcium: 9.5 mg/dL (ref 8.7–10.3)
Creatinine, Ser: 0.77 mg/dL (ref 0.57–1.00)
GFR calc non Af Amer: 82 mL/min/{1.73_m2} (ref >59)
GFR, EST AFRICAN AMERICAN: 94 mL/min/{1.73_m2} (ref >59)
GLUCOSE: 90 mg/dL (ref 65–99)
Globulin, Total: 2.4 g/dL (ref 1.5–4.5)
POTASSIUM: 4.2 mmol/L (ref 3.5–5.2)
SODIUM: 143 mmol/L (ref 134–144)
TOTAL PROTEIN: 6.9 g/dL (ref 6.0–8.5)

## 2018-01-15 LAB — LIPID PANEL
CHOLESTEROL TOTAL: 140 mg/dL (ref 100–199)
Chol/HDL Ratio: 2.5 ratio (ref 0.0–4.4)
HDL: 57 mg/dL (ref >39)
LDL Calculated: 69 mg/dL (ref 0–99)
Triglycerides: 72 mg/dL (ref 0–149)
VLDL Cholesterol Cal: 14 mg/dL (ref 5–40)

## 2018-01-15 LAB — TSH: TSH: 0.239 u[IU]/mL — AB (ref 0.450–4.500)

## 2018-01-15 LAB — T4, FREE: FREE T4: 0.91 ng/dL (ref 0.82–1.77)

## 2018-01-15 NOTE — Addendum Note (Signed)
Addended by: Ellamae Sia on: 01/15/2018 02:52 PM   Modules accepted: Orders

## 2018-01-16 LAB — CBC
Hematocrit: 34.9 % (ref 34.0–46.6)
Hemoglobin: 10.3 g/dL — ABNORMAL LOW (ref 11.1–15.9)
MCH: 20 pg — ABNORMAL LOW (ref 26.6–33.0)
MCHC: 29.5 g/dL — ABNORMAL LOW (ref 31.5–35.7)
MCV: 68 fL — ABNORMAL LOW (ref 79–97)
Platelets: 201 10*3/uL (ref 150–450)
RBC: 5.15 x10E6/uL (ref 3.77–5.28)
RDW: 19.1 % — ABNORMAL HIGH (ref 12.3–15.4)
WBC: 3.8 10*3/uL (ref 3.4–10.8)

## 2018-01-19 ENCOUNTER — Other Ambulatory Visit: Payer: Self-pay | Admitting: Internal Medicine

## 2018-01-19 DIAGNOSIS — D649 Anemia, unspecified: Secondary | ICD-10-CM

## 2018-01-27 ENCOUNTER — Other Ambulatory Visit: Payer: Self-pay | Admitting: Internal Medicine

## 2018-02-09 ENCOUNTER — Telehealth: Payer: Self-pay

## 2018-02-09 NOTE — Telephone Encounter (Signed)
Spoke to pt. She went to see an [facility] office in [place]. Notes not scanned in, but unsure if they were faxed.

## 2018-02-09 NOTE — Telephone Encounter (Signed)
Find out some more information---like what type of injury (if she fell and mostly bruised her knee, PT may not be all that helpful). Also, find out if she went to an ortho urgent care or just saw NP/PA at regular urgent care. I usually like to decide if formal orthopedic evaluation is appropriate (depending on the degree of injury) before making referral (and most injuries get better without PT)

## 2018-02-09 NOTE — Telephone Encounter (Signed)
Depending on the diagnosis, I might be able to go ahead with a PT referral locally. I will have to see the note

## 2018-02-09 NOTE — Telephone Encounter (Signed)
Copied from [place] [id number]. Topic: General - Other >> Feb 09, 2018  7:25 AM Synthia Innocent wrote: CRM for notification. Patient fell on Friday, hurting her right knee. Was seen in a urgent care(notes will be sent), they suggest she has PT. Does she need to be seen or can Dr Silvio Pate place referral? If referral can be placed she is request a referral to [place].

## 2018-02-10 NOTE — Telephone Encounter (Signed)
Spoke to pt. She said they be sending the notes in the next few days after the doctor signs the notes.

## 2018-02-17 ENCOUNTER — Other Ambulatory Visit: Payer: Self-pay | Admitting: Internal Medicine

## 2018-02-22 ENCOUNTER — Other Ambulatory Visit: Payer: Self-pay | Admitting: Internal Medicine

## 2018-04-06 ENCOUNTER — Telehealth: Payer: Self-pay | Admitting: Internal Medicine

## 2018-04-06 NOTE — Telephone Encounter (Signed)
Pt dropped off form to be filled out. Placed in [place] tower.

## 2018-04-07 NOTE — Telephone Encounter (Signed)
Form placed in Dr Alla German Inbox on his desk

## 2018-04-08 ENCOUNTER — Ambulatory Visit: Payer: 59 | Admitting: Internal Medicine

## 2018-04-08 NOTE — Telephone Encounter (Signed)
I left a message on patient's voice mail to call back. I put in a CRM for patient to be told form is ready for pick up.

## 2018-04-08 NOTE — Telephone Encounter (Signed)
Form done No charge 

## 2018-04-23 NOTE — Telephone Encounter (Signed)
Pt called to let you know that she will contact the office back in January; she has to get her insurance in order before scheduling

## 2018-05-25 ENCOUNTER — Other Ambulatory Visit: Payer: Self-pay | Admitting: Internal Medicine

## 2018-05-25 DIAGNOSIS — Z1231 Encounter for screening mammogram for malignant neoplasm of breast: Secondary | ICD-10-CM

## 2018-07-07 ENCOUNTER — Ambulatory Visit
Admission: RE | Admit: 2018-07-07 | Discharge: 2018-07-07 | Disposition: A | Discharge status: Home / Self Care | Payer: 59 | Source: Ambulatory Visit | Attending: Internal Medicine | Admitting: Internal Medicine

## 2018-07-07 DIAGNOSIS — Z1231 Encounter for screening mammogram for malignant neoplasm of breast: Secondary | ICD-10-CM | POA: Diagnosis not present

## 2018-08-18 ENCOUNTER — Ambulatory Visit: Payer: 59 | Admitting: Internal Medicine

## 2018-08-18 ENCOUNTER — Encounter: Payer: Self-pay | Admitting: Internal Medicine

## 2018-08-18 ENCOUNTER — Ambulatory Visit (INDEPENDENT_AMBULATORY_CARE_PROVIDER_SITE_OTHER)
Admission: RE | Admit: 2018-08-18 | Discharge: 2018-08-18 | Disposition: A | Discharge status: Home / Self Care | Payer: 59 | Source: Ambulatory Visit | Attending: Internal Medicine | Admitting: Internal Medicine

## 2018-08-18 VITALS — BP 114/78 | HR 78 | Temp 97.7°F | Ht 65.75 in | Wt 197.0 lb

## 2018-08-18 DIAGNOSIS — R109 Unspecified abdominal pain: Secondary | ICD-10-CM

## 2018-08-18 MED ORDER — LINACLOTIDE 145 MCG PO CAPS: 145.0000 ug | ORAL_CAPSULE | Freq: Every day | ORAL | 11 refills | Status: DC

## 2018-08-18 NOTE — Assessment & Plan Note (Addendum)
Mostly epigastric pain but LLQ tenderness Certainly the history suggests obstipation. Did well with linzess but then couldn't afford it Could be diverticulitis Gallbladder disease is much less likely, as is pancreatitis Not suggestive of gastritis or ulcer (though did have anemia) Will check labs again and KUB  KUB doesn't show obstruction but does show considerable fecal burden Will do miralax prep like purge (taking every 2 hours) rx for the linzess again If still anemic, will proceed again with GI evaluation Consider imaging---especially if still anemic

## 2018-08-18 NOTE — Progress Notes (Signed)
Subjective:    Patient ID: Sharon Shepherd, female    DOB: [birth year], 68 y.o.   MRN: [id number]  HPI Here due to abdominal pain "my stomach has been in a mess" Started 4 days ago and bad 3 days ago Felt blocked---took 3 ex-lax--went a little bit. Then had bad pain--trouble even standing up Tried enema and suppository--had some reaction Also tried 1/2 bottle of magnesium citrate Some better but afraid to eat Eating does bring on symptoms---will feel it right in epigastrium (full uncomfortable feeling)  Fever 3 days ago No chills or sweats--or just slight Slight nausea--no vomiting  Never able to get the linzess due to cost Wants to try this again  Current Outpatient Medications on File Prior to Visit  Medication Sig Dispense Refill  . albuterol (PROVENTIL HFA;VENTOLIN HFA) 108 (90 Base) MCG/ACT inhaler Inhale 2 puffs into the lungs every 6 (six) hours as needed for wheezing or shortness of breath. 1 Inhaler 1  . aspirin 81 MG chewable tablet Chew 81 mg by mouth every other day.      Marland Kitchen atorvastatin (LIPITOR) 20 MG tablet TAKE 1 TABLET BY MOUTH  DAILY 90 tablet 3  . Cholecalciferol (VITAMIN D3) 5000 UNITS CAPS Take 1 tablet by mouth daily.    Marland Kitchen levothyroxine (SYNTHROID, LEVOTHROID) 75 MCG tablet TAKE 1 TABLET BY MOUTH  DAILY 90 tablet 3  . lisinopril-hydrochlorothiazide (PRINZIDE,ZESTORETIC) 10-12.5 MG tablet TAKE ONE-HALF TABLET BY  MOUTH DAILY 45 tablet 3  . montelukast (SINGULAIR) 10 MG tablet Take 1 tablet (10 mg total) by mouth at bedtime. 90 tablet 3  . linaclotide (LINZESS) 145 MCG CAPS capsule Take 1 capsule (145 mcg total) by mouth daily before breakfast. (Patient not taking: Reported on 08/18/2018) 30 capsule 11   No current facility-administered medications on file prior to visit.     Allergies  Allergen Reactions  . Codeine     REACTION: Itching    Past Medical History:  Diagnosis Date  . Anemia   . Celiac disease   . Diverticulitis   . GERD  (gastroesophageal reflux disease)   . Hyperlipidemia   . Hypertension   . Sleep disturbance   . Thyroiditis, lymphocytic    surgery 2013  . Vitamin B12 deficiency    unresponsive to oral replacement    Past Surgical History:  Procedure Laterality Date  . COLONOSCOPY    . ESOPHAGOGASTRODUODENOSCOPY  03/04   colon  . Laryngeal implant  2/13   after damage during thyroidectomy  . THYROIDECTOMY  12/12   Lymphocytic thyroiditis with goiter--Dr Rochel Brome    Family History  Problem Relation Age of Onset  . Stroke Father   . Colon cancer Neg Hx     Social History   Socioeconomic History  . Marital status: Married    Spouse name: Not on file  . Number of children: 2  . Years of education: Not on file  . Highest education level: Not on file  Occupational History  . 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 Financial resource strain: Not on file  . Food insecurity:    Worry: Not on file    Inability: Not on file  . Transportation needs:    Medical: Not on file    Non-medical: Not on file  Tobacco Use  . Smoking status: Never Smoker  . Smokeless tobacco: Never Used  Substance and Sexual Activity  . Alcohol use: Yes    Comment: rare  .  Drug use: No  . Sexual activity: Not on file  Lifestyle  . Physical activity:    Days per week: Not on file    Minutes per session: Not on file  . Stress: Not on file  Relationships  . Social connections:    Talks on phone: Not on file    Gets together: Not on file    Attends religious service: Not on file    Active member of club or organization: Not on file    Attends meetings of clubs or organizations: Not on file    Relationship status: Not on file  . Intimate partner violence:    Fear of current or ex partner: Not on file    Emotionally abused: Not on file    Physically abused: Not on file    Forced sexual activity: Not on file  Other Topics Concern  . Not on file  Social History Narrative  . Not on file    Review of Systems No cough or SOB Has lost a few pounds Sleeping okay---her usual not that much Typical stress at work--nothing new Didn't go to GI after found to be anemic    Objective:   Physical Exam  Constitutional: She appears well-developed. No distress.  Neck: No thyromegaly present.  Cardiovascular: Normal rate, regular rhythm and normal heart sounds. Exam reveals no gallop.  No murmur heard. Respiratory: Effort normal and breath sounds normal. No respiratory distress. She has no wheezes. She has no rales.  GI: Soft. Bowel sounds are normal. She exhibits no distension and no mass. There is no rebound and no guarding.  Mild LLQ tenderness only  Musculoskeletal:        General: No edema.  Lymphadenopathy:    She has no cervical adenopathy.           Assessment & Plan:

## 2018-08-18 NOTE — Patient Instructions (Signed)
Please take a capful of miralax every 2 hours (with full glass of water) for today and tomorrow. Then take mag citrate or a suppository if you haven't emptied well.

## 2018-08-19 LAB — IRON,TIBC AND FERRITIN PANEL
Ferritin: 29 ng/mL (ref 15–150)
IRON SATURATION: 5 % — AB (ref 15–55)
IRON: 22 ug/dL — AB (ref 27–139)
Total Iron Binding Capacity: 446 ug/dL (ref 250–450)
UIBC: 424 ug/dL — AB (ref 118–369)

## 2018-08-19 LAB — COMPREHENSIVE METABOLIC PANEL
ALBUMIN: 4.4 g/dL (ref 3.6–4.8)
ALK PHOS: 104 IU/L (ref 39–117)
ALT: 19 IU/L (ref 0–32)
AST: 24 IU/L (ref 0–40)
Albumin/Globulin Ratio: 1.8 (ref 1.2–2.2)
BILIRUBIN TOTAL: 0.7 mg/dL (ref 0.0–1.2)
BUN/Creatinine Ratio: 14 (ref 12–28)
BUN: 11 mg/dL (ref 8–27)
CHLORIDE: 100 mmol/L (ref 96–106)
CO2: 24 mmol/L (ref 20–29)
CREATININE: 0.77 mg/dL (ref 0.57–1.00)
Calcium: 9.6 mg/dL (ref 8.7–10.3)
GFR calc Af Amer: 94 mL/min/{1.73_m2} (ref >59)
GFR calc non Af Amer: 81 mL/min/{1.73_m2} (ref >59)
GLUCOSE: 94 mg/dL (ref 65–99)
Globulin, Total: 2.4 g/dL (ref 1.5–4.5)
Potassium: 4.1 mmol/L (ref 3.5–5.2)
Sodium: 140 mmol/L (ref 134–144)
TOTAL PROTEIN: 6.8 g/dL (ref 6.0–8.5)

## 2018-08-19 LAB — LIPASE: LIPASE: 27 U/L (ref 14–72)

## 2018-08-19 LAB — CBC
HEMOGLOBIN: 10.8 g/dL — AB (ref 11.1–15.9)
Hematocrit: 35 % (ref 34.0–46.6)
MCH: 20.6 pg — AB (ref 26.6–33.0)
MCHC: 30.9 g/dL — AB (ref 31.5–35.7)
MCV: 67 fL — ABNORMAL LOW (ref 79–97)
PLATELETS: 231 10*3/uL (ref 150–450)
RBC: 5.25 x10E6/uL (ref 3.77–5.28)
RDW: 17.5 % — ABNORMAL HIGH (ref 11.7–15.4)
WBC: 4 10*3/uL (ref 3.4–10.8)

## 2018-08-20 DIAGNOSIS — D509 Iron deficiency anemia, unspecified: Secondary | ICD-10-CM

## 2018-09-29 ENCOUNTER — Other Ambulatory Visit: Payer: 59

## 2018-09-29 ENCOUNTER — Ambulatory Visit: Payer: 59 | Admitting: Internal Medicine

## 2018-09-29 ENCOUNTER — Encounter: Payer: Self-pay | Admitting: Internal Medicine

## 2018-09-29 VITALS — BP 110/70 | HR 76 | Ht 65.75 in | Wt 202.6 lb

## 2018-09-29 DIAGNOSIS — D508 Other iron deficiency anemias: Secondary | ICD-10-CM | POA: Diagnosis not present

## 2018-09-29 DIAGNOSIS — K900 Celiac disease: Secondary | ICD-10-CM | POA: Diagnosis not present

## 2018-09-29 NOTE — Progress Notes (Signed)
Sharon Shepherd 68 y.o. [birth year] [id number]  Assessment & Plan:   Encounter Diagnoses  Name Primary?  . Other iron deficiency anemia Yes  . Celiac disease     The cause of her iron deficiency is unclear.  I wonder if it is not uncontrolled celiac disease.  It probably is.  She had biopsies suggested that but as best I can remember and tell from chart review only had positive antigliadin antibodies in the past which are not as sensitive and specific as endomysial or tissue transglutaminase antibodies.  Recent blood donation may have contributed some but certainly not solely responsible.  Further evaluation with endoscopic studies is reasonable.  Hopefully they will be negative but occult blood loss from gastrointestinal neoplasia/cancer is possible and should be excluded.  Other lesions that could cause chronic blood loss will also be looked for.  EGD and colonoscopy will be performed.   The risks and benefits as well as alternatives of endoscopic procedure(s) have been discussed and reviewed. All questions answered. The patient agrees to proceed.  Given her diagnosis of celiac disease and less specific antibodies being positive I am going to do a celiac panel as well as the HLA DQ testing.  I appreciate the opportunity to care for this patient. CC: Sharon Carbon, MD    Subjective:   Chief Complaint: Iron deficiency anemia HPI This 68 year old white woman carries a diagnosis of celiac disease based upon gliadin antibody abnormalities and an abnormal duodenal biopsy in 2004.  She has had a history of iron and B12 deficiency.  She was iron deficient in 2013.  A colonoscopy in 2015 was negative for any neoplasia or bleeding lesions.  Last EGD 2004.  She has taken iron supplements in the past and also had iron infusions.  She donated blood 3 times last year but has not been a regular blood donor and does not plan to donate again.  She does eat iron-containing foods but is not  compliant with a gluten-free diet.  A son has celiac disease also.  She was treated with some iron supplements which exacerbated her chronic constipation which is now currently under good control with Linzess.  Allergies  Allergen Reactions  . Codeine     REACTION: Itching   Current Meds  Medication Sig  . albuterol (PROVENTIL HFA;VENTOLIN HFA) 108 (90 Base) MCG/ACT inhaler Inhale 2 puffs into the lungs every 6 (six) hours as needed for wheezing or shortness of breath.  Marland Kitchen aspirin 81 MG chewable tablet Chew 81 mg by mouth every other day.    Marland Kitchen atorvastatin (LIPITOR) 20 MG tablet TAKE 1 TABLET BY MOUTH  DAILY  . Cholecalciferol (VITAMIN D3) 5000 UNITS CAPS Take 1 tablet by mouth daily.  Marland Kitchen levothyroxine (SYNTHROID, LEVOTHROID) 75 MCG tablet TAKE 1 TABLET BY MOUTH  DAILY  . linaclotide (LINZESS) 145 MCG CAPS capsule Take 1 capsule (145 mcg total) by mouth daily before breakfast.  . lisinopril-hydrochlorothiazide (PRINZIDE,ZESTORETIC) 10-12.5 MG tablet TAKE ONE-HALF TABLET BY  MOUTH DAILY  . montelukast (SINGULAIR) 10 MG tablet Take 1 tablet (10 mg total) by mouth at bedtime.   Past Medical History:  Diagnosis Date  . Anemia   . Celiac disease   . Diverticulitis   . GERD (gastroesophageal reflux disease)   . Hyperlipidemia   . Hypertension   . Sleep disturbance   . Thyroiditis, lymphocytic    surgery 2013  . Vitamin B12 deficiency    unresponsive to oral replacement   Past Surgical  History:  Procedure Laterality Date  . COLONOSCOPY    . ESOPHAGOGASTRODUODENOSCOPY  03/04   colon  . Laryngeal implant  2/13   after damage during thyroidectomy  . THYROIDECTOMY  12/12   Lymphocytic thyroiditis with goiter--Dr [place] History   Social History Narrative   The patient is married and has 2 children.   She is employed in Librarian, academic at [employer]   Rare alcohol and never tobacco   family history includes Stroke in her father.   Review of Systems As per HPI.   She has had some mild fatigue.  All other review of systems negative  Objective:   Physical Exam  '@BP'  110/70   Pulse 76   Ht 5' 5.75" (1.67 m)   Wt 202 lb 9.6 oz (91.9 kg)   SpO2 96%   BMI 32.95 kg/m @  General:  NAD Eyes:   anicteric Lungs:  clear Heart::  S1S2 no rubs, murmurs or gallops Abdomen:  soft and nontender, BS+ Ext:   no edema, cyanosis or clubbing Neuro:  A and O x 3 Psych:  NL affect    Data Reviewed:  See HPI

## 2018-09-29 NOTE — Patient Instructions (Signed)
You have been scheduled for an endoscopy and colonoscopy. Please follow the written instructions given to you at your visit today. Please pick up your prep supplies at the pharmacy within the next 1-3 days. If you use inhalers (even only as needed), please bring them with you on the day of your procedure.   Your provider has requested that you go to the basement level for lab work before leaving today. Press "B" on the elevator. The lab is located at the first door on the left as you exit the elevator.  I appreciate the opportunity to care for you. Carl Gessner, MD, FACG 

## 2018-10-07 LAB — CELIAC DISEASE HLA DQ ASSOC.
DQ2 (DQA1 0501/0505, DQB1 02XX): POSITIVE
DQ8 (DQA1 03XX, DQB1 0302): NEGATIVE

## 2018-10-07 LAB — CELIAC DISEASE COMPREHENSIVE PANEL WITH REFLEXES: IGA/IMMUNOGLOBULIN A, SERUM: 223 mg/dL (ref 87–352)

## 2018-10-07 NOTE — Progress Notes (Signed)
My Chart note Neg TTG Ab's Susceptible to Crohn's

## 2018-10-20 ENCOUNTER — Encounter: Payer: Self-pay | Admitting: Family Medicine

## 2018-10-20 ENCOUNTER — Ambulatory Visit: Payer: 59 | Admitting: Family Medicine

## 2018-10-20 VITALS — BP 120/82 | HR 85 | Temp 98.1°F | Resp 20 | Ht 65.75 in | Wt 202.2 lb

## 2018-10-20 DIAGNOSIS — B9789 Other viral agents as the cause of diseases classified elsewhere: Secondary | ICD-10-CM | POA: Diagnosis not present

## 2018-10-20 DIAGNOSIS — J019 Acute sinusitis, unspecified: Secondary | ICD-10-CM

## 2018-10-20 MED ORDER — ALBUTEROL SULFATE HFA 108 (90 BASE) MCG/ACT IN AERS: 2.0000 | INHALATION_SPRAY | Freq: Four times a day (QID) | RESPIRATORY_TRACT | 1 refills | Status: DC | PRN

## 2018-10-20 MED ORDER — AMOXICILLIN-POT CLAVULANATE 875-125 MG PO TABS: 1.0000 | ORAL_TABLET | Freq: Two times a day (BID) | ORAL | 0 refills | Status: AC

## 2018-10-20 NOTE — Patient Instructions (Signed)

## 2018-10-20 NOTE — Progress Notes (Signed)
Subjective:     Sharon Shepherd is a 68 y.o. female presenting for Cough (symptoms started on 10/15/2018. Sore throat, runny nose, post nasal drip, headache a little bit, ear pain, cough, dyspnea. Not sure of any fever. No body aches.)     Cough  This is a new problem. The current episode started in the past 7 days. The cough is non-productive. Associated symptoms include ear pain, headaches, nasal congestion, postnasal drip, rhinorrhea, a sore throat, shortness of breath and wheezing. Pertinent negatives include no chills, fever or myalgias. She has tried a beta-agonist inhaler for the symptoms. The treatment provided mild relief. Her past medical history is significant for environmental allergies. There is no history of asthma or COPD.   Vocal cord implant Feels like the congestion making it harder to breathe  Review of Systems  Constitutional: Negative for chills and fever.  HENT: Positive for ear pain, postnasal drip, rhinorrhea, sore throat and trouble swallowing. Negative for sinus pressure and sinus pain.   Respiratory: Positive for cough, shortness of breath and wheezing.   Gastrointestinal: Negative for constipation, diarrhea, nausea and vomiting.  Musculoskeletal: Negative for myalgias.  Allergic/Immunologic: Positive for environmental allergies.  Neurological: Positive for headaches.     Social History   Tobacco Use  Smoking Status Never Smoker  Smokeless Tobacco Never Used        Objective:    BP Readings from Last 3 Encounters:  10/20/18 120/82  09/29/18 110/70  08/18/18 114/78   Wt Readings from Last 3 Encounters:  10/20/18 202 lb 4 oz (91.7 kg)  09/29/18 202 lb 9.6 oz (91.9 kg)  08/18/18 197 lb (89.4 kg)    BP 120/82   Pulse 85   Temp 98.1 F (36.7 C)   Resp 20   Ht 5' 5.75" (1.67 m)   Wt 202 lb 4 oz (91.7 kg)   SpO2 98%   BMI 32.89 kg/m    Physical Exam Constitutional:      General: She is not in acute distress.    Appearance: She  is well-developed. She is not diaphoretic.  HENT:     Head: Normocephalic and atraumatic.     Right Ear: Tympanic membrane and ear canal normal.     Left Ear: Tympanic membrane and ear canal normal.     Nose: Mucosal edema and rhinorrhea present.     Right Sinus: No maxillary sinus tenderness or frontal sinus tenderness.     Left Sinus: No maxillary sinus tenderness or frontal sinus tenderness.     Mouth/Throat:     Pharynx: Uvula midline. Posterior oropharyngeal erythema present. No oropharyngeal exudate.     Tonsils: Swelling: 0 on the right. 0 on the left.  Eyes:     General: No scleral icterus.    Conjunctiva/sclera: Conjunctivae normal.  Neck:     Musculoskeletal: Neck supple.  Cardiovascular:     Rate and Rhythm: Normal rate and regular rhythm.     Heart sounds: Normal heart sounds. No murmur.  Pulmonary:     Effort: Pulmonary effort is normal. No respiratory distress.     Breath sounds: Normal breath sounds.  Lymphadenopathy:     Cervical: No cervical adenopathy.  Skin:    General: Skin is warm and dry.     Capillary Refill: Capillary refill takes less than 2 seconds.  Neurological:     Mental Status: She is alert.           Assessment & Plan:   Problem List  Items Addressed This Visit    None    Visit Diagnoses    Acute viral sinusitis    -  Primary   Relevant Medications   amoxicillin-clavulanate (AUGMENTIN) 875-125 MG tablet   albuterol (PROVENTIL HFA;VENTOLIN HFA) 108 (90 Base) MCG/ACT inhaler     Discussed that likely viral process. Abx provided if not improving over the next 2-3 days then start.   Albuterol refill as helped in the past and uncertain if current one is expired with date of 2017 on canister  Return if symptoms worsen or fail to improve.  Lesleigh Noe, MD

## 2018-11-13 ENCOUNTER — Encounter: Payer: 59 | Admitting: Internal Medicine

## 2018-12-04 ENCOUNTER — Other Ambulatory Visit: Payer: Self-pay | Admitting: Internal Medicine

## 2018-12-25 ENCOUNTER — Other Ambulatory Visit: Payer: Self-pay | Admitting: Internal Medicine

## 2018-12-25 ENCOUNTER — Telehealth: Payer: Self-pay | Admitting: *Deleted

## 2018-12-25 MED ORDER — DIAZEPAM 5 MG PO TABS: 5.0000 mg | ORAL_TABLET | Freq: Three times a day (TID) | ORAL | 0 refills | Status: DC | PRN

## 2018-12-25 NOTE — Telephone Encounter (Signed)
Patient's husband called stating that patient did a Web MD visit today and was diagnosed with vertigo. Sharon Shepherd stated that they called meclizine in for her. Sharon Shepherd stated that the patient took the medication about 2 hours ago and it has not helped at all. Sharon Shepherd stated that she is nauseated and can't hardly sit up in bed. Patient's husband wants to know what Sharon Shepherd recommends?  Patient stated that the symptoms started about 4:00 am this morning and the room was spinning. Patient stated that she has had vertigo before many years ago and this is different.

## 2018-12-25 NOTE — Telephone Encounter (Signed)
Patient's husband notified as instructed by telephone and verbalized understanding. Was advised by Mr. Badon that patient is not able to get out of bed and he was thinking about calling EMS, but will try what Dr. Silvio Pate has recommended. Advised Mr. Steinke if patient does not improve then he should do what he feels is right for her. Advised Mr. Megill if patient does not improve to call the office about her being seen..  Advised Mr. Cadogan to call our office and they will  put him in contact with the office doing visits on Saturday and he verbalized understanding.

## 2018-12-25 NOTE — Telephone Encounter (Signed)
Please let her know that if she tried 25mg  of meclizine--she should try an extra 25mg  right away (or maybe even take 50mg  now since the first dose was hours ago). I did send a prescription for valium to [facility] on [address] for her to use if the meclizine doesn't work. She may need an in person evaluation if she is not getting any better

## 2018-12-30 ENCOUNTER — Other Ambulatory Visit: Payer: Self-pay | Admitting: Internal Medicine

## 2019-01-20 ENCOUNTER — Telehealth: Payer: Self-pay | Admitting: *Deleted

## 2019-01-20 ENCOUNTER — Other Ambulatory Visit: Payer: Self-pay

## 2019-01-20 ENCOUNTER — Ambulatory Visit (INDEPENDENT_AMBULATORY_CARE_PROVIDER_SITE_OTHER): Payer: 59 | Admitting: Internal Medicine

## 2019-01-20 ENCOUNTER — Encounter: Payer: Self-pay | Admitting: Internal Medicine

## 2019-01-20 VITALS — BP 130/88 | HR 67 | Temp 97.6°F | Ht 65.5 in | Wt 204.0 lb

## 2019-01-20 DIAGNOSIS — Z23 Encounter for immunization: Secondary | ICD-10-CM

## 2019-01-20 DIAGNOSIS — E890 Postprocedural hypothyroidism: Secondary | ICD-10-CM

## 2019-01-20 DIAGNOSIS — I10 Essential (primary) hypertension: Secondary | ICD-10-CM | POA: Diagnosis not present

## 2019-01-20 DIAGNOSIS — K900 Celiac disease: Secondary | ICD-10-CM | POA: Diagnosis not present

## 2019-01-20 DIAGNOSIS — Z0000 Encounter for general adult medical examination without abnormal findings: Secondary | ICD-10-CM | POA: Diagnosis not present

## 2019-01-20 MED ORDER — MECLIZINE HCL 25 MG PO TABS: 25.0000 mg | ORAL_TABLET | Freq: Three times a day (TID) | ORAL | 0 refills | Status: DC | PRN

## 2019-01-20 NOTE — Addendum Note (Signed)
Addended by: Pilar Grammes on: 01/20/2019 09:26 AM   Modules accepted: Orders

## 2019-01-20 NOTE — Telephone Encounter (Signed)
Spoke with patient and notified her that Dr.Letvak wants her to have Endo&colonoscopy. Patient states she does not want to schedule this at this time. She is having blood work and wants to wait for the results and also she wants to call us back in July to make the  Appointment for August because her granddaughter is going to college so she does not want to have this done until after that.  encouraged patient to call us back to schedule this that it is important. Pt understands.

## 2019-01-20 NOTE — Telephone Encounter (Signed)
Gatha Mayer, MD  [name], MD  Cc: Levonne Spiller, RN        Thanks Denice Paradise,   I will have my staff arrange this   EGD/Colonoscopy - iron deficiency anemia - see 09/2018 note     Glendell Docker

## 2019-01-20 NOTE — Assessment & Plan Note (Signed)
Will check labs

## 2019-01-20 NOTE — Assessment & Plan Note (Signed)
Feels good Discussed exercise prevnar today Annual flu vaccine Is going to be scheduled for colon Mammogram due 11/21 One last pap---gyn will do

## 2019-01-20 NOTE — Assessment & Plan Note (Signed)
BP Readings from Last 3 Encounters:  01/20/19 130/88  10/20/18 120/82  09/29/18 110/70   Good control

## 2019-01-20 NOTE — Assessment & Plan Note (Signed)
Positive biopsy Tries to limit gluten

## 2019-01-20 NOTE — Progress Notes (Signed)
Subjective:    Patient ID: Sharon Shepherd, female    DOB: 08-29-1950, 68 y.o.   MRN: [id number]  HPI Here for physical Still working---from home. Considering retirement or slowing down.  Reviewed GI work up for anemia TTG was negative but she remembers a positive small bowel biopsy She avoids gluten---but imperfectly  Feels fine linzess for her bowels---satisfied with this  Did have a horrible case of vertigo at 4AM one morning after turning in bed About 3-4 weeks ago Called in her health line--got meclizine (double dose helped)  Current Outpatient Medications on File Prior to Visit  Medication Sig Dispense Refill  . albuterol (PROVENTIL HFA;VENTOLIN HFA) 108 (90 Base) MCG/ACT inhaler Inhale 2 puffs into the lungs every 6 (six) hours as needed for wheezing or shortness of breath. 1 Inhaler 1  . aspirin 81 MG chewable tablet Chew 81 mg by mouth every other day.      Marland Kitchen atorvastatin (LIPITOR) 20 MG tablet TAKE 1 TABLET BY MOUTH  DAILY 90 tablet 0  . Cholecalciferol (VITAMIN D3) 5000 UNITS CAPS Take 1 tablet by mouth daily.    . diazepam (VALIUM) 5 MG tablet Take 1 tablet (5 mg total) by mouth 3 (three) times daily as needed. For severe vertigo 30 tablet 0  . levothyroxine (SYNTHROID) 75 MCG tablet TAKE 1 TABLET BY MOUTH  DAILY 90 tablet 0  . linaclotide (LINZESS) 145 MCG CAPS capsule Take 1 capsule (145 mcg total) by mouth daily before breakfast. 30 capsule 11  . lisinopril-hydrochlorothiazide (ZESTORETIC) 10-12.5 MG tablet TAKE ONE-HALF TABLET BY  MOUTH DAILY 45 tablet 0   No current facility-administered medications on file prior to visit.     Allergies  Allergen Reactions  . Codeine     REACTION: Itching    Past Medical History:  Diagnosis Date  . Anemia   . Celiac disease   . Diverticulitis   . GERD (gastroesophageal reflux disease)   . Hyperlipidemia   . Hypertension   . Sleep disturbance   . Thyroiditis, lymphocytic    surgery 2013  . Vitamin B12  deficiency    unresponsive to oral replacement    Past Surgical History:  Procedure Laterality Date  . COLONOSCOPY    . ESOPHAGOGASTRODUODENOSCOPY  03/04   colon  . Laryngeal implant  2/13   after damage during thyroidectomy  . THYROIDECTOMY  12/12   Lymphocytic thyroiditis with goiter--Dr Rochel Brome    Family History  Problem Relation Age of Onset  . Stroke Father   . Colon cancer Neg Hx     Social History   Socioeconomic History  . Marital status: Married    Spouse name: Not on file  . Number of children: 2  . Years of education: Not on file  . Highest education level: Not on file  Occupational History  . 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 Financial resource strain: Not on file  . Food insecurity:    Worry: Not on file    Inability: Not on file  . Transportation needs:    Medical: Not on file    Non-medical: Not on file  Tobacco Use  . Smoking status: Never Smoker  . Smokeless tobacco: Never Used  Substance and Sexual Activity  . Alcohol use: Yes    Comment: rare  . Drug use: No  . Sexual activity: Not on file  Lifestyle  . Physical activity:    Days per week: Not on file  Minutes per session: Not on file  . Stress: Not on file  Relationships  . Social connections:    Talks on phone: Not on file    Gets together: Not on file    Attends religious service: Not on file    Active member of club or organization: Not on file    Attends meetings of clubs or organizations: Not on file    Relationship status: Not on file  . Intimate partner violence:    Fear of current or ex partner: Not on file    Emotionally abused: Not on file    Physically abused: Not on file    Forced sexual activity: Not on file  Other Topics Concern  . Not on file  Social History Narrative   The patient is married and has 2 children.   She is employed in Librarian, academic at [employer]   Rare alcohol and never tobacco   Review of Systems   Constitutional: Negative for fatigue.       Weight up slightly Walking some Wears seat belt  HENT: Positive for tinnitus. Negative for dental problem and hearing loss.   Eyes: Negative for visual disturbance.       No diplopia or unilateral vision loss  Respiratory: Negative for cough, chest tightness and shortness of breath.   Cardiovascular: Negative for chest pain and leg swelling.       Feels a flutter at times--at rest  Gastrointestinal: Negative for abdominal pain and blood in stool.       No sig heartburn  Endocrine: Negative for polydipsia and polyuria.  Genitourinary: Negative for difficulty urinating, dyspareunia, dysuria and hematuria.  Musculoskeletal: Negative for joint swelling.       Some back pain after cleaning pool  Skin: Negative for rash.  Allergic/Immunologic: Positive for environmental allergies. Negative for immunocompromised state.       Uses flonase as needed  Neurological: Positive for dizziness. Negative for syncope and headaches.  Hematological: Negative for adenopathy. Does not bruise/bleed easily.  Psychiatric/Behavioral: Positive for sleep disturbance. Negative for dysphoric mood. The patient is not nervous/anxious.        Chronic sleep problems       Objective:   Physical Exam  Constitutional: She appears well-developed. No distress.  HENT:  Head: Normocephalic and atraumatic.  Right Ear: External ear normal.  Left Ear: External ear normal.  Mouth/Throat: Oropharynx is clear and moist. No oropharyngeal exudate.  Eyes: Pupils are equal, round, and reactive to light. Conjunctivae are normal.  Neck: No thyromegaly present.  Cardiovascular: Normal rate, regular rhythm, normal heart sounds and intact distal pulses. Exam reveals no gallop.  No murmur heard. Respiratory: Effort normal and breath sounds normal. No respiratory distress. She has no wheezes. She has no rales.  GI: Soft. There is no abdominal tenderness.  Musculoskeletal:        General:  No tenderness or edema.  Lymphadenopathy:    She has no cervical adenopathy.  Skin: No rash noted. No erythema.  Psychiatric: She has a normal mood and affect. Her behavior is normal.           Assessment & Plan:

## 2019-01-21 LAB — COMPREHENSIVE METABOLIC PANEL
ALT: 20 IU/L (ref 0–32)
AST: 26 IU/L (ref 0–40)
Albumin/Globulin Ratio: 2 (ref 1.2–2.2)
Albumin: 4.5 g/dL (ref 3.8–4.8)
Alkaline Phosphatase: 106 IU/L (ref 39–117)
BUN/Creatinine Ratio: 16 (ref 12–28)
BUN: 11 mg/dL (ref 8–27)
Bilirubin Total: 0.6 mg/dL (ref 0.0–1.2)
CO2: 24 mmol/L (ref 20–29)
Calcium: 9.6 mg/dL (ref 8.7–10.3)
Chloride: 105 mmol/L (ref 96–106)
Creatinine, Ser: 0.7 mg/dL (ref 0.57–1.00)
GFR calc Af Amer: 105 mL/min/{1.73_m2} (ref >59)
GFR calc non Af Amer: 91 mL/min/{1.73_m2} (ref >59)
Globulin, Total: 2.3 g/dL (ref 1.5–4.5)
Glucose: 90 mg/dL (ref 65–99)
Potassium: 4.3 mmol/L (ref 3.5–5.2)
Sodium: 142 mmol/L (ref 134–144)
Total Protein: 6.8 g/dL (ref 6.0–8.5)

## 2019-01-21 LAB — CBC
Hematocrit: 35.4 % (ref 34.0–46.6)
Hemoglobin: 10.8 g/dL — ABNORMAL LOW (ref 11.1–15.9)
MCH: 21 pg — ABNORMAL LOW (ref 26.6–33.0)
MCHC: 30.5 g/dL — ABNORMAL LOW (ref 31.5–35.7)
MCV: 69 fL — ABNORMAL LOW (ref 79–97)
Platelets: 196 10*3/uL (ref 150–450)
RBC: 5.14 x10E6/uL (ref 3.77–5.28)
RDW: 17.9 % — ABNORMAL HIGH (ref 11.7–15.4)
WBC: 3.8 10*3/uL (ref 3.4–10.8)

## 2019-01-21 LAB — SAR COV2 SEROLOGY (COVID19)AB(IGG),IA: SARS-CoV-2 Ab, IgG: NEGATIVE

## 2019-01-21 LAB — T4, FREE: Free T4: 1.23 ng/dL (ref 0.82–1.77)

## 2019-01-21 LAB — TSH: TSH: 0.171 u[IU]/mL — ABNORMAL LOW (ref 0.450–4.500)

## 2019-02-22 ENCOUNTER — Other Ambulatory Visit: Payer: Self-pay

## 2019-02-22 ENCOUNTER — Encounter: Payer: Self-pay | Admitting: Internal Medicine

## 2019-02-22 ENCOUNTER — Ambulatory Visit: Payer: 59 | Admitting: Internal Medicine

## 2019-02-22 VITALS — BP 124/86 | HR 83 | Temp 98.9°F | Ht 65.5 in | Wt 201.0 lb

## 2019-02-22 DIAGNOSIS — W57XXXS Bitten or stung by nonvenomous insect and other nonvenomous arthropods, sequela: Secondary | ICD-10-CM | POA: Diagnosis not present

## 2019-02-22 DIAGNOSIS — R21 Rash and other nonspecific skin eruption: Secondary | ICD-10-CM | POA: Diagnosis not present

## 2019-02-22 DIAGNOSIS — S20469A Insect bite (nonvenomous) of unspecified back wall of thorax, initial encounter: Secondary | ICD-10-CM

## 2019-02-22 MED ORDER — SERTACONAZOLE NITRATE 2 % EX CREA: 1.0000 "application " | TOPICAL_CREAM | Freq: Every day | CUTANEOUS | 0 refills | Status: DC

## 2019-02-22 MED ORDER — DOXYCYCLINE HYCLATE 100 MG PO TABS: 100.0000 mg | ORAL_TABLET | Freq: Two times a day (BID) | ORAL | 0 refills | Status: DC

## 2019-02-22 NOTE — Patient Instructions (Signed)

## 2019-02-22 NOTE — Progress Notes (Signed)
Subjective:    Patient ID: Sharon Shepherd, female    DOB: 04/09/1951, 68 y.o.   MRN: [id number]  HPI  Patient presents to the clinic today with c/o an itchy rash to her back. This started 3 days ago. The rash has not spread. She reports no changes in soaps, detergents, or lotions.  She does spend time outdoors regularly and has a dog.  She reports pulling a tick off of her about 1 month ago.  She denies any joint pain, fatigue, nausea, diarrhea or fevers.  She has not tried anything OTC for this.   Review of Systems      Past Medical History:  Diagnosis Date  . Anemia   . Celiac disease   . Diverticulitis   . GERD (gastroesophageal reflux disease)   . Hyperlipidemia   . Hypertension   . Sleep disturbance   . Thyroiditis, lymphocytic    surgery 2013  . Vitamin B12 deficiency    unresponsive to oral replacement    Current Outpatient Medications  Medication Sig Dispense Refill  . albuterol (PROVENTIL HFA;VENTOLIN HFA) 108 (90 Base) MCG/ACT inhaler Inhale 2 puffs into the lungs every 6 (six) hours as needed for wheezing or shortness of breath. 1 Inhaler 1  . aspirin 81 MG chewable tablet Chew 81 mg by mouth every other day.      Marland Kitchen atorvastatin (LIPITOR) 20 MG tablet TAKE 1 TABLET BY MOUTH  DAILY 90 tablet 0  . Cholecalciferol (VITAMIN D3) 5000 UNITS CAPS Take 1 tablet by mouth daily.    . diazepam (VALIUM) 5 MG tablet Take 1 tablet (5 mg total) by mouth 3 (three) times daily as needed. For severe vertigo 30 tablet 0  . levothyroxine (SYNTHROID) 75 MCG tablet TAKE 1 TABLET BY MOUTH  DAILY 90 tablet 0  . linaclotide (LINZESS) 145 MCG CAPS capsule Take 1 capsule (145 mcg total) by mouth daily before breakfast. 30 capsule 11  . lisinopril-hydrochlorothiazide (ZESTORETIC) 10-12.5 MG tablet TAKE ONE-HALF TABLET BY  MOUTH DAILY 45 tablet 0  . meclizine (ANTIVERT) 25 MG tablet Take 1-2 tablets (25-50 mg total) by mouth 3 (three) times daily as needed. For vertigo 60 tablet 0  .  doxycycline (VIBRA-TABS) 100 MG tablet Take 1 tablet (100 mg total) by mouth 2 (two) times daily. 28 tablet 0  . Sertaconazole Nitrate 2 % CREA Apply 1 application topically daily. 30 g 0   No current facility-administered medications for this visit.     Allergies  Allergen Reactions  . Codeine     REACTION: Itching    Family History  Problem Relation Age of Onset  . Stroke Father   . Colon cancer Neg Hx     Social History   Socioeconomic History  . Marital status: Married    Spouse name: Not on file  . Number of children: 2  . Years of education: Not on file  . Highest education level: Not on file  Occupational History  . 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 Financial resource strain: Not on file  . Food insecurity    Worry: Not on file    Inability: Not on file  . Transportation needs    Medical: Not on file    Non-medical: Not on file  Tobacco Use  . Smoking status: Never Smoker  . Smokeless tobacco: Never Used  Substance and Sexual Activity  . Alcohol use: Yes    Comment: rare  . Drug  use: No  . Sexual activity: Not on file  Lifestyle  . Physical activity    Days per week: Not on file    Minutes per session: Not on file  . Stress: Not on file  Relationships  . Social Herbalist on phone: Not on file    Gets together: Not on file    Attends religious service: Not on file    Active member of club or organization: Not on file    Attends meetings of clubs or organizations: Not on file    Relationship status: Not on file  . Intimate partner violence    Fear of current or ex partner: Not on file    Emotionally abused: Not on file    Physically abused: Not on file    Forced sexual activity: Not on file  Other Topics Concern  . Not on file  Social History Narrative   The patient is married and has 2 children.   She is employed in Librarian, academic at [employer]   Rare alcohol and never tobacco     Constitutional: Denies  fever, malaise, fatigue, headache or abrupt weight changes.  Skin: Complains of an itchy rash to her back in two different spots.  Denies burning or pain.  Neurological: Denies dizziness, difficulty with memory, difficulty with speech or problems with balance and coordination.    No other specific complaints in a complete review of systems (except as listed in HPI above).  Objective:   Physical Exam   BP 124/86 (BP Location: Left Arm, Patient Position: Sitting, Cuff Size: Normal)   Pulse 83   Temp 98.9 F (37.2 C) (Temporal)   Ht 5' 5.5" (1.664 m)   Wt 201 lb (91.2 kg)   SpO2 99%   BMI 32.94 kg/m  Wt Readings from Last 3 Encounters:  02/22/19 201 lb (91.2 kg)  01/20/19 204 lb (92.5 kg)  10/20/18 202 lb 4 oz (91.7 kg)    General: Appears her stated age, well developed, well nourished in NAD. Skin: 2 large areas to left scapula and right flank almost identical that are warm to touch, red, slightly raised with some central clearing.   BMET    Component Value Date/Time   NA 142 01/20/2019 0919   K 4.3 01/20/2019 0919   K 3.8 10/17/2011 1205   CL 105 01/20/2019 0919   CO2 24 01/20/2019 0919   GLUCOSE 90 01/20/2019 0919   BUN 11 01/20/2019 0919   CREATININE 0.70 01/20/2019 0919   CREATININE 0.85 07/21/2012 1209   CALCIUM 9.6 01/20/2019 0919   GFRNONAA 91 01/20/2019 0919   GFRNONAA >60 07/21/2012 1209   GFRAA 105 01/20/2019 0919   GFRAA >60 07/21/2012 1209    Lipid Panel     Component Value Date/Time   CHOL 140 01/14/2018 1242   TRIG 72 01/14/2018 1242   HDL 57 01/14/2018 1242   CHOLHDL 2.5 01/14/2018 1242   LDLCALC 69 01/14/2018 1242    CBC    Component Value Date/Time   WBC 3.8 01/20/2019 0919   WBC 3.3 (L) 07/21/2012 1209   RBC 5.14 01/20/2019 0919   RBC 5.11 07/21/2012 1209   HGB 10.8 (L) 01/20/2019 0919   HCT 35.4 01/20/2019 0919   PLT 196 01/20/2019 0919   MCV 69 (L) 01/20/2019 0919   MCV 75 (L) 07/21/2012 1209   MCH 21.0 (L) 01/20/2019 0919    MCH 24.5 (L) 07/21/2012 1209   MCHC 30.5 (L) 01/20/2019 0919  MCHC 32.8 07/21/2012 1209   RDW 17.9 (H) 01/20/2019 0919   RDW 15.4 (H) 07/21/2012 1209   LYMPHSABS 1.8 01/13/2017 1213   LYMPHSABS 1.4 07/21/2012 1209   MONOABS 0.4 07/21/2012 1209   EOSABS 0.2 01/13/2017 1213   EOSABS 0.0 07/21/2012 1209   BASOSABS 0.0 01/13/2017 1213   BASOSABS 0.0 07/21/2012 1209    Hgb A1C No results found for: HGBA1C         Assessment & Plan:   Rash of Back:  Suspect tick-borne illness vs fungal infection? Rx for Doxycycline 100 mg BID x 14 days  Rx for Sertaconazole cream daily prn. Will check B Burgdorferi, RMSF and Ehrlichia AB  Return precautions discussed.  Webb Silversmith, NP

## 2019-02-24 LAB — LYME AB/WESTERN BLOT REFLEX
LYME DISEASE AB, QUANT, IGM: <0.8 index (ref 0.00–0.79)
Lyme IgG/IgM Ab: <0.91 {ISR} (ref 0.00–0.90)

## 2019-02-24 LAB — EHRLICHIA ANTIBODY PANEL
E. Chaffeensis (HME) IgM Titer: NEGATIVE
E.Chaffeensis (HME) IgG: NEGATIVE
HGE IgG Titer: NEGATIVE
HGE IgM Titer: NEGATIVE

## 2019-02-24 LAB — ROCKY MTN SPOTTED FVR ABS PNL(IGG+IGM)
RMSF IgG: NEGATIVE
RMSF IgM: 1.26 index — ABNORMAL HIGH (ref 0.00–0.89)

## 2019-02-25 ENCOUNTER — Encounter: Payer: Self-pay | Admitting: Internal Medicine

## 2019-02-27 ENCOUNTER — Other Ambulatory Visit: Payer: Self-pay | Admitting: Internal Medicine

## 2019-03-03 NOTE — Telephone Encounter (Signed)
I spoke to patient to see if she wanted to schedule EGD/Colon. Patient states that she will call back to schedule.

## 2019-03-20 ENCOUNTER — Other Ambulatory Visit: Payer: Self-pay | Admitting: Internal Medicine

## 2019-03-25 ENCOUNTER — Other Ambulatory Visit: Payer: Self-pay | Admitting: Internal Medicine

## 2019-04-13 NOTE — Progress Notes (Signed)
We have called her - it was a while ago - she declined to schedule and said she would call back  FYI

## 2019-06-21 ENCOUNTER — Encounter: Payer: Self-pay | Admitting: Internal Medicine

## 2019-06-21 ENCOUNTER — Ambulatory Visit: Payer: 59 | Admitting: Internal Medicine

## 2019-06-21 ENCOUNTER — Other Ambulatory Visit: Payer: Self-pay

## 2019-06-21 DIAGNOSIS — K5909 Other constipation: Secondary | ICD-10-CM | POA: Insufficient documentation

## 2019-06-21 MED ORDER — LINACLOTIDE 290 MCG PO CAPS: 290.0000 ug | ORAL_CAPSULE | Freq: Every day | ORAL | 3 refills | Status: DC

## 2019-06-21 NOTE — Assessment & Plan Note (Addendum)
Had done well with linzess but 3 days of worsening No apparent acute illness No evidence of obstruction  Will add miralax dosing every few hours and double linzess Back to GI if ongoing problems

## 2019-06-21 NOTE — Progress Notes (Signed)
Subjective:    Patient ID: Cherrelle Lucena, female    DOB: 11-Jun-1951, 68 y.o.   MRN: [id number]  HPI Here due to constipation  Chronic constipation Had been doing well on the linzess for close to a year 3 days ago--?"something happened"--felt stomach "lock up----soreness" Had to take laxatives (ex-lax) and then suppository (dulcolax) Didn't work Hasn't eaten much --just yogurt Generalized soreness in abdomen Low grade fever over past 2 days  Current Outpatient Medications on File Prior to Visit  Medication Sig Dispense Refill  . albuterol (PROVENTIL HFA;VENTOLIN HFA) 108 (90 Base) MCG/ACT inhaler Inhale 2 puffs into the lungs every 6 (six) hours as needed for wheezing or shortness of breath. 1 Inhaler 1  . aspirin 81 MG chewable tablet Chew 81 mg by mouth every other day.      Marland Kitchen atorvastatin (LIPITOR) 20 MG tablet TAKE 1 TABLET BY MOUTH  DAILY 90 tablet 3  . Cholecalciferol (VITAMIN D3) 5000 UNITS CAPS Take 1 tablet by mouth daily.    . diazepam (VALIUM) 5 MG tablet Take 1 tablet (5 mg total) by mouth 3 (three) times daily as needed. For severe vertigo 30 tablet 0  . levothyroxine (SYNTHROID) 75 MCG tablet TAKE 1 TABLET BY MOUTH  DAILY 90 tablet 3  . linaclotide (LINZESS) 145 MCG CAPS capsule Take 1 capsule (145 mcg total) by mouth daily before breakfast. 30 capsule 11  . lisinopril-hydrochlorothiazide (ZESTORETIC) 10-12.5 MG tablet TAKE ONE-HALF TABLET BY  MOUTH DAILY 45 tablet 3  . meclizine (ANTIVERT) 25 MG tablet Take 1-2 tablets (25-50 mg total) by mouth 3 (three) times daily as needed. For vertigo 60 tablet 0  . Sertaconazole Nitrate 2 % CREA Apply 1 application topically daily. 30 g 0   No current facility-administered medications on file prior to visit.     Allergies  Allergen Reactions  . Codeine     REACTION: Itching    Past Medical History:  Diagnosis Date  . Anemia   . Celiac disease   . Diverticulitis   . GERD (gastroesophageal reflux disease)   .  Hyperlipidemia   . Hypertension   . Sleep disturbance   . Thyroiditis, lymphocytic    surgery 2013  . Vitamin B12 deficiency    unresponsive to oral replacement    Past Surgical History:  Procedure Laterality Date  . COLONOSCOPY    . ESOPHAGOGASTRODUODENOSCOPY  03/04   colon  . Laryngeal implant  2/13   after damage during thyroidectomy  . THYROIDECTOMY  12/12   Lymphocytic thyroiditis with goiter--Dr Rochel Brome    Family History  Problem Relation Age of Onset  . Stroke Father   . Colon cancer Neg Hx     Social History   Socioeconomic History  . Marital status: Married    Spouse name: Not on file  . Number of children: 2  . Years of education: Not on file  . Highest education level: Not on file  Occupational History  . 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 Financial resource strain: Not on file  . Food insecurity    Worry: Not on file    Inability: Not on file  . Transportation needs    Medical: Not on file    Non-medical: Not on file  Tobacco Use  . Smoking status: Never Smoker  . Smokeless tobacco: Never Used  Substance and Sexual Activity  . Alcohol use: Yes    Comment: rare  . Drug use:  No  . Sexual activity: Not on file  Lifestyle  . Physical activity    Days per week: Not on file    Minutes per session: Not on file  . Stress: Not on file  Relationships  . Social Herbalist on phone: Not on file    Gets together: Not on file    Attends religious service: Not on file    Active member of club or organization: Not on file    Attends meetings of clubs or organizations: Not on file    Relationship status: Not on file  . Intimate partner violence    Fear of current or ex partner: Not on file    Emotionally abused: Not on file    Physically abused: Not on file    Forced sexual activity: Not on file  Other Topics Concern  . Not on file  Social History Narrative   The patient is married and has 2 children.   She is  employed in Librarian, academic at [employer]   Rare alcohol and never tobacco   Review of Systems Hasn't changed her diet---avoids gluten No N/V No cough or SOB    Objective:   Physical Exam  Constitutional: She appears well-developed. No distress.  Neck: No thyromegaly present.  Cardiovascular: Normal rate, regular rhythm and normal heart sounds. Exam reveals no gallop.  No murmur heard. Respiratory: Effort normal and breath sounds normal. No respiratory distress. She has no wheezes. She has no rales.  GI: Soft. Bowel sounds are normal. She exhibits no distension and no mass. There is no abdominal tenderness. There is no rebound and no guarding.  Musculoskeletal:        General: No edema.  Lymphadenopathy:    She has no cervical adenopathy.  Psychiatric: She has a normal mood and affect. Her behavior is normal.           Assessment & Plan:

## 2019-06-21 NOTE — Patient Instructions (Signed)
Please double the linzess--- 2 of the 145mg  until they run out. Also start miralax --- 1 capful in a large glass of water or other beverage--- every few hours till your bowels are moving again.

## 2019-10-01 ENCOUNTER — Ambulatory Visit (INDEPENDENT_AMBULATORY_CARE_PROVIDER_SITE_OTHER): Payer: 59 | Admitting: Family Medicine

## 2019-10-01 ENCOUNTER — Encounter: Payer: Self-pay | Admitting: Family Medicine

## 2019-10-01 ENCOUNTER — Other Ambulatory Visit: Payer: Self-pay

## 2019-10-01 DIAGNOSIS — J029 Acute pharyngitis, unspecified: Secondary | ICD-10-CM

## 2019-10-01 MED ORDER — AMOXICILLIN 500 MG PO CAPS: 500.0000 mg | ORAL_CAPSULE | Freq: Three times a day (TID) | ORAL | 0 refills | Status: DC

## 2019-10-01 NOTE — Assessment & Plan Note (Signed)
Sore throat for 3 weeks with swollen feeling uvula  No uri symptoms (except for throat clearing and she denies pnd)   Also no rash or fever  Disc possib of bacterial pharyngitis (like strep) Will tx empirically with amoxicillin 500 mg tid  Update if not starting to improve in a week or if worsening   May need to be seen in resp clinic if not improved Also inst to call if any new symptoms

## 2019-10-01 NOTE — Patient Instructions (Signed)
Take the amoxicillin as directed with food Drink fluids Try salt water gargle Acetaminophen can also help sore throat Watch for new symptoms like fever or headache or cough and call us  Update if not starting to improve in a week or if worsening

## 2019-10-01 NOTE — Progress Notes (Signed)
Virtual Visit via Video Note  I connected with Sharon Shepherd on 10/01/19 at 11:30 AM EST by a video enabled telemedicine application and verified that I am speaking with the correct person using two identifiers.  Location: Patient: home Provider: office    I discussed the limitations of evaluation and management by telemedicine and the availability of in person appointments. The patient expressed understanding and agreed to proceed.  Parties involved in encounter  Patient: Sharon Shepherd   Provider:  Loura Pardon MD    History of Present Illness: Pt presents with ST for 3 weeks   69 yo pt of Dr Silvio Pate   Temp: 98.2 F (36.8 C)(pt reported)  ST is work after she eats  Uvula is swollen and occasionally "mucousy" Top of her throat and roof of mouth are sore   When she looks at her throat it is red [place] looks bigger No lesions  No white patches   Voice is not hoarse  Hurts to swallow  Not a lot of pnd Does clear her throat  No cough from deep in chest  Nose feels dry  (otherwise not congested)  No headache   No rash   No fever or aches or chills   No sick contacts  Not around children    Had both her covid vaccines -did very well   Patient Active Problem List   Diagnosis Date Noted  . Sore throat 10/01/2019  . Chronic constipation 06/21/2019  . Allergic asthma 01/14/2018  . Hyperlipidemia   . Vitamin B12 deficiency   . Hypothyroidism   . Routine general medical examination at a health care facility 01/15/2011  . Sleep disturbance 06/05/2009  . ANEMIA-NOS 10/22/2007  . Essential hypertension, benign 10/22/2007  . DIVERTICULOSIS, COLON 10/22/2007  . Celiac disease 10/22/2007   Past Medical History:  Diagnosis Date  . Anemia   . Celiac disease   . Diverticulitis   . GERD (gastroesophageal reflux disease)   . Hyperlipidemia   . Hypertension   . Sleep disturbance   . Thyroiditis, lymphocytic    surgery 2013  . Vitamin B12 deficiency    unresponsive to oral replacement   Past Surgical History:  Procedure Laterality Date  . COLONOSCOPY    . ESOPHAGOGASTRODUODENOSCOPY  03/04   colon  . Laryngeal implant  2/13   after damage during thyroidectomy  . THYROIDECTOMY  12/12   Lymphocytic thyroiditis with goiter--Dr [place] History   Tobacco Use  . Smoking status: Never Smoker  . Smokeless tobacco: Never Used  Substance Use Topics  . Alcohol use: Yes    Comment: rare  . Drug use: No   Family History  Problem Relation Age of Onset  . Stroke Father   . Colon cancer Neg Hx    Allergies  Allergen Reactions  . Codeine     REACTION: Itching   Current Outpatient Medications on File Prior to Visit  Medication Sig Dispense Refill  . albuterol (PROVENTIL HFA;VENTOLIN HFA) 108 (90 Base) MCG/ACT inhaler Inhale 2 puffs into the lungs every 6 (six) hours as needed for wheezing or shortness of breath. 1 Inhaler 1  . aspirin 81 MG chewable tablet Chew 81 mg by mouth every other day.      Marland Kitchen atorvastatin (LIPITOR) 20 MG tablet TAKE 1 TABLET BY MOUTH  DAILY 90 tablet 3  . Cholecalciferol (VITAMIN D3) 5000 UNITS CAPS Take 1 tablet by mouth daily.    . diazepam (VALIUM) 5 MG tablet Take  1 tablet (5 mg total) by mouth 3 (three) times daily as needed. For severe vertigo 30 tablet 0  . levothyroxine (SYNTHROID) 75 MCG tablet TAKE 1 TABLET BY MOUTH  DAILY 90 tablet 3  . linaclotide (LINZESS) 290 MCG CAPS capsule Take 1 capsule (290 mcg total) by mouth daily before breakfast. 90 capsule 3  . lisinopril-hydrochlorothiazide (ZESTORETIC) 10-12.5 MG tablet TAKE ONE-HALF TABLET BY  MOUTH DAILY 45 tablet 3  . meclizine (ANTIVERT) 25 MG tablet Take 1-2 tablets (25-50 mg total) by mouth 3 (three) times daily as needed. For vertigo 60 tablet 0  . Sertaconazole Nitrate 2 % CREA Apply 1 application topically daily. 30 g 0   No current facility-administered medications on file prior to visit.    Review of Systems  Constitutional:  Negative for chills, fever and malaise/fatigue.  HENT: Positive for sore throat. Negative for congestion, ear pain and sinus pain.   Eyes: Negative for blurred vision, discharge and redness.  Respiratory: Negative for cough, shortness of breath and stridor.   Cardiovascular: Negative for chest pain, palpitations and leg swelling.  Gastrointestinal: Negative for abdominal pain, diarrhea, nausea and vomiting.  Musculoskeletal: Negative for myalgias.  Skin: Negative for rash.  Neurological: Negative for dizziness and headaches.    Observations/Objective: Patient appears well, in no distress Weight is baseline  No facial swelling or asymmetry Normal voice-not hoarse and no slurred speech No obvious tremor or mobility impairment Moving neck and UEs normally Able to hear the call well  No cough or shortness of breath during interview  Talkative and mentally sharp with no cognitive changes No skin changes on face or neck , no rash or pallor Affect is normal    Assessment and Plan: Problem List Items Addressed This Visit      Other   Sore throat    Sore throat for 3 weeks with swollen feeling uvula  No uri symptoms (except for throat clearing and she denies pnd)   Also no rash or fever  Disc possib of bacterial pharyngitis (like strep) Will tx empirically with amoxicillin 500 mg tid  Update if not starting to improve in a week or if worsening   May need to be seen in resp clinic if not improved Also inst to call if any new symptoms          Follow Up Instructions: Take the amoxicillin as directed with food Drink fluids Try salt water gargle Acetaminophen can also help sore throat Watch for new symptoms like fever or headache or cough and call us  Update if not starting to improve in a week or if worsening     I discussed the assessment and treatment plan with the patient. The patient was provided an opportunity to ask questions and all were answered. The patient agreed with  the plan and demonstrated an understanding of the instructions.   The patient was advised to call back or seek an in-person evaluation if the symptoms worsen or if the condition fails to improve as anticipated.     Loura Pardon, MD

## 2019-11-08 MED ORDER — LOSARTAN POTASSIUM-HCTZ 50-12.5 MG PO TABS: 1.0000 | ORAL_TABLET | Freq: Every day | ORAL | 3 refills | Status: DC

## 2019-12-02 ENCOUNTER — Other Ambulatory Visit: Payer: Self-pay

## 2020-01-22 ENCOUNTER — Other Ambulatory Visit: Payer: Self-pay | Admitting: Internal Medicine

## 2020-01-24 ENCOUNTER — Other Ambulatory Visit: Payer: Self-pay | Admitting: Internal Medicine

## 2020-01-24 DIAGNOSIS — Z1231 Encounter for screening mammogram for malignant neoplasm of breast: Secondary | ICD-10-CM

## 2020-01-25 ENCOUNTER — Ambulatory Visit (INDEPENDENT_AMBULATORY_CARE_PROVIDER_SITE_OTHER): Payer: 59 | Admitting: Internal Medicine

## 2020-01-25 ENCOUNTER — Other Ambulatory Visit: Payer: Self-pay

## 2020-01-25 ENCOUNTER — Encounter: Payer: Self-pay | Admitting: Internal Medicine

## 2020-01-25 VITALS — BP 128/84 | HR 74 | Temp 97.6°F | Ht 65.5 in | Wt 201.0 lb

## 2020-01-25 DIAGNOSIS — E2839 Other primary ovarian failure: Secondary | ICD-10-CM

## 2020-01-25 DIAGNOSIS — Z23 Encounter for immunization: Secondary | ICD-10-CM | POA: Diagnosis not present

## 2020-01-25 DIAGNOSIS — E890 Postprocedural hypothyroidism: Secondary | ICD-10-CM

## 2020-01-25 DIAGNOSIS — Z0000 Encounter for general adult medical examination without abnormal findings: Secondary | ICD-10-CM | POA: Diagnosis not present

## 2020-01-25 DIAGNOSIS — K900 Celiac disease: Secondary | ICD-10-CM

## 2020-01-25 DIAGNOSIS — D649 Anemia, unspecified: Secondary | ICD-10-CM

## 2020-01-25 DIAGNOSIS — I10 Essential (primary) hypertension: Secondary | ICD-10-CM

## 2020-01-25 DIAGNOSIS — E538 Deficiency of other specified B group vitamins: Secondary | ICD-10-CM

## 2020-01-25 NOTE — Assessment & Plan Note (Signed)
Will check levels 

## 2020-01-25 NOTE — Assessment & Plan Note (Signed)
BP Readings from Last 3 Encounters:  01/25/20 128/84  06/21/19 104/66  02/22/19 124/86   Good control Will check labs

## 2020-01-25 NOTE — Assessment & Plan Note (Signed)
Mostly avoids gluten Will recheck iron studies

## 2020-01-25 NOTE — Addendum Note (Signed)
Addended by: Ellamae Sia on: 01/25/2020 09:26 AM   Modules accepted: Orders

## 2020-01-25 NOTE — Assessment & Plan Note (Signed)
If TSH still low, consider reducing her levothyroxine

## 2020-01-25 NOTE — Addendum Note (Signed)
Addended by: Virl Cagey on: 01/25/2020 09:23 AM   Modules accepted: Orders

## 2020-01-25 NOTE — Progress Notes (Signed)
Subjective:    Patient ID: Sharon Shepherd, female    DOB: 1951-06-27, 69 y.o.   MRN: [id number]  HPI Here for physical This visit occurred during the SARS-CoV-2 public health emergency.  Safety protocols were in place, including screening questions prior to the visit, additional usage of staff PPE, and extensive cleaning of exam room while observing appropriate contact time as indicated for disinfecting solutions.   Has had some dizziness lately Somewhat different Uses meclizine and it helps Hasn't needed the valium lately  Still working Considering retirement but not ready Still working from home  Current Outpatient Medications on File Prior to Visit  Medication Sig Dispense Refill  . albuterol (PROVENTIL HFA;VENTOLIN HFA) 108 (90 Base) MCG/ACT inhaler Inhale 2 puffs into the lungs every 6 (six) hours as needed for wheezing or shortness of breath. 1 Inhaler 1  . aspirin 81 MG chewable tablet Chew 81 mg by mouth every other day.      Marland Kitchen atorvastatin (LIPITOR) 20 MG tablet TAKE 1 TABLET BY MOUTH  DAILY 90 tablet 0  . Cholecalciferol (VITAMIN D3) 5000 UNITS CAPS Take 1 tablet by mouth daily.    . diazepam (VALIUM) 5 MG tablet Take 1 tablet (5 mg total) by mouth 3 (three) times daily as needed. For severe vertigo 30 tablet 0  . levothyroxine (SYNTHROID) 75 MCG tablet TAKE 1 TABLET BY MOUTH  DAILY 90 tablet 3  . linaclotide (LINZESS) 290 MCG CAPS capsule Take 1 capsule (290 mcg total) by mouth daily before breakfast. 90 capsule 3  . losartan-hydrochlorothiazide (HYZAAR) 50-12.5 MG tablet Take 1 tablet by mouth daily. 90 tablet 3  . meclizine (ANTIVERT) 25 MG tablet Take 1-2 tablets (25-50 mg total) by mouth 3 (three) times daily as needed. For vertigo 60 tablet 0  . Sertaconazole Nitrate 2 % CREA Apply 1 application topically daily. 30 g 0   No current facility-administered medications on file prior to visit.    Allergies  Allergen Reactions  . Codeine     REACTION: Itching     Past Medical History:  Diagnosis Date  . Anemia   . Celiac disease   . Diverticulitis   . GERD (gastroesophageal reflux disease)   . Hyperlipidemia   . Hypertension   . Sleep disturbance   . Thyroiditis, lymphocytic    surgery 2013  . Vitamin B12 deficiency    unresponsive to oral replacement    Past Surgical History:  Procedure Laterality Date  . COLONOSCOPY    . ESOPHAGOGASTRODUODENOSCOPY  03/04   colon  . Laryngeal implant  2/13   after damage during thyroidectomy  . THYROIDECTOMY  12/12   Lymphocytic thyroiditis with goiter--Dr Rochel Brome    Family History  Problem Relation Age of Onset  . Stroke Father   . Colon cancer Neg Hx     Social History   Socioeconomic History  . Marital status: Married    Spouse name: Not on file  . Number of children: 2  . Years of education: Not on file  . Highest education level: Not on file  Occupational History  . 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 Smoking status: Never Smoker  . Smokeless tobacco: Never Used  Substance and Sexual Activity  . Alcohol use: Yes    Comment: rare  . Drug use: No  . Sexual activity: Not on file  Other Topics Concern  . Not on file  Social History Narrative   The patient  is married and has 2 children.   She is employed in Librarian, academic at [employer]   Rare alcohol and never tobacco   Social Determinants of Radio broadcast assistant Strain:   . Difficulty of Paying Living Expenses:   Food Insecurity:   . Worried About Charity fundraiser in the Last Year:   . Arboriculturist in the Last Year:   Transportation Needs:   . Film/video editor (Medical):   Marland Kitchen Lack of Transportation (Non-Medical):   Physical Activity:   . Days of Exercise per Week:   . Minutes of Exercise per Session:   Stress:   . Feeling of Stress :   Social Connections:   . Frequency of Communication with Friends and Family:   . Frequency of Social Gatherings with Friends and  Family:   . Attends Religious Services:   . Active Member of Clubs or Organizations:   . Attends Archivist Meetings:   Marland Kitchen Marital Status:   Intimate Partner Violence:   . Fear of Current or Ex-Partner:   . Emotionally Abused:   Marland Kitchen Physically Abused:   . Sexually Abused:    Review of Systems  Constitutional: Negative for fatigue and unexpected weight change.       Is walking some Wears seat belt  HENT: Negative for dental problem, hearing loss and tinnitus.        Keeps up with dentist  Eyes: Negative for visual disturbance.       No diplopia or unilateral vision loss  Respiratory: Negative for cough, chest tightness and shortness of breath.   Cardiovascular: Positive for palpitations. Negative for leg swelling.       Upper left chest "twinges" at times. Not exertional. Will lasts seconds to 1 minute Caffeine may prompt palpitations  Gastrointestinal:       No heartburn Mostly avoids gluten  Endocrine: Negative for polydipsia and polyuria.  Genitourinary: Negative for dyspareunia, dysuria and hematuria.  Musculoskeletal: Negative for arthralgias and joint swelling.       Mild back pain---nothing serious  Skin: Negative for rash.       No suspicious skin lesions  Allergic/Immunologic: Positive for environmental allergies. Negative for immunocompromised state.       Satisfied with OTC Rx  Neurological: Positive for dizziness. Negative for syncope.       Occ mild headaches---advil with help  Hematological: Negative for adenopathy. Does not bruise/bleed easily.  Psychiatric/Behavioral: Negative for dysphoric mood. The patient is not nervous/anxious.        Never slept well--- no serious daytime somnolence       Objective:   Physical Exam  Constitutional: She is oriented to person, place, and time. No distress.  HENT:  Head: Normocephalic and atraumatic.  Right Ear: Tympanic membrane and ear canal normal.  Left Ear: Tympanic membrane and ear canal normal.   Mouth/Throat: Mucous membranes are moist.  Eyes: Pupils are equal, round, and reactive to light. Conjunctivae are normal.  Cardiovascular: Normal rate, regular rhythm and normal pulses. Exam reveals no gallop.  No murmur heard. Respiratory: Effort normal. She has no wheezes. She has no rales.  GI: Soft. Normal appearance. There is no abdominal tenderness.  Musculoskeletal:     Cervical back: Neck supple.     Right lower leg: No edema.     Left lower leg: No edema.  Lymphadenopathy:    She has no cervical adenopathy.  Neurological: She is alert and oriented to person, place, and  time.  Skin: Skin is warm. No rash noted.  Psychiatric: Her behavior is normal. Mood normal.           Assessment & Plan:

## 2020-01-25 NOTE — Assessment & Plan Note (Signed)
Healthy Discussed adding resistance training Colon due 2025 Will get last pap at gyn soon Mammogram next week Will give shingrix Flu vaccine in the fall

## 2020-01-25 NOTE — Assessment & Plan Note (Signed)
?  from the celiac Did have workup

## 2020-01-26 LAB — COMPREHENSIVE METABOLIC PANEL
ALT: 20 IU/L (ref 0–32)
AST: 29 IU/L (ref 0–40)
Albumin/Globulin Ratio: 2 (ref 1.2–2.2)
Albumin: 4.5 g/dL (ref 3.8–4.8)
Alkaline Phosphatase: 100 IU/L (ref 48–121)
BUN/Creatinine Ratio: 18 (ref 12–28)
BUN: 13 mg/dL (ref 8–27)
Bilirubin Total: 0.7 mg/dL (ref 0.0–1.2)
CO2: 23 mmol/L (ref 20–29)
Calcium: 9.5 mg/dL (ref 8.7–10.3)
Chloride: 105 mmol/L (ref 96–106)
Creatinine, Ser: 0.71 mg/dL (ref 0.57–1.00)
GFR calc Af Amer: 103 mL/min/{1.73_m2} (ref >59)
GFR calc non Af Amer: 89 mL/min/{1.73_m2} (ref >59)
Globulin, Total: 2.2 g/dL (ref 1.5–4.5)
Glucose: 88 mg/dL (ref 65–99)
Potassium: 4.1 mmol/L (ref 3.5–5.2)
Sodium: 141 mmol/L (ref 134–144)
Total Protein: 6.7 g/dL (ref 6.0–8.5)

## 2020-01-26 LAB — IRON AND TIBC
Iron Saturation: 6 % — CL (ref 15–55)
Iron: 27 ug/dL (ref 27–139)
Total Iron Binding Capacity: 467 ug/dL — ABNORMAL HIGH (ref 250–450)
UIBC: 440 ug/dL — ABNORMAL HIGH (ref 118–369)

## 2020-01-26 LAB — TRANSFERRIN: Transferrin: 383 mg/dL — ABNORMAL HIGH (ref 192–364)

## 2020-01-26 LAB — LIPID PANEL
Chol/HDL Ratio: 2.6 ratio (ref 0.0–4.4)
Cholesterol, Total: 142 mg/dL (ref 100–199)
HDL: 55 mg/dL (ref >39)
LDL Chol Calc (NIH): 73 mg/dL (ref 0–99)
Triglycerides: 68 mg/dL (ref 0–149)
VLDL Cholesterol Cal: 14 mg/dL (ref 5–40)

## 2020-01-26 LAB — SARS-COV-2 ANTIBODIES: SARS-CoV-2 Antibodies: NEGATIVE

## 2020-01-26 LAB — TSH: TSH: 0.921 u[IU]/mL (ref 0.450–4.500)

## 2020-01-26 LAB — CBC
Hematocrit: 36.3 % (ref 34.0–46.6)
Hemoglobin: 11 g/dL — ABNORMAL LOW (ref 11.1–15.9)
MCH: 21.9 pg — ABNORMAL LOW (ref 26.6–33.0)
MCHC: 30.3 g/dL — ABNORMAL LOW (ref 31.5–35.7)
MCV: 72 fL — ABNORMAL LOW (ref 79–97)
Platelets: 187 10*3/uL (ref 150–450)
RBC: 5.03 x10E6/uL (ref 3.77–5.28)
RDW: 17.7 % — ABNORMAL HIGH (ref 11.7–15.4)
WBC: 3.9 10*3/uL (ref 3.4–10.8)

## 2020-01-26 LAB — VITAMIN B12: Vitamin B-12: 116 pg/mL — ABNORMAL LOW (ref 232–1245)

## 2020-01-26 LAB — FERRITIN: Ferritin: 9 ng/mL — ABNORMAL LOW (ref 15–150)

## 2020-01-26 LAB — T4, FREE: Free T4: 1.37 ng/dL (ref 0.82–1.77)

## 2020-01-27 NOTE — Telephone Encounter (Signed)
Please set her up for B12 injections

## 2020-01-27 NOTE — Telephone Encounter (Signed)
Sharon Shepherd, Is it possible to add on this additional test at this point?

## 2020-01-27 NOTE — Telephone Encounter (Signed)
Sharon Shepherd, Can you have your staff set her up for follow up. Still with persistent iron deficiency and not sure if further testing is appropriate (?repeat colon). She is not totally compliant with gluten restrictions

## 2020-01-28 ENCOUNTER — Ambulatory Visit
Admission: RE | Admit: 2020-01-28 | Discharge: 2020-01-28 | Disposition: A | Discharge status: Home / Self Care | Payer: 59 | Source: Ambulatory Visit | Attending: Internal Medicine | Admitting: Internal Medicine

## 2020-01-28 DIAGNOSIS — Z1231 Encounter for screening mammogram for malignant neoplasm of breast: Secondary | ICD-10-CM

## 2020-01-28 LAB — SPECIMEN STATUS REPORT

## 2020-01-28 LAB — SAR COV2 SEROLOGY (COVID19)AB(IGG),IA: DiaSorin SARS-CoV-2 Ab, IgG: POSITIVE

## 2020-02-12 ENCOUNTER — Other Ambulatory Visit: Payer: Self-pay | Admitting: Internal Medicine

## 2020-04-11 ENCOUNTER — Other Ambulatory Visit: Payer: Self-pay | Admitting: Internal Medicine

## 2020-04-25 ENCOUNTER — Ambulatory Visit (INDEPENDENT_AMBULATORY_CARE_PROVIDER_SITE_OTHER): Payer: 59

## 2020-04-25 ENCOUNTER — Telehealth: Payer: Self-pay | Admitting: Internal Medicine

## 2020-04-25 DIAGNOSIS — Z23 Encounter for immunization: Secondary | ICD-10-CM

## 2020-04-25 NOTE — Telephone Encounter (Signed)
Patient came in office and dropped off form for BMI appeal. Patient is wanting this filled and a call when done. Placed in tower.

## 2020-05-01 NOTE — Telephone Encounter (Signed)
I left a message on patient's voice mail to return my call.

## 2020-05-01 NOTE — Telephone Encounter (Signed)
Please offer appointment tomorrow at noon--can be virtual if she prefers

## 2020-05-02 ENCOUNTER — Other Ambulatory Visit: Payer: Self-pay

## 2020-05-02 ENCOUNTER — Ambulatory Visit: Payer: 59 | Admitting: Internal Medicine

## 2020-05-02 ENCOUNTER — Encounter: Payer: Self-pay | Admitting: Internal Medicine

## 2020-05-02 DIAGNOSIS — B029 Zoster without complications: Secondary | ICD-10-CM | POA: Diagnosis not present

## 2020-05-02 MED ORDER — VALACYCLOVIR HCL 1 G PO TABS: 1000.0000 mg | ORAL_TABLET | Freq: Three times a day (TID) | ORAL | 0 refills | Status: DC

## 2020-05-02 NOTE — Assessment & Plan Note (Signed)
Mild and minimal pain Some pain down right leg now--not clear if related  Will treat with valacyclovir for a week Discussed contact isolation

## 2020-05-02 NOTE — Telephone Encounter (Signed)
I spoke to patient and she'll come in to the office today for visit at 12:00.

## 2020-05-02 NOTE — Progress Notes (Signed)
Subjective:    Patient ID: Sharon Shepherd, female    DOB: [birth year], 69 y.o.   MRN: [id number]  HPI Here due to rash concerning for shingles This visit occurred during the SARS-CoV-2 public health emergency.  Safety protocols were in place, including screening questions prior to the visit, additional usage of staff PPE, and extensive cleaning of exam room while observing appropriate contact time as indicated for disinfecting solutions.   Had rash that started on right side of buttocks 3 days ago Some itching Not really painful  Tried neosporin and wiped with lysol  Current Outpatient Medications on File Prior to Visit  Medication Sig Dispense Refill  . albuterol (PROVENTIL HFA;VENTOLIN HFA) 108 (90 Base) MCG/ACT inhaler Inhale 2 puffs into the lungs every 6 (six) hours as needed for wheezing or shortness of breath. 1 Inhaler 1  . aspirin 81 MG chewable tablet Chew 81 mg by mouth every other day.      Marland Kitchen atorvastatin (LIPITOR) 20 MG tablet TAKE 1 TABLET BY MOUTH  DAILY 90 tablet 3  . Cholecalciferol (VITAMIN D3) 5000 UNITS CAPS Take 1 tablet by mouth daily.    . diazepam (VALIUM) 5 MG tablet Take 1 tablet (5 mg total) by mouth 3 (three) times daily as needed. For severe vertigo 30 tablet 0  . levothyroxine (SYNTHROID) 75 MCG tablet TAKE 1 TABLET BY MOUTH  DAILY 90 tablet 3  . linaclotide (LINZESS) 290 MCG CAPS capsule Take 1 capsule (290 mcg total) by mouth daily before breakfast. 90 capsule 3  . losartan-hydrochlorothiazide (HYZAAR) 50-12.5 MG tablet Take 1 tablet by mouth daily. 90 tablet 3  . meclizine (ANTIVERT) 25 MG tablet Take 1-2 tablets (25-50 mg total) by mouth 3 (three) times daily as needed. For vertigo 60 tablet 0  . Sertaconazole Nitrate 2 % CREA Apply 1 application topically daily. 30 g 0   No current facility-administered medications on file prior to visit.    Allergies  Allergen Reactions  . Codeine     REACTION: Itching    Past Medical History:  Diagnosis  Date  . Anemia   . Celiac disease   . Diverticulitis   . GERD (gastroesophageal reflux disease)   . Hyperlipidemia   . Hypertension   . Sleep disturbance   . Thyroiditis, lymphocytic    surgery 2013  . Vitamin B12 deficiency    unresponsive to oral replacement    Past Surgical History:  Procedure Laterality Date  . COLONOSCOPY    . ESOPHAGOGASTRODUODENOSCOPY  03/04   colon  . Laryngeal implant  2/13   after damage during thyroidectomy  . THYROIDECTOMY  12/12   Lymphocytic thyroiditis with goiter--Dr Rochel Brome    Family History  Problem Relation Age of Onset  . Stroke Father   . Colon cancer Neg Hx   . Breast cancer Neg Hx     Social History   Socioeconomic History  . Marital status: Married    Spouse name: Not on file  . Number of children: 2  . Years of education: Not on file  . Highest education level: Not on file  Occupational History  . 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 Smoking status: Never Smoker  . Smokeless tobacco: Never Used  Substance and Sexual Activity  . Alcohol use: Yes    Comment: rare  . Drug use: No  . Sexual activity: Not on file  Other Topics Concern  . Not on file  Social  History Narrative   The patient is married and has 2 children.   She is employed in Librarian, academic at [employer]   Rare alcohol and never tobacco   Social Determinants of Radio broadcast assistant Strain:   . Difficulty of Paying Living Expenses: Not on file  Food Insecurity:   . Worried About Charity fundraiser in the Last Year: Not on file  . Ran Out of Food in the Last Year: Not on file  Transportation Needs:   . Lack of Transportation (Medical): Not on file  . Lack of Transportation (Non-Medical): Not on file  Physical Activity:   . Days of Exercise per Week: Not on file  . Minutes of Exercise per Session: Not on file  Stress:   . Feeling of Stress : Not on file  Social Connections:   . Frequency of Communication with  Friends and Family: Not on file  . Frequency of Social Gatherings with Friends and Family: Not on file  . Attends Religious Services: Not on file  . Active Member of Clubs or Organizations: Not on file  . Attends Archivist Meetings: Not on file  . Marital Status: Not on file  Intimate Partner Violence:   . Fear of Current or Ex-Partner: Not on file  . Emotionally Abused: Not on file  . Physically Abused: Not on file  . Sexually Abused: Not on file   Review of Systems No fever No N/V    Objective:   Physical Exam Skin:    Comments: Classic clump of vesicles on red bases on inner mid right buttock            Assessment & Plan:

## 2020-05-08 MED ORDER — SILVER SULFADIAZINE 1 % EX CREA: 1.0000 "application " | TOPICAL_CREAM | Freq: Every day | CUTANEOUS | 0 refills | Status: DC

## 2020-05-08 MED ORDER — VALACYCLOVIR HCL 1 G PO TABS
1000.0000 mg | ORAL_TABLET | Freq: Three times a day (TID) | ORAL | 0 refills | Status: DC
Start: 2020-05-08 — End: 2020-10-02

## 2020-06-07 ENCOUNTER — Ambulatory Visit (INDEPENDENT_AMBULATORY_CARE_PROVIDER_SITE_OTHER): Payer: 59

## 2020-06-07 ENCOUNTER — Other Ambulatory Visit: Payer: Self-pay

## 2020-06-07 DIAGNOSIS — Z23 Encounter for immunization: Secondary | ICD-10-CM

## 2020-06-15 ENCOUNTER — Other Ambulatory Visit: Payer: Self-pay | Admitting: Internal Medicine

## 2020-09-06 ENCOUNTER — Other Ambulatory Visit: Payer: Self-pay | Admitting: Internal Medicine

## 2020-09-25 ENCOUNTER — Emergency Department: Payer: 59

## 2020-09-25 ENCOUNTER — Emergency Department
Admission: EM | Admit: 2020-09-25 | Discharge: 2020-09-25 | Disposition: A | Discharge status: Home / Self Care | Payer: 59 | Attending: Emergency Medicine | Admitting: Emergency Medicine

## 2020-09-25 ENCOUNTER — Other Ambulatory Visit: Payer: Self-pay

## 2020-09-25 ENCOUNTER — Telehealth: Payer: Self-pay | Admitting: Internal Medicine

## 2020-09-25 DIAGNOSIS — Y92002 Bathroom of unspecified non-institutional (private) residence single-family (private) house as the place of occurrence of the external cause: Secondary | ICD-10-CM | POA: Diagnosis not present

## 2020-09-25 DIAGNOSIS — I10 Essential (primary) hypertension: Secondary | ICD-10-CM | POA: Diagnosis not present

## 2020-09-25 DIAGNOSIS — Z79899 Other long term (current) drug therapy: Secondary | ICD-10-CM | POA: Insufficient documentation

## 2020-09-25 DIAGNOSIS — W1812XA Fall from or off toilet with subsequent striking against object, initial encounter: Secondary | ICD-10-CM | POA: Diagnosis not present

## 2020-09-25 DIAGNOSIS — R55 Syncope and collapse: Secondary | ICD-10-CM | POA: Insufficient documentation

## 2020-09-25 DIAGNOSIS — Z23 Encounter for immunization: Secondary | ICD-10-CM | POA: Diagnosis not present

## 2020-09-25 DIAGNOSIS — U071 COVID-19: Secondary | ICD-10-CM | POA: Insufficient documentation

## 2020-09-25 DIAGNOSIS — W19XXXA Unspecified fall, initial encounter: Secondary | ICD-10-CM

## 2020-09-25 DIAGNOSIS — S0181XA Laceration without foreign body of other part of head, initial encounter: Secondary | ICD-10-CM | POA: Insufficient documentation

## 2020-09-25 DIAGNOSIS — Z7982 Long term (current) use of aspirin: Secondary | ICD-10-CM | POA: Insufficient documentation

## 2020-09-25 HISTORY — DX: COVID-19: U07.1

## 2020-09-25 LAB — COMPREHENSIVE METABOLIC PANEL
ALT: 17 U/L (ref 0–44)
AST: 25 U/L (ref 15–41)
Albumin: 3.9 g/dL (ref 3.5–5.0)
Alkaline Phosphatase: 78 U/L (ref 38–126)
Anion gap: 11 (ref 5–15)
BUN: 12 mg/dL (ref 8–23)
CO2: 23 mmol/L (ref 22–32)
Calcium: 8.4 mg/dL — ABNORMAL LOW (ref 8.9–10.3)
Chloride: 104 mmol/L (ref 98–111)
Creatinine, Ser: 0.84 mg/dL (ref 0.44–1.00)
GFR, Estimated: >60 mL/min (ref >60)
Glucose, Bld: 118 mg/dL — ABNORMAL HIGH (ref 70–99)
Potassium: 3.6 mmol/L (ref 3.5–5.1)
Sodium: 138 mmol/L (ref 135–145)
Total Bilirubin: 0.8 mg/dL (ref 0.3–1.2)
Total Protein: 7 g/dL (ref 6.5–8.1)

## 2020-09-25 LAB — CBC WITH DIFFERENTIAL/PLATELET
Abs Immature Granulocytes: 0.01 10*3/uL (ref 0.00–0.07)
Basophils Absolute: 0 10*3/uL (ref 0.0–0.1)
Basophils Relative: 1 %
Eosinophils Absolute: 0 10*3/uL (ref 0.0–0.5)
Eosinophils Relative: 1 %
HCT: 32.9 % — ABNORMAL LOW (ref 36.0–46.0)
Hemoglobin: 10 g/dL — ABNORMAL LOW (ref 12.0–15.0)
Immature Granulocytes: 0 %
Lymphocytes Relative: 13 %
Lymphs Abs: 0.6 10*3/uL — ABNORMAL LOW (ref 0.7–4.0)
MCH: 21 pg — ABNORMAL LOW (ref 26.0–34.0)
MCHC: 30.4 g/dL (ref 30.0–36.0)
MCV: 69.1 fL — ABNORMAL LOW (ref 80.0–100.0)
Monocytes Absolute: 0.7 10*3/uL (ref 0.1–1.0)
Monocytes Relative: 14 %
Neutro Abs: 3.7 10*3/uL (ref 1.7–7.7)
Neutrophils Relative %: 71 %
Platelets: 147 10*3/uL — ABNORMAL LOW (ref 150–400)
RBC: 4.76 MIL/uL (ref 3.87–5.11)
RDW: 18.4 % — ABNORMAL HIGH (ref 11.5–15.5)
WBC: 5.1 10*3/uL (ref 4.0–10.5)
nRBC: 0 % (ref 0.0–0.2)

## 2020-09-25 LAB — RESP PANEL BY RT-PCR (FLU A&B, COVID) ARPGX2
Influenza A by PCR: NEGATIVE
Influenza B by PCR: NEGATIVE
SARS Coronavirus 2 by RT PCR: POSITIVE — AB

## 2020-09-25 LAB — TROPONIN I (HIGH SENSITIVITY): Troponin I (High Sensitivity): <2 ng/L (ref <18)

## 2020-09-25 MED ORDER — TETANUS-DIPHTH-ACELL PERTUSSIS 5-2.5-18.5 LF-MCG/0.5 IM SUSY
0.5000 mL | PREFILLED_SYRINGE | Freq: Once | INTRAMUSCULAR | Status: AC
  Administered 2020-09-25: 0.5 mL via INTRAMUSCULAR
  Filled 2020-09-25: qty 0.5

## 2020-09-25 MED ORDER — CEPHALEXIN 500 MG PO CAPS: 500.0000 mg | ORAL_CAPSULE | Freq: Two times a day (BID) | ORAL | 0 refills | Status: AC

## 2020-09-25 MED ORDER — CEPHALEXIN 500 MG PO CAPS: 500.0000 mg | ORAL_CAPSULE | Freq: Two times a day (BID) | ORAL | 0 refills | Status: DC

## 2020-09-25 MED ORDER — SODIUM CHLORIDE 0.9 % IV BOLUS
1000.0000 mL | Freq: Once | INTRAVENOUS | Status: AC
  Administered 2020-09-25: 1000 mL via INTRAVENOUS

## 2020-09-25 NOTE — Telephone Encounter (Signed)
Pt called in wanted to know about getting a referral for the antibody infusion due to she tested positive for covid ,

## 2020-09-25 NOTE — ED Notes (Signed)
Pt transported to CT at this time.

## 2020-09-25 NOTE — ED Notes (Signed)
Date and time results received: 09/25/20 0722  Test: COVID Critical Value: Postive  Name of Provider Notified: Dr. Jari Pigg, MD

## 2020-09-25 NOTE — Telephone Encounter (Signed)
Spoke to pt. Advised her to let us know if she needed anything.

## 2020-09-25 NOTE — Telephone Encounter (Signed)
Unless she is really sick (and if she got sent out of the ER--she isn't)----she won't qualify since there are very few doses available of the monoclonal antibody treatment. There are new oral treatments that may be available--but I think there is not enough evidence yet to jump into using these.

## 2020-09-25 NOTE — ED Notes (Signed)
Informed that infusion people will call her.  She is able to be up without assist and feels okay.  Cleaned face and chest of blood some, but she will wash hair x1 at home and then do not get wet until wound is healed more in a few days.

## 2020-09-25 NOTE — ED Provider Notes (Signed)
[facility] Emergency Department Provider Note  ____________________________________________   Event Date/Time   First MD Initiated Contact with Patient 09/25/20 [phone]     (approximate)  I have reviewed the triage vital signs and the nursing notes.   HISTORY  Chief Complaint Loss of Consciousness    HPI Sharon Shepherd is a 70 y.o. female with anemia, hypertension, hyperlipidemia who comes in for syncopal episode.  Patient had syncopal episode while having a bowel movement.  Patient did hit her head and there was a lot of blood on the ground.  Large laceration noted to the forehead.  Patient had another witnessed syncopal episode with EMS.  Blood pressure was 73Z systolic.  Patient's husband was positive for COVID and patient stated that she was not feeling very well had an upset stomach.  She member feeling very lightheaded and weak prior to passing out.  She had no chest pain, no shortness of breath.  No abdominal pain any longer.  Syncopal episodes happened intermittently, twice, brought on by dehydration, better after fluids with EMS. Pt had just had a bowel movement prior to episode of syncope.           Past Medical History:  Diagnosis Date  . Anemia   . Celiac disease   . Diverticulitis   . GERD (gastroesophageal reflux disease)   . Hyperlipidemia   . Hypertension   . Sleep disturbance   . Thyroiditis, lymphocytic    surgery 2013  . Vitamin B12 deficiency    unresponsive to oral replacement    Patient Active Problem List   Diagnosis Date Noted  . Shingles 05/02/2020  . Chronic constipation 06/21/2019  . Allergic asthma 01/14/2018  . Hyperlipidemia   . Vitamin B12 deficiency   . Hypothyroidism   . Routine general medical examination at a health care facility 01/15/2011  . Sleep disturbance 06/05/2009  . ANEMIA-NOS 10/22/2007  . Essential hypertension, benign 10/22/2007  . DIVERTICULOSIS, COLON 10/22/2007  . Celiac disease  10/22/2007    Past Surgical History:  Procedure Laterality Date  . COLONOSCOPY    . ESOPHAGOGASTRODUODENOSCOPY  03/04   colon  . Laryngeal implant  2/13   after damage during thyroidectomy  . THYROIDECTOMY  12/12   Lymphocytic thyroiditis with goiter--Dr Rochel Brome    Prior to Admission medications   Medication Sig Start Date End Date Taking? Authorizing Provider  albuterol (PROVENTIL HFA;VENTOLIN HFA) 108 (90 Base) MCG/ACT inhaler Inhale 2 puffs into the lungs every 6 (six) hours as needed for wheezing or shortness of breath. 10/20/18   Lesleigh Noe, MD  aspirin 81 MG chewable tablet Chew 81 mg by mouth every other day.      [provider]  atorvastatin (LIPITOR) 20 MG tablet TAKE 1 TABLET BY MOUTH  DAILY 04/12/20   Viviana Simpler I, MD  Cholecalciferol (VITAMIN D3) 5000 UNITS CAPS Take 1 tablet by mouth daily.    [provider]  diazepam (VALIUM) 5 MG tablet Take 1 tablet (5 mg total) by mouth 3 (three) times daily as needed. For severe vertigo 12/25/18   [name], MD  levothyroxine (SYNTHROID) 75 MCG tablet TAKE 1 TABLET BY MOUTH  DAILY 02/15/20   [name], MD  LINZESS 290 MCG CAPS capsule TAKE 1 CAPSULE(290 MCG) BY MOUTH DAILY BEFORE BREAKFAST 06/15/20   [name], MD  losartan-hydrochlorothiazide (HYZAAR) 50-12.5 MG tablet TAKE 1 TABLET BY MOUTH  DAILY 09/07/20   [name], MD  meclizine (ANTIVERT) 25 MG tablet Take 1-2 tablets (25-50 mg total) by mouth 3 (three) times daily as needed. For vertigo 01/20/19   [name], MD  Sertaconazole Nitrate 2 % CREA Apply 1 application topically daily. 02/22/19   Jearld Fenton, NP  silver sulfADIAZINE (SILVADENE) 1 % cream Apply 1 application topically daily. 05/08/20   [name], MD  valACYclovir (VALTREX) 1000 MG tablet Take 1 tablet (1,000 mg total) by mouth 3 (three) times daily. 05/08/20   [name], MD    Allergies Codeine  Family History  Problem Relation Age  of Onset  . Stroke Father   . Colon cancer Neg Hx   . Breast cancer Neg Hx     Social History Social History   Tobacco Use  . Smoking status: Never Smoker  . Smokeless tobacco: Never Used  Substance Use Topics  . Alcohol use: Yes    Comment: rare  . Drug use: No      Review of Systems Constitutional: No fever/chills, syncope Eyes: No visual changes. ENT: No sore throat.  Head laceration Cardiovascular: Denies chest pain. Respiratory: Denies shortness of breath. Gastrointestinal: No abdominal pain.  No nausea, no vomiting.  No diarrhea.  No constipation. Genitourinary: Negative for dysuria. Musculoskeletal: Negative for back pain. Skin: Negative for rash. Neurological: Negative for headaches, focal weakness or numbness. All other ROS negative ____________________________________________   PHYSICAL EXAM:  VITAL SIGNS: ED Triage Vitals  Enc Vitals Group     BP 09/25/20 0547 135/71     Pulse Rate 09/25/20 0547 83     Resp 09/25/20 0547 (!) 23     Temp 09/25/20 0547 97.8 F (36.6 C)     Temp Source 09/25/20 0547 Oral     SpO2 09/25/20 0547 99 %     Weight 09/25/20 0544 201 lb 15.1 oz (91.6 kg)     Height 09/25/20 0544 5' 5.5" (1.664 m)     Head Circumference --      Peak Flow --      Pain Score 09/25/20 0543 1     Pain Loc --      Pain Edu? --      Excl. in [place]? --     Constitutional: Alert and oriented. Well appearing and in no acute distress. Eyes: Conjunctivae are normal. EOMI. Head: 2 inch laceration on the forehead Nose: No congestion/rhinnorhea. Mouth/Throat: Mucous membranes are moist.   Neck: No stridor. Trachea Midline. FROM no c spine tenderness Cardiovascular: Normal rate, regular rhythm. Grossly normal heart sounds.  Good peripheral circulation. No chest wall tenderness Respiratory: Normal respiratory effort.  No retractions. Lungs CTAB. Gastrointestinal: Soft and nontender. No distention. No abdominal bruits.  Musculoskeletal: No lower  extremity tenderness nor edema.  No joint effusions. No other extremity pain Neurologic:  Normal speech and language. No gross focal neurologic deficits are appreciated.  Skin:  Skin is warm, dry and intact. No rash noted. Psychiatric: Mood and affect are normal. Speech and behavior are normal. GU: Deferred   ____________________________________________   LABS (all labs ordered are listed, but only abnormal results are displayed)  Labs Reviewed  RESP PANEL BY RT-PCR (FLU A&B, COVID) ARPGX2 - Abnormal; Notable for the following components:      Result Value   SARS Coronavirus 2 by RT PCR POSITIVE (*)    All other components within normal limits  CBC WITH DIFFERENTIAL/PLATELET - Abnormal; Notable for the following components:   Hemoglobin 10.0 (*)    HCT  32.9 (*)    MCV 69.1 (*)    MCH 21.0 (*)    RDW 18.4 (*)    Platelets 147 (*)    Lymphs Abs 0.6 (*)    All other components within normal limits  COMPREHENSIVE METABOLIC PANEL - Abnormal; Notable for the following components:   Glucose, Bld 118 (*)    Calcium 8.4 (*)    All other components within normal limits  TROPONIN I (HIGH SENSITIVITY)   ____________________________________________   ED ECG REPORT I, Vanessa [place], the attending physician, personally viewed and interpreted this ECG.  Normal sinus rate of 80, no ST elevation, no T wave inversions, normal intervals ____________________________________________  RADIOLOGY Robert Bellow, personally viewed and evaluated these images (plain radiographs) as part of my medical decision making, as well as reviewing the written report by the radiologist. ED MD interpretation:  No ICH on cT  Official radiology report(s): CT Head Wo Contrast  Result Date: 09/25/2020 CLINICAL DATA:  Syncope and fall EXAM: CT HEAD WITHOUT CONTRAST CT CERVICAL SPINE WITHOUT CONTRAST TECHNIQUE: Multidetector CT imaging of the head and cervical spine was performed following the standard protocol  without intravenous contrast. Multiplanar CT image reconstructions of the cervical spine were also generated. COMPARISON:  None. FINDINGS: CT HEAD FINDINGS Brain: No evidence of acute infarction, hemorrhage, hydrocephalus, extra-axial collection or mass lesion/mass effect. Vascular: No hyperdense vessel or unexpected calcification. Skull: Normal. Negative for fracture or focal lesion. Right scalp wound with soft tissue gas. Sinuses/Orbits: No evidence of injury. Atelectatic and opacified left maxillary sinus, partially covered. CT CERVICAL SPINE FINDINGS Alignment: Normal. Skull base and vertebrae: No acute fracture. No primary bone lesion or focal pathologic process. Soft tissues and spinal canal: No prevertebral fluid or swelling. No visible canal hematoma. Implant at the right vocal fold. Reportedly there was a prior recurrent laryngeal nerve injury at thyroidectomy. Disc levels: C3-4 and C4-5 disc degeneration with uncovertebral spurs encroaching on the foramina. Upper chest: No evidence of injury. IMPRESSION: 1. No evidence of acute intracranial or cervical spine injury. 2. Right scalp wound without calvarial fracture. 3. Chronic left maxillary sinusitis. Electronically Signed   By: Monte Fantasia M.D.   On: 09/25/2020 07:44   CT Cervical Spine Wo Contrast  Result Date: 09/25/2020 CLINICAL DATA:  Syncope and fall EXAM: CT HEAD WITHOUT CONTRAST CT CERVICAL SPINE WITHOUT CONTRAST TECHNIQUE: Multidetector CT imaging of the head and cervical spine was performed following the standard protocol without intravenous contrast. Multiplanar CT image reconstructions of the cervical spine were also generated. COMPARISON:  None. FINDINGS: CT HEAD FINDINGS Brain: No evidence of acute infarction, hemorrhage, hydrocephalus, extra-axial collection or mass lesion/mass effect. Vascular: No hyperdense vessel or unexpected calcification. Skull: Normal. Negative for fracture or focal lesion. Right scalp wound with soft tissue  gas. Sinuses/Orbits: No evidence of injury. Atelectatic and opacified left maxillary sinus, partially covered. CT CERVICAL SPINE FINDINGS Alignment: Normal. Skull base and vertebrae: No acute fracture. No primary bone lesion or focal pathologic process. Soft tissues and spinal canal: No prevertebral fluid or swelling. No visible canal hematoma. Implant at the right vocal fold. Reportedly there was a prior recurrent laryngeal nerve injury at thyroidectomy. Disc levels: C3-4 and C4-5 disc degeneration with uncovertebral spurs encroaching on the foramina. Upper chest: No evidence of injury. IMPRESSION: 1. No evidence of acute intracranial or cervical spine injury. 2. Right scalp wound without calvarial fracture. 3. Chronic left maxillary sinusitis. Electronically Signed   By: Monte Fantasia M.D.   On:  09/25/2020 07:44    ____________________________________________   PROCEDURES  Procedure(s) performed (including Critical Care):  Marland KitchenMarland KitchenLaceration Repair  Date/Time: 09/25/2020 7:55 AM Performed by: Vanessa [place], MD Authorized by: Vanessa Webb, MD   Consent:    Consent obtained:  Verbal   Consent given by:  Patient   Risks, benefits, and alternatives were discussed: yes     Risks discussed:  Infection, nerve damage, need for additional repair, poor cosmetic result, poor wound healing, pain, retained foreign body, tendon damage and vascular damage   Alternatives discussed:  No treatment Anesthesia:    Anesthesia method:  Local infiltration   Local anesthetic:  Lidocaine 1% WITH epi Laceration details:    Location:  Scalp   Scalp location:  Frontal   Length (cm):  7   Depth (mm):  5 Exploration:    Limited defect created (wound extended): no     Wound exploration: entire depth of wound visualized     Contaminated: no   Treatment:    Area cleansed with:  Chlorhexidine   Amount of cleaning:  Standard   Irrigation solution:  Sterile water   Debridement:  None   Undermining:  None   Scar  revision: no     Layers/structures repaired:  Muscle fascia Muscle fascia:    Suture size:  4-0   Suture material:  Monocryl   Suture technique:  Simple interrupted   Number of sutures:  3 Skin repair:    Repair method:  Sutures   Suture size:  5-0   Suture material:  Prolene   Number of sutures:  10 Approximation:    Approximation:  Close Repair type:    Repair type:  Simple Post-procedure details:    Dressing:  Open (no dressing)   Procedure completion:  Tolerated well, no immediate complications     ____________________________________________   INITIAL IMPRESSION / ASSESSMENT AND PLAN / ED COURSE  Sharon Shepherd was evaluated in Emergency Department on 09/25/2020 for the symptoms described in the history of present illness. She was evaluated in the context of the global COVID-19 pandemic, which necessitated consideration that the patient might be at risk for infection with the SARS-CoV-2 virus that causes COVID-19. Institutional protocols and algorithms that pertain to the evaluation of patients at risk for COVID-19 are in a state of rapid change based on information released by regulatory bodies including the CDC and federal and state organizations. These policies and algorithms were followed during the patient's care in the ED.    Patient is a 70 year old who comes in with syncopal episode with low blood pressure.  Suspect this is more likely related to dehydration secondary to feeling unwell.  She felt lightheaded and dizzy prior could be vasovagal given happened after bowel movement.  At this time I have lower suspicion for cardiac cause.  EKG and troponin were reassuring.  She denies any shortness of breath to suggest PE, abdominal pain to suggest AAA rupture.  Will get Covid swab. Check hemoglobin due to blood loss and CT head to rule out ICH  Covid swab.  CT head no signs of ICH or fracture  Laceration repaired. Given went down to bone tried to approx the deeper  later --will put on keflex and update tetanus.   D/w pt admission for cardiac monitor vs going home. Pt prefers to go home and I do not think this was cardiac in nature given history and physical. Will walk pt and make sure not dizzy.   Pt ambulates  well. Covid + .  Discussed quarantine.  No sob no hypoxia. Pt is vaccinated. Pt feels comfortable with dc home.   Pt to f/u with wound check by Friday may need suture in longer till following Monday depending on wound check given extent of wound.        ____________________________________________   FINAL CLINICAL IMPRESSION(S) / ED DIAGNOSES   Final diagnoses:  Fall, initial encounter  Syncope and collapse  COVID-19  Laceration of forehead, initial encounter      MEDICATIONS GIVEN DURING THIS VISIT:  Medications  sodium chloride 0.9 % bolus 1,000 mL (has no administration in time range)     ED Discharge Orders         Ordered    cephALEXin (KEFLEX) 500 MG capsule  2 times daily,   Status:  Discontinued        09/25/20 0753    cephALEXin (KEFLEX) 500 MG capsule  2 times daily        09/25/20 0813    Ambulatory referral for Covid Treatment        09/25/20 8502           Note:  This document was prepared using Dragon voice recognition software and may include unintentional dictation errors.   Vanessa [place], MD 09/25/20 [phone]

## 2020-09-25 NOTE — ED Notes (Signed)
EDP @ bedside 

## 2020-09-25 NOTE — ED Triage Notes (Addendum)
Patient from home via [facility] with c/o syncopal episode while attempting BM with unknown down time.  EMS reports SBP: 82 on scene decreased to 77. Patient's husband tested positive for Covid Friday, patient tested neg yesterday.  Patient states she does not remember passing out or falling, "I just remember waking up after hitting my head". A&Ox4, able to speak in full sentences.

## 2020-09-25 NOTE — Discharge Instructions (Addendum)
Take the antibiotics to prevent infection.  Your Covid positive and need to quarantine appropriately.  You should have your wound checked on Friday to see if sutures are ready to come out but they may not be able to be taken out to the following Monday depending on how it is healing.  Hold your aspirin  Return to the ER for worsening shortness of breath or any other concerns   1. No evidence of acute intracranial or cervical spine injury.  2. Right scalp wound without calvarial fracture.  3. Chronic left maxillary sinusitis.

## 2020-09-26 ENCOUNTER — Other Ambulatory Visit (HOSPITAL_BASED_OUTPATIENT_CLINIC_OR_DEPARTMENT_OTHER): Payer: Self-pay | Admitting: Nurse Practitioner

## 2020-09-26 ENCOUNTER — Ambulatory Visit (HOSPITAL_COMMUNITY)
Admission: RE | Admit: 2020-09-26 | Discharge: 2020-09-26 | Disposition: A | Discharge status: Home / Self Care | Payer: 59 | Source: Ambulatory Visit | Attending: Pulmonary Disease | Admitting: Pulmonary Disease

## 2020-09-26 ENCOUNTER — Telehealth (HOSPITAL_BASED_OUTPATIENT_CLINIC_OR_DEPARTMENT_OTHER): Payer: Self-pay | Admitting: Nurse Practitioner

## 2020-09-26 DIAGNOSIS — E785 Hyperlipidemia, unspecified: Secondary | ICD-10-CM

## 2020-09-26 DIAGNOSIS — J4520 Mild intermittent asthma, uncomplicated: Secondary | ICD-10-CM

## 2020-09-26 DIAGNOSIS — K900 Celiac disease: Secondary | ICD-10-CM

## 2020-09-26 DIAGNOSIS — U071 COVID-19: Secondary | ICD-10-CM

## 2020-09-26 DIAGNOSIS — D649 Anemia, unspecified: Secondary | ICD-10-CM

## 2020-09-26 DIAGNOSIS — Z6833 Body mass index (BMI) 33.0-33.9, adult: Secondary | ICD-10-CM

## 2020-09-26 DIAGNOSIS — I10 Essential (primary) hypertension: Secondary | ICD-10-CM

## 2020-09-26 DIAGNOSIS — E890 Postprocedural hypothyroidism: Secondary | ICD-10-CM

## 2020-09-26 MED ORDER — DIPHENHYDRAMINE HCL 50 MG/ML IJ SOLN: 50.0000 mg | Freq: Once | INTRAMUSCULAR | Status: DC | PRN

## 2020-09-26 MED ORDER — EPINEPHRINE 0.3 MG/0.3ML IJ SOAJ: 0.3000 mg | Freq: Once | INTRAMUSCULAR | Status: DC | PRN

## 2020-09-26 MED ORDER — METHYLPREDNISOLONE SODIUM SUCC 125 MG IJ SOLR: 125.0000 mg | Freq: Once | INTRAMUSCULAR | Status: DC | PRN

## 2020-09-26 MED ORDER — ALBUTEROL SULFATE HFA 108 (90 BASE) MCG/ACT IN AERS: 2.0000 | INHALATION_SPRAY | Freq: Once | RESPIRATORY_TRACT | Status: DC | PRN

## 2020-09-26 MED ORDER — FAMOTIDINE IN NACL 20-0.9 MG/50ML-% IV SOLN: 20.0000 mg | Freq: Once | INTRAVENOUS | Status: DC | PRN

## 2020-09-26 MED ORDER — SOTROVIMAB 500 MG/8ML IV SOLN
500.0000 mg | Freq: Once | INTRAVENOUS | Status: AC
  Administered 2020-09-26: 500 mg via INTRAVENOUS

## 2020-09-26 MED ORDER — SODIUM CHLORIDE 0.9 % IV SOLN: INTRAVENOUS | Status: DC | PRN

## 2020-09-26 NOTE — Progress Notes (Signed)
I connected by phone with Sharon Shepherd on 09/26/2020 at 10:15 AM to discuss the potential use of a new treatment for mild to moderate COVID-19 viral infection in non-hospitalized patients.  This patient is a 70 y.o. female that meets the FDA criteria for Emergency Use Authorization of COVID monoclonal antibody sotrovimab.  Has a (+) direct SARS-CoV-2 viral test result  Has mild or moderate COVID-19   Is NOT hospitalized due to COVID-19  Is within 10 days of symptom onset  Has at least one of the high risk factor(s) for progression to severe COVID-19 and/or hospitalization as defined in EUA.  Specific high risk criteria : Older age (>/= 68 yo), BMI > 25, Immunosuppressive Disease or Treatment, Cardiovascular disease or hypertension and Chronic Lung Disease   I have spoken and communicated the following to the patient or parent/caregiver regarding COVID monoclonal antibody treatment:  1. FDA has authorized the emergency use for the treatment of mild to moderate COVID-19 in adults and pediatric patients with positive results of direct SARS-CoV-2 viral testing who are 45 years of age and older weighing at least 40 kg, and who are at high risk for progressing to severe COVID-19 and/or hospitalization.  2. The significant known and potential risks and benefits of COVID monoclonal antibody, and the extent to which such potential risks and benefits are unknown.  3. Information on available alternative treatments and the risks and benefits of those alternatives, including clinical trials.  4. Patients treated with COVID monoclonal antibody should continue to self-isolate and use infection control measures (e.g., wear mask, isolate, social distance, avoid sharing personal items, clean and disinfect "high touch" surfaces, and frequent handwashing) according to CDC guidelines.   5. The patient or parent/caregiver has the option to accept or refuse COVID monoclonal antibody treatment.  After  reviewing this information with the patient, the patient has agreed to receive one of the available covid 19 monoclonal antibodies and will be provided an appropriate fact sheet prior to infusion. Orma Render, NP 09/26/2020 10:15 AM

## 2020-09-26 NOTE — Telephone Encounter (Signed)
I connected by phone with Sharon Shepherd on 09/26/2020 at 10:09 AM to discuss the potential use of a new treatment for mild to moderate COVID-19 viral infection in non-hospitalized patients.  This patient is a 70 y.o. female that meets the FDA criteria for Emergency Use Authorization of COVID monoclonal antibody sotrovimab.  Has a (+) direct SARS-CoV-2 viral test result  Has mild or moderate COVID-19   Is NOT hospitalized due to COVID-19  Is within 10 days of symptom onset  Has at least one of the high risk factor(s) for progression to severe COVID-19 and/or hospitalization as defined in EUA.  Specific high risk criteria : Older age (>/= 68 yo), BMI > 25, Immunosuppressive Disease or Treatment, Cardiovascular disease or hypertension and Chronic Lung Disease   I have spoken and communicated the following to the patient or parent/caregiver regarding COVID monoclonal antibody treatment:  1. FDA has authorized the emergency use for the treatment of mild to moderate COVID-19 in adults and pediatric patients with positive results of direct SARS-CoV-2 viral testing who are 47 years of age and older weighing at least 40 kg, and who are at high risk for progressing to severe COVID-19 and/or hospitalization.  2. The significant known and potential risks and benefits of COVID monoclonal antibody, and the extent to which such potential risks and benefits are unknown.  3. Information on available alternative treatments and the risks and benefits of those alternatives, including clinical trials.  4. Patients treated with COVID monoclonal antibody should continue to self-isolate and use infection control measures (e.g., wear mask, isolate, social distance, avoid sharing personal items, clean and disinfect "high touch" surfaces, and frequent handwashing) according to CDC guidelines.   5. The patient or parent/caregiver has the option to accept or refuse COVID monoclonal antibody treatment.  After  reviewing this information with the patient, the patient has agreed to receive one of the available covid 19 monoclonal antibodies and will be provided an appropriate fact sheet prior to infusion. Orma Render, NP 09/26/2020 10:09 AM

## 2020-09-26 NOTE — Progress Notes (Signed)
Diagnosis: COVID-19  Physician: Dr. Patrick Wright  Procedure: Covid Infusion Clinic Med: Sotrovimab infusion - Provided patient with sotrovimab fact sheet for patients, parents, and caregivers prior to infusion.   Complications: No immediate complications noted  Discharge: Discharged home    

## 2020-09-26 NOTE — Discharge Instructions (Signed)

## 2020-09-26 NOTE — Progress Notes (Signed)
Patient reviewed Fact Sheet for Patients, Parents, and Caregivers for Emergency Use Authorization (EUA) of sotrovimab for the Treatment of Coronavirus. Patient also reviewed and is agreeable to the estimated cost of treatment. Patient is agreeable to proceed.   

## 2020-09-29 ENCOUNTER — Telehealth: Payer: 59 | Admitting: Internal Medicine

## 2020-10-02 ENCOUNTER — Ambulatory Visit (INDEPENDENT_AMBULATORY_CARE_PROVIDER_SITE_OTHER): Payer: 59 | Admitting: Internal Medicine

## 2020-10-02 ENCOUNTER — Encounter: Payer: Self-pay | Admitting: Internal Medicine

## 2020-10-02 VITALS — BP 122/78 | HR 89 | Temp 97.5°F | Ht 65.5 in | Wt 197.0 lb

## 2020-10-02 DIAGNOSIS — D509 Iron deficiency anemia, unspecified: Secondary | ICD-10-CM | POA: Diagnosis not present

## 2020-10-02 DIAGNOSIS — E538 Deficiency of other specified B group vitamins: Secondary | ICD-10-CM

## 2020-10-02 DIAGNOSIS — S0181XD Laceration without foreign body of other part of head, subsequent encounter: Secondary | ICD-10-CM | POA: Diagnosis not present

## 2020-10-02 DIAGNOSIS — R55 Syncope and collapse: Secondary | ICD-10-CM

## 2020-10-02 DIAGNOSIS — S0181XA Laceration without foreign body of other part of head, initial encounter: Secondary | ICD-10-CM | POA: Insufficient documentation

## 2020-10-02 MED ORDER — CYANOCOBALAMIN 1000 MCG/ML IJ SOLN
1000.0000 ug | Freq: Once | INTRAMUSCULAR | Status: AC
  Administered 2020-10-02: 1000 ug via INTRAMUSCULAR

## 2020-10-02 MED ORDER — CYANOCOBALAMIN 1000 MCG/ML IJ SOLN: 1000.0000 ug | Freq: Once | INTRAMUSCULAR | 11 refills | Status: AC

## 2020-10-02 MED ORDER — "SYRINGE/NEEDLE (DISP) 25G X 1-1/2"" 1 ML MISC": 1.0000 | 11 refills | Status: DC

## 2020-10-02 MED ORDER — MECLIZINE HCL 25 MG PO TABS: 25.0000 mg | ORAL_TABLET | Freq: Three times a day (TID) | ORAL | 0 refills | Status: DC | PRN

## 2020-10-02 NOTE — Assessment & Plan Note (Signed)
She didn't go to GI due to COVID last year Still anemic with low MCV Will set up with Dr Carlean Purl

## 2020-10-02 NOTE — Progress Notes (Signed)
Subjective:    Patient ID: Unknown Flannigan, female    DOB: [birth year], 70 y.o.   MRN: [id number]  HPI Here for ER follow up This visit occurred during the SARS-CoV-2 public health emergency.  Safety protocols were in place, including screening questions prior to the visit, additional usage of staff PPE, and extensive cleaning of exam room while observing appropriate contact time as indicated for disinfecting solutions.   Passed out on the toilet At that point--only had a stomach ache Tried to lie her head down but woke up on the floor Husband got her up--passed out again He called 911--- and she passed out again  To ER---labs reviewed Still anemic and she hadn't been to the GI yet (like I thought last year) Not taking the B12 shots--and didn't tolerate the oral meds  COVID positive Now feeling better from that (mostly GI--only mild cough)  Current Outpatient Medications on File Prior to Visit  Medication Sig Dispense Refill  . albuterol (PROVENTIL HFA;VENTOLIN HFA) 108 (90 Base) MCG/ACT inhaler Inhale 2 puffs into the lungs every 6 (six) hours as needed for wheezing or shortness of breath. 1 Inhaler 1  . aspirin 81 MG chewable tablet Chew 81 mg by mouth every other day.    Marland Kitchen atorvastatin (LIPITOR) 20 MG tablet TAKE 1 TABLET BY MOUTH  DAILY 90 tablet 3  . cetirizine (ZYRTEC) 10 MG tablet Take 10 mg by mouth daily.    . Cholecalciferol (VITAMIN D3) 5000 UNITS CAPS Take 1 tablet by mouth daily.    . diazepam (VALIUM) 5 MG tablet Take 1 tablet (5 mg total) by mouth 3 (three) times daily as needed. For severe vertigo 30 tablet 0  . levothyroxine (SYNTHROID) 75 MCG tablet TAKE 1 TABLET BY MOUTH  DAILY 90 tablet 3  . LINZESS 290 MCG CAPS capsule TAKE 1 CAPSULE(290 MCG) BY MOUTH DAILY BEFORE BREAKFAST 90 capsule 3  . losartan-hydrochlorothiazide (HYZAAR) 50-12.5 MG tablet TAKE 1 TABLET BY MOUTH  DAILY 90 tablet 1  . meclizine (ANTIVERT) 25 MG tablet Take 1-2 tablets (25-50 mg total) by  mouth 3 (three) times daily as needed. For vertigo 60 tablet 0   No current facility-administered medications on file prior to visit.    Allergies  Allergen Reactions  . Codeine     REACTION: Itching    Past Medical History:  Diagnosis Date  . Anemia   . Celiac disease   . Diverticulitis   . GERD (gastroesophageal reflux disease)   . Hyperlipidemia   . Hypertension   . Sleep disturbance   . Thyroiditis, lymphocytic    surgery 2013  . Vitamin B12 deficiency    unresponsive to oral replacement    Past Surgical History:  Procedure Laterality Date  . COLONOSCOPY    . ESOPHAGOGASTRODUODENOSCOPY  03/04   colon  . Laryngeal implant  2/13   after damage during thyroidectomy  . THYROIDECTOMY  12/12   Lymphocytic thyroiditis with goiter--Dr Rochel Brome    Family History  Problem Relation Age of Onset  . Stroke Father   . Colon cancer Neg Hx   . Breast cancer Neg Hx     Social History   Socioeconomic History  . Marital status: Married    Spouse name: Not on file  . Number of children: 2  . Years of education: Not on file  . Highest education level: Not on file  Occupational History  . 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  Smoking status: Never Smoker  . Smokeless tobacco: Never Used  Substance and Sexual Activity  . Alcohol use: Yes    Comment: rare  . Drug use: No  . Sexual activity: Not on file  Other Topics Concern  . Not on file  Social History Narrative   The patient is married and has 2 children.   She is employed in Librarian, academic at [employer]   Rare alcohol and never tobacco   Social Determinants of Radio broadcast assistant Strain: Not on file  Food Insecurity: Not on file  Transportation Needs: Not on file  Physical Activity: Not on file  Stress: Not on file  Social Connections: Not on file  Intimate Partner Violence: Not on file   Review of Systems  Some headache since head got it No diplopia or unilateral  vision loss    Objective:   Physical Exam Constitutional:      Appearance: Normal appearance.  Skin:    Comments: Forehead laceration is well healed No inflammation  Neurological:     Mental Status: She is alert.            Assessment & Plan:

## 2020-10-02 NOTE — Assessment & Plan Note (Signed)
Healed well Sutures x 10 removed without incident Discussed home care

## 2020-10-02 NOTE — Assessment & Plan Note (Signed)
Seems like fairly classic vasaovagal spells She is concerned with strong FH of heart disease Will set up cardiology evaluation

## 2020-10-02 NOTE — Assessment & Plan Note (Signed)
Will start injections First here--then Rx

## 2020-10-02 NOTE — Addendum Note (Signed)
Addended by: Pilar Grammes on: 10/02/2020 03:30 PM   Modules accepted: Orders

## 2020-10-05 ENCOUNTER — Other Ambulatory Visit: Payer: Self-pay

## 2020-10-05 MED ORDER — VALSARTAN-HYDROCHLOROTHIAZIDE 160-12.5 MG PO TABS: 1.0000 | ORAL_TABLET | Freq: Every day | ORAL | 3 refills | Status: DC

## 2020-10-13 ENCOUNTER — Other Ambulatory Visit: Payer: Self-pay

## 2020-10-13 ENCOUNTER — Ambulatory Visit (INDEPENDENT_AMBULATORY_CARE_PROVIDER_SITE_OTHER): Payer: 59

## 2020-10-13 ENCOUNTER — Ambulatory Visit: Payer: 59 | Admitting: Cardiology

## 2020-10-13 ENCOUNTER — Encounter: Payer: Self-pay | Admitting: Cardiology

## 2020-10-13 VITALS — BP 139/87 | HR 93 | Ht 66.0 in | Wt 205.0 lb

## 2020-10-13 DIAGNOSIS — I10 Essential (primary) hypertension: Secondary | ICD-10-CM | POA: Diagnosis not present

## 2020-10-13 DIAGNOSIS — R55 Syncope and collapse: Secondary | ICD-10-CM

## 2020-10-13 DIAGNOSIS — E7800 Pure hypercholesterolemia, unspecified: Secondary | ICD-10-CM | POA: Diagnosis not present

## 2020-10-13 NOTE — Progress Notes (Signed)
Cardiology Office Note:    Date:  10/13/2020   ID:  Sharon, Shepherd [birth year], MRN [id number]  PCP:  [name], MD   [place]  Cardiologist:  Kate Sable, MD  Advanced Practice Provider:  No care team member to display Electrophysiologist:  None       Referring MD: [name], MD   Chief Complaint  Patient presents with  . New Patient (Initial Visit)    Referred by PCP for Syncope. Meds reviewed verbally with patient.    Sharon Shepherd is a 70 y.o. female who is being seen today for the evaluation of syncope at the request of [name], MD.   History of Present Illness:    Sharon Shepherd is a 70 y.o. female with a hx of hypertension, hyperlipidemia who presents due to syncope.  Patient had symptoms of sinus headache 3 weeks ago.  She was sitting on the toilet when she suddenly felt lightheaded and passed out falling and hitting her head.  She woke up moments later noticing herself on the floor.  Alerted her husband who helped her up.  Few moments later had another episode of passing out.  EMS was called and patient taken to the ED.  In the ED, patient was diagnosed with COVID-19, quarantined herself the next couple of days.  Denies any further symptoms since.  Denies any prior symptoms.  Denies any personal history of heart disease, chest pain, shortness of breath, palpitations.  Her sister has a history of WPW.  Father had MI in his 30s, mother MI in her 45s.  Past Medical History:  Diagnosis Date  . Anemia   . Celiac disease   . Diverticulitis   . GERD (gastroesophageal reflux disease)   . Hyperlipidemia   . Hypertension   . Sleep disturbance   . Thyroiditis, lymphocytic    surgery 2013  . Vitamin B12 deficiency    unresponsive to oral replacement    Past Surgical History:  Procedure Laterality Date  . COLONOSCOPY    . ESOPHAGOGASTRODUODENOSCOPY  03/04   colon  . Laryngeal implant  2/13    after damage during thyroidectomy  . THYROIDECTOMY  12/12   Lymphocytic thyroiditis with goiter--Dr Rochel Brome    Current Medications: Current Meds  Medication Sig  . albuterol (PROVENTIL HFA;VENTOLIN HFA) 108 (90 Base) MCG/ACT inhaler Inhale 2 puffs into the lungs every 6 (six) hours as needed for wheezing or shortness of breath.  Marland Kitchen aspirin 81 MG chewable tablet Chew 81 mg by mouth every other day.  Marland Kitchen atorvastatin (LIPITOR) 20 MG tablet TAKE 1 TABLET BY MOUTH  DAILY  . cetirizine (ZYRTEC) 10 MG tablet Take 10 mg by mouth daily.  . Cholecalciferol (VITAMIN D3) 5000 UNITS CAPS Take 1 tablet by mouth daily.  . cyanocobalamin (,VITAMIN B-12,) 1000 MCG/ML injection Inject 1,000 mcg into the muscle daily.  . diazepam (VALIUM) 5 MG tablet Take 1 tablet (5 mg total) by mouth 3 (three) times daily as needed. For severe vertigo  . levothyroxine (SYNTHROID) 75 MCG tablet TAKE 1 TABLET BY MOUTH  DAILY  . LINZESS 290 MCG CAPS capsule TAKE 1 CAPSULE(290 MCG) BY MOUTH DAILY BEFORE BREAKFAST  . meclizine (ANTIVERT) 25 MG tablet Take 1-2 tablets (25-50 mg total) by mouth 3 (three) times daily as needed. For vertigo  . Syringe/Needle, Disp, 25G X 1-1/2" 1 ML MISC 1 each by Does not apply route every 30 (thirty) days.  Marland Kitchen  valsartan-hydrochlorothiazide (DIOVAN HCT) 160-12.5 MG tablet Take 1 tablet by mouth daily.     Allergies:   Codeine   Social History   Socioeconomic History  . Marital status: Married    Spouse name: Not on file  . Number of children: 2  . Years of education: Not on file  . Highest education level: Not on file  Occupational History  . 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 Smoking status: Never Smoker  . Smokeless tobacco: Never Used  Substance and Sexual Activity  . Alcohol use: Yes    Comment: rare  . Drug use: No  . Sexual activity: Not on file  Other Topics Concern  . Not on file  Social History Narrative   The patient is married and has 2 children.    She is employed in Librarian, academic at [employer]   Rare alcohol and never tobacco   Social Determinants of Radio broadcast assistant Strain: Not on file  Food Insecurity: Not on file  Transportation Needs: Not on file  Physical Activity: Not on file  Stress: Not on file  Social Connections: Not on file     Family History: The patient's family history includes Stroke in her father. There is no history of Colon cancer or Breast cancer.  ROS:   Please see the history of present illness.     All other systems reviewed and are negative.  EKGs/Labs/Other Studies Reviewed:    The following studies were reviewed today:   EKG:  EKG is  ordered today.  The ekg ordered today demonstrates sinus rhythm  Recent Labs: 01/25/2020: TSH 0.921 09/25/2020: ALT 17; BUN 12; Creatinine, Ser 0.84; Hemoglobin 10.0; Platelets 147; Potassium 3.6; Sodium 138  Recent Lipid Panel    Component Value Date/Time   CHOL 142 01/25/2020 0845   TRIG 68 01/25/2020 0845   HDL 55 01/25/2020 0845   CHOLHDL 2.6 01/25/2020 0845   LDLCALC 73 01/25/2020 0845     Risk Assessment/Calculations:      Physical Exam:    VS:  BP 139/87 (BP Location: Left Arm, Patient Position: Sitting, Cuff Size: Normal)   Pulse 93   Ht 5' 6"  (1.676 m)   Wt 205 lb (93 kg)   BMI 33.09 kg/m     Wt Readings from Last 3 Encounters:  10/13/20 205 lb (93 kg)  10/02/20 197 lb (89.4 kg)  09/25/20 201 lb 15.1 oz (91.6 kg)     GEN:  Well nourished, well developed in no acute distress HEENT: Normal NECK: No JVD; No carotid bruits LYMPHATICS: No lymphadenopathy CARDIAC: RRR, no murmurs, rubs, gallops RESPIRATORY:  Clear to auscultation without rales, wheezing or rhonchi  ABDOMEN: Soft, non-tender, non-distended MUSCULOSKELETAL:  No edema; No deformity  SKIN: Warm and dry NEUROLOGIC:  Alert and oriented x 3 PSYCHIATRIC:  Normal affect   ASSESSMENT:    1. Syncope, unspecified syncope type   2. Primary hypertension    3. Pure hypercholesterolemia    PLAN:    In order of problems listed above:  1. Patient with episode of syncope, likely vasovagal in etiology.  Orthostatic vitals today with no evidence for orthostasis.  Will place a cardiac monitor to evaluate any significant arrhythmias, get echocardiogram to evaluate any significant structural abnormalities. 2. Hypertension, BP ok, continue current BP meds. 3. Hyperlipidemia, cholesterol controlled, continue statins.  Follow-up after cardiac monitor and echocardiogram.     Medication Adjustments/Labs and Tests Ordered: Current medicines are reviewed  at length with the patient today.  Concerns regarding medicines are outlined above.  Orders Placed This Encounter  Procedures  . LONG TERM MONITOR (3-14 DAYS)  . EKG 12-Lead  . ECHOCARDIOGRAM COMPLETE   No orders of the defined types were placed in this encounter.   Patient Instructions  Medication Instructions:  Your physician recommends that you continue on your current medications as directed. Please refer to the Current Medication list given to you today.  *If you need a refill on your cardiac medications before your next appointment, please call your pharmacy*   Lab Work: None ordered If you have labs (blood work) drawn today and your tests are completely normal, you will receive your results only by: Marland Kitchen MyChart Message (if you have MyChart) OR . A paper copy in the mail If you have any lab test that is abnormal or we need to change your treatment, we will call you to review the results.   Testing/Procedures:  1.  Your physician has requested that you have an echocardiogram. Echocardiography is a painless test that uses sound waves to create images of your heart. It provides your doctor with information about the size and shape of your heart and how well your heart's chambers and valves are working. This procedure takes approximately one hour. There are no restrictions for this  procedure.  2.  Your physician has recommended that you wear a Zio monitor 2 weeks. This monitor is a medical device that records the heart's electrical activity. Doctors most often use these monitors to diagnose arrhythmias. Arrhythmias are problems with the speed or rhythm of the heartbeat. The monitor is a small device applied to your chest. You can wear one while you do your normal daily activities. While wearing this monitor if you have any symptoms to push the button and record what you felt. Once you have worn this monitor for the period of time provider prescribed (Usually 14 days), you will return the monitor device in the postage paid box. Once it is returned they will download the data collected and provide [place] with a report which the provider will then review and we will call you with those results. Important tips:  1. Avoid showering during the first 24 hours of wearing the monitor. 2. Avoid excessive sweating to help maximize wear time. 3. Do not submerge the device, no hot tubs, and no swimming pools. 4. Keep any lotions or oils away from the patch. 5. After 24 hours you may shower with the patch on. Take brief showers with your back facing the shower head.  6. Do not remove patch once it has been placed because that will interrupt data and decrease adhesive wear time. 7. Push the button when you have any symptoms and write down what you were feeling. 8. Once you have completed wearing your monitor, remove and place into box which has postage paid and place in your outgoing mailbox.  9. If for some reason you have misplaced your box then call our office and we can provide another box and/or mail it off for you.         Follow-Up: At [facility], you and your health needs are our priority.  As part of our continuing mission to provide you with exceptional heart care, we have created designated Provider Care Teams.  These Care Teams include your primary Cardiologist (physician)  and Advanced Practice Providers (APPs -  Physician Assistants and Nurse Practitioners) who all work together to provide you with  the care you need, when you need it.  We recommend signing up for the patient portal called "MyChart".  Sign up information is provided on this After Visit Summary.  MyChart is used to connect with patients for Virtual Visits (Telemedicine).  Patients are able to view lab/test results, encounter notes, upcoming appointments, etc.  Non-urgent messages can be sent to your provider as well.   To learn more about what you can do with MyChart, go to [email or web address].    Your next appointment:   6 weeks   The format for your next appointment:   In Person  Provider:   Kate Sable, MD   Other Instructions      Signed, Kate Sable, MD  10/13/2020 4:43 PM    [place]

## 2020-10-13 NOTE — Patient Instructions (Signed)
Medication Instructions:  Your physician recommends that you continue on your current medications as directed. Please refer to the Current Medication list given to you today.  *If you need a refill on your cardiac medications before your next appointment, please call your pharmacy*   Lab Work: None ordered If you have labs (blood work) drawn today and your tests are completely normal, you will receive your results only by:  [place] (if you have MyChart) OR  A paper copy in the mail If you have any lab test that is abnormal or we need to change your treatment, we will call you to review the results.   Testing/Procedures:  1.  Your physician has requested that you have an echocardiogram. Echocardiography is a painless test that uses sound waves to create images of your heart. It provides your doctor with information about the size and shape of your heart and how well your hearts chambers and valves are working. This procedure takes approximately one hour. There are no restrictions for this procedure.  2.  Your physician has recommended that you wear a Zio monitor 2 weeks. This monitor is a medical device that records the hearts electrical activity. Doctors most often use these monitors to diagnose arrhythmias. Arrhythmias are problems with the speed or rhythm of the heartbeat. The monitor is a small device applied to your chest. You can wear one while you do your normal daily activities. While wearing this monitor if you have any symptoms to push the button and record what you felt. Once you have worn this monitor for the period of time provider prescribed (Usually 14 days), you will return the monitor device in the postage paid box. Once it is returned they will download the data collected and provide [place] with a report which the provider will then review and we will call you with those results. Important tips:  1. Avoid showering during the first 24 hours of wearing the  monitor. 2. Avoid excessive sweating to help maximize wear time. 3. Do not submerge the device, no hot tubs, and no swimming pools. 4. Keep any lotions or oils away from the patch. 5. After 24 hours you may shower with the patch on. Take brief showers with your back facing the shower head.  6. Do not remove patch once it has been placed because that will interrupt data and decrease adhesive wear time. 7. Push the button when you have any symptoms and write down what you were feeling. 8. Once you have completed wearing your monitor, remove and place into box which has postage paid and place in your outgoing mailbox.  9. If for some reason you have misplaced your box then call our office and we can provide another box and/or mail it off for you.         Follow-Up: At [facility], you and your health needs are our priority.  As part of our continuing mission to provide you with exceptional heart care, we have created designated Provider Care Teams.  These Care Teams include your primary Cardiologist (physician) and Advanced Practice Providers (APPs -  Physician Assistants and Nurse Practitioners) who all work together to provide you with the care you need, when you need it.  We recommend signing up for the patient portal called "MyChart".  Sign up information is provided on this After Visit Summary.  MyChart is used to connect with patients for Virtual Visits (Telemedicine).  Patients are able to view lab/test results, encounter notes, upcoming appointments,  etc.  Non-urgent messages can be sent to your provider as well.   To learn more about what you can do with MyChart, go to [email or web address].    Your next appointment:   6 weeks   The format for your next appointment:   In Person  Provider:   Kate Sable, MD   Other Instructions

## 2020-11-03 ENCOUNTER — Ambulatory Visit (INDEPENDENT_AMBULATORY_CARE_PROVIDER_SITE_OTHER): Payer: 59

## 2020-11-03 ENCOUNTER — Other Ambulatory Visit: Payer: Self-pay

## 2020-11-03 DIAGNOSIS — R55 Syncope and collapse: Secondary | ICD-10-CM

## 2020-11-03 LAB — ECHOCARDIOGRAM COMPLETE
AR max vel: 2.17 cm2
AV Area VTI: 2.34 cm2
AV Area mean vel: 2.37 cm2
AV Mean grad: 5 mmHg
AV Peak grad: 8.8 mmHg
Ao pk vel: 1.48 m/s
Area-P 1/2: 4.1 cm2

## 2020-11-03 MED ORDER — PERFLUTREN LIPID MICROSPHERE
1.0000 mL | INTRAVENOUS | Status: AC | PRN
  Administered 2020-11-03: 2 mL via INTRAVENOUS

## 2020-11-14 ENCOUNTER — Other Ambulatory Visit: Payer: Self-pay | Admitting: Internal Medicine

## 2020-11-15 ENCOUNTER — Telehealth: Payer: Self-pay

## 2020-11-15 NOTE — Telephone Encounter (Signed)
Called patient and [facility] requesting call back to give the following result note.  Overall benign cardiac monitor, no significant arrhythmias to suggest etiology of syncope.

## 2020-11-17 NOTE — Telephone Encounter (Signed)
Sharon Shepherd, [name] called on 11/16/20. Closing encounter.

## 2020-11-22 ENCOUNTER — Other Ambulatory Visit: Payer: Self-pay | Admitting: Internal Medicine

## 2020-11-22 ENCOUNTER — Ambulatory Visit: Payer: 59 | Admitting: Internal Medicine

## 2020-11-22 ENCOUNTER — Encounter: Payer: Self-pay | Admitting: Internal Medicine

## 2020-11-22 VITALS — BP 120/60 | HR 78 | Ht 66.0 in | Wt 202.0 lb

## 2020-11-22 DIAGNOSIS — D508 Other iron deficiency anemias: Secondary | ICD-10-CM | POA: Diagnosis not present

## 2020-11-22 DIAGNOSIS — E538 Deficiency of other specified B group vitamins: Secondary | ICD-10-CM

## 2020-11-22 DIAGNOSIS — K900 Celiac disease: Secondary | ICD-10-CM | POA: Diagnosis not present

## 2020-11-22 DIAGNOSIS — K5909 Other constipation: Secondary | ICD-10-CM

## 2020-11-22 MED ORDER — NA SULFATE-K SULFATE-MG SULF 17.5-3.13-1.6 GM/177ML PO SOLN: 1.0000 | Freq: Once | ORAL | 0 refills | Status: AC

## 2020-11-22 MED ORDER — POLYSACCHARIDE IRON COMPLEX 150 MG PO CAPS: 150.0000 mg | ORAL_CAPSULE | Freq: Every day | ORAL | 2 refills | Status: DC

## 2020-11-22 NOTE — Progress Notes (Signed)
Sharon Shepherd 70 y.o. [birth year] [id number]  Assessment & Plan:   Encounter Diagnoses  Name Primary?  . Other iron deficiency anemia Yes  . Celiac disease   . Chronic constipation   . B12 deficiency     Schedule EGD and colonoscopy.  Need to press forward and work-up iron and B12 deficiency.  She may have celiac disease though there are some unusual issues and that 2020 her antibody testing was negative.  I plan to biopsy the duodenum regardless.   Start Niferex to see if she tolerates that.  If not may need to consider parenteral iron perhaps I can get this set up through the infusion center at the pulmonary office on [address].  I do not think the vocal cord implant will be an issue regarding her EGD but it is noted.  The risks and benefits as well as alternatives of endoscopic procedure(s) have been discussed and reviewed. All questions answered. The patient agrees to proceed.   Subjective:   Chief Complaint: Iron deficiency anemia history of celiac disease question  HPI 70 year old white woman with a history of celiac disease, and iron deficiency anemia last seen in 2020 at which point her celiac testing was negative i.e. normal and I recommended EGD and colonoscopy.  They also did HLA testing and her DQ 2 was positive but DQ 8 and she had a less than 1% risk which is the general population risk of having celiac disease.  - she said she would call back to schedule and has not yet so far.  She has seen Dr. Silvio Pate and was sent back for reevaluation.  She remains gluten-free and does not see any signs of bleeding.  She is intolerant of iron oral over-the-counter.  She is on B12 but has not had B12 level rechecked.  She has a vocal cord implant and is concerned about EGD in that setting.  She is gluten-free 90% of the time she says. CBC Latest Ref Rng & Units 09/25/2020 01/25/2020 01/20/2019  WBC 4.0 - 10.5 K/uL 5.1 3.9 3.8  Hemoglobin 12.0 - 15.0 g/dL 10.0(L) 11.0(L)  10.8(L)  Hematocrit 36.0 - 46.0 % 32.9(L) 36.3 35.4  Platelets 150 - 400 K/uL 147(L) 187 196     Lab Results  Component Value Date   IRON 27 01/25/2020   TIBC 467 (H) 01/25/2020   FERRITIN 9 (L) 01/25/2020   Tissue transglutaminase IgA less than 2 with a normal IgA level in 2020  Colonoscopy 2015 diverticulosis  EGD 2004 celiac biopsies positive showing increased intraepithelial lymphocytosis, crypt hyperplasia and partial villous atrophy submucosal antral nodule chronic gastritis and intestinal metaplasia Allergies  Allergen Reactions  . Codeine     REACTION: Itching   Current Meds  Medication Sig  . albuterol (PROVENTIL HFA;VENTOLIN HFA) 108 (90 Base) MCG/ACT inhaler Inhale 2 puffs into the lungs every 6 (six) hours as needed for wheezing or shortness of breath.  Marland Kitchen aspirin 81 MG chewable tablet Chew 81 mg by mouth every other day.  Marland Kitchen atorvastatin (LIPITOR) 20 MG tablet TAKE 1 TABLET BY MOUTH  DAILY  . cetirizine (ZYRTEC) 10 MG tablet TAKE ONE TABLET BY MOUTH EVERY DAY  . Cholecalciferol (VITAMIN D3) 5000 UNITS CAPS Take 1 tablet by mouth daily.  . cyanocobalamin (,VITAMIN B-12,) 1000 MCG/ML injection Inject 1,000 mcg into the muscle daily.  . diazepam (VALIUM) 5 MG tablet Take 1 tablet (5 mg total) by mouth 3 (three) times daily as needed. For severe vertigo  .  levothyroxine (SYNTHROID) 75 MCG tablet TAKE 1 TABLET BY MOUTH  DAILY  . LINZESS 290 MCG CAPS capsule TAKE 1 CAPSULE(290 MCG) BY MOUTH DAILY BEFORE BREAKFAST  . meclizine (ANTIVERT) 25 MG tablet Take 1-2 tablets (25-50 mg total) by mouth 3 (three) times daily as needed. For vertigo  . Syringe/Needle, Disp, 25G X 1-1/2" 1 ML MISC 1 each by Does not apply route every 30 (thirty) days.  . valsartan-hydrochlorothiazide (DIOVAN HCT) 160-12.5 MG tablet Take 1 tablet by mouth daily.   Past Medical History:  Diagnosis Date  . Anemia   . Celiac disease   . COVID 09/25/2020  . Diverticulitis   . GERD (gastroesophageal reflux  disease)   . Hyperlipidemia   . Hypertension   . Sleep disturbance   . Thyroiditis, lymphocytic    surgery 2013  . Vitamin B12 deficiency    unresponsive to oral replacement   Past Surgical History:  Procedure Laterality Date  . COLONOSCOPY    . ESOPHAGOGASTRODUODENOSCOPY  03/04   colon  . Laryngeal implant  2/13   after damage during thyroidectomy  . THYROIDECTOMY  12/12   Lymphocytic thyroiditis with goiter--Dr [place] History   Social History Narrative   The patient is married and has 2 children.   She is employed in Librarian, academic at [employer]   Rare alcohol and never tobacco   family history includes Heart attack in her mother; Other in her father.   Review of Systems Back pain  Objective:   Physical Exam @BP  120/60   Pulse 78   Ht 5' 6"  (1.676 m)   Wt 202 lb (91.6 kg)   BMI 32.60 kg/m @  General:  NAD Eyes:   anicteric Lungs:  clear Heart::  S1S2 no rubs, murmurs or gallops Abdomen:  soft and nontender, BS+ Ext:   no edema, cyanosis or clubbing    Data Reviewed:   As above

## 2020-11-22 NOTE — Patient Instructions (Addendum)
You have been scheduled for an endoscopy and colonoscopy. Please follow the written instructions given to you at your visit today. Please pick up your prep supplies at the pharmacy within the next 1-3 days. If you use inhalers (even only as needed), please bring them with you on the day of your procedure.  Try over the counter Niferex and see how well you tolerate it.  Please get your labs drawn and have the results faxed to [place] at [phone].  Due to recent changes in healthcare laws, you may see the results of your imaging and laboratory studies on MyChart before your provider has had a chance to review them.  We understand that in some cases there may be results that are confusing or concerning to you. Not all laboratory results come back in the same time frame and the provider may be waiting for multiple results in order to interpret others.  Please give [place] 48 hours in order for your provider to thoroughly review all the results before contacting the office for clarification of your results.     I appreciate the opportunity to care for you. Silvano Rusk, MD, [facility]

## 2020-11-23 LAB — VITAMIN B12: Vitamin B-12: 320 pg/mL (ref 232–1245)

## 2020-11-24 ENCOUNTER — Other Ambulatory Visit: Payer: Self-pay | Admitting: Internal Medicine

## 2020-11-25 LAB — CBC WITH DIFFERENTIAL/PLATELET
Basophils Absolute: 0 10*3/uL (ref 0.0–0.2)
Basos: 1 %
EOS (ABSOLUTE): 0.1 10*3/uL (ref 0.0–0.4)
Eos: 2 %
Hematocrit: 34.6 % (ref 34.0–46.6)
Hemoglobin: 10.4 g/dL — ABNORMAL LOW (ref 11.1–15.9)
Immature Grans (Abs): 0 10*3/uL (ref 0.0–0.1)
Immature Granulocytes: 0 %
Lymphocytes Absolute: 1.8 10*3/uL (ref 0.7–3.1)
Lymphs: 38 %
MCH: 20.7 pg — ABNORMAL LOW (ref 26.6–33.0)
MCHC: 30.1 g/dL — ABNORMAL LOW (ref 31.5–35.7)
MCV: 69 fL — ABNORMAL LOW (ref 79–97)
Monocytes Absolute: 0.6 10*3/uL (ref 0.1–0.9)
Monocytes: 13 %
Neutrophils Absolute: 2.1 10*3/uL (ref 1.4–7.0)
Neutrophils: 46 %
Platelets: 207 10*3/uL (ref 150–450)
RBC: 5.03 x10E6/uL (ref 3.77–5.28)
RDW: 17.1 % — ABNORMAL HIGH (ref 11.7–15.4)
WBC: 4.7 10*3/uL (ref 3.4–10.8)

## 2020-12-01 ENCOUNTER — Other Ambulatory Visit: Payer: Self-pay

## 2020-12-01 ENCOUNTER — Encounter: Payer: Self-pay | Admitting: Cardiology

## 2020-12-01 ENCOUNTER — Ambulatory Visit (INDEPENDENT_AMBULATORY_CARE_PROVIDER_SITE_OTHER): Payer: 59 | Admitting: Cardiology

## 2020-12-01 VITALS — BP 116/84 | HR 89 | Ht 66.0 in | Wt 204.0 lb

## 2020-12-01 DIAGNOSIS — R55 Syncope and collapse: Secondary | ICD-10-CM

## 2020-12-01 DIAGNOSIS — E7800 Pure hypercholesterolemia, unspecified: Secondary | ICD-10-CM

## 2020-12-01 DIAGNOSIS — I10 Essential (primary) hypertension: Secondary | ICD-10-CM | POA: Diagnosis not present

## 2020-12-01 NOTE — Progress Notes (Signed)
Cardiology Office Note:    Date:  12/01/2020   ID:  Sharon, Shepherd September 23, 1950, MRN [id number]  PCP:  Sharon Carbon, MD   [place]  Cardiologist:  Kate Sable, MD  Advanced Practice Provider:  No care team member to display Electrophysiologist:  None       Referring MD: Sharon Carbon, MD   Chief Complaint  Patient presents with  . Follow-up    After testing--echo and zio     History of Present Illness:    Sharon Shepherd is a 70 y.o. female with a hx of hypertension, hyperlipidemia who presents for follow-up.  She was last seen due to syncope.  Syncopal episode occurred once while she was sitting on toilet.  Denies having any further episodes.  Denies any previous syncopal episodes.  Echo and cardiac monitor were placed to evaluate cardiac etiology of syncope.  Has no concerns at this time, presents for testing results.  Prior notes  her sister has a history of WPW.  Father had MI in his 56s, mother MI in her 67s.  Past Medical History:  Diagnosis Date  . Anemia   . Celiac disease   . COVID 09/25/2020  . Diverticulitis   . GERD (gastroesophageal reflux disease)   . Hyperlipidemia   . Hypertension   . Sleep disturbance   . Thyroiditis, lymphocytic    surgery 2013  . Vitamin B12 deficiency    unresponsive to oral replacement    Past Surgical History:  Procedure Laterality Date  . COLONOSCOPY    . ESOPHAGOGASTRODUODENOSCOPY  03/04   colon  . Laryngeal implant  2/13   after damage during thyroidectomy  . THYROIDECTOMY  12/12   Lymphocytic thyroiditis with goiter--Dr Rochel Brome    Current Medications: Current Meds  Medication Sig  . albuterol (PROVENTIL HFA;VENTOLIN HFA) 108 (90 Base) MCG/ACT inhaler Inhale 2 puffs into the lungs every 6 (six) hours as needed for wheezing or shortness of breath.  Marland Kitchen aspirin 81 MG chewable tablet Chew 81 mg by mouth every other day.  Marland Kitchen atorvastatin (LIPITOR) 20 MG tablet  TAKE 1 TABLET BY MOUTH  DAILY  . cetirizine (ZYRTEC) 10 MG tablet TAKE ONE TABLET BY MOUTH EVERY DAY  . Cholecalciferol (VITAMIN D3) 5000 UNITS CAPS Take 1 tablet by mouth daily.  . cyanocobalamin (,VITAMIN B-12,) 1000 MCG/ML injection Inject 1,000 mcg into the muscle daily.  . diazepam (VALIUM) 5 MG tablet Take 1 tablet (5 mg total) by mouth 3 (three) times daily as needed. For severe vertigo  . iron polysaccharides (NIFEREX) 150 MG capsule Take 1 capsule (150 mg total) by mouth daily.  Marland Kitchen levothyroxine (SYNTHROID) 75 MCG tablet TAKE 1 TABLET BY MOUTH  DAILY  . LINZESS 290 MCG CAPS capsule TAKE 1 CAPSULE(290 MCG) BY MOUTH DAILY BEFORE BREAKFAST  . meclizine (ANTIVERT) 25 MG tablet Take 1-2 tablets (25-50 mg total) by mouth 3 (three) times daily as needed. For vertigo  . Syringe/Needle, Disp, 25G X 1-1/2" 1 ML MISC 1 each by Does not apply route every 30 (thirty) days.  . valsartan-hydrochlorothiazide (DIOVAN HCT) 160-12.5 MG tablet Take 1 tablet by mouth daily.     Allergies:   Codeine   Social History   Socioeconomic History  . Marital status: Married    Spouse name: Not on file  . Number of children: 2  . Years of education: Not on file  . Highest education level: Not on file  Occupational History  .  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 Smoking status: Never Smoker  . Smokeless tobacco: Never Used  Vaping Use  . Vaping Use: Never used  Substance and Sexual Activity  . Alcohol use: Yes    Comment: rare  . Drug use: No  . Sexual activity: Not on file  Other Topics Concern  . Not on file  Social History Narrative   The patient is married and has 2 children.   She is employed in Librarian, academic at [employer]   Rare alcohol and never tobacco   Social Determinants of Radio broadcast assistant Strain: Not on file  Food Insecurity: Not on file  Transportation Needs: Not on file  Physical Activity: Not on file  Stress: Not on file  Social Connections:  Not on file     Family History: The patient's family history includes Heart attack in her mother; Other in her father. There is no history of Colon cancer or Breast cancer.  ROS:   Please see the history of present illness.     All other systems reviewed and are negative.  EKGs/Labs/Other Studies Reviewed:    The following studies were reviewed today:   EKG:  EKG not  ordered today.   Recent Labs: 01/25/2020: TSH 0.921 09/25/2020: ALT 17; BUN 12; Creatinine, Ser 0.84; Potassium 3.6; Sodium 138 11/24/2020: Hemoglobin 10.4; Platelets 207  Recent Lipid Panel    Component Value Date/Time   CHOL 142 01/25/2020 0845   TRIG 68 01/25/2020 0845   HDL 55 01/25/2020 0845   CHOLHDL 2.6 01/25/2020 0845   LDLCALC 73 01/25/2020 0845     Risk Assessment/Calculations:      Physical Exam:    VS:  BP 116/84   Pulse 89   Ht 5' 6"  (1.676 m)   Wt 204 lb (92.5 kg)   BMI 32.93 kg/m     Wt Readings from Last 3 Encounters:  12/01/20 204 lb (92.5 kg)  11/22/20 202 lb (91.6 kg)  10/13/20 205 lb (93 kg)     GEN:  Well nourished, well developed in no acute distress HEENT: Normal NECK: No JVD; No carotid bruits LYMPHATICS: No lymphadenopathy CARDIAC: RRR, no murmurs, rubs, gallops RESPIRATORY:  Clear to auscultation without rales, wheezing or rhonchi  ABDOMEN: Soft, non-tender, non-distended MUSCULOSKELETAL:  No edema; No deformity  SKIN: Warm and dry NEUROLOGIC:  Alert and oriented x 3 PSYCHIATRIC:  Normal affect   ASSESSMENT:    1. Syncope, unspecified syncope type   2. Primary hypertension   3. Pure hypercholesterolemia    PLAN:    In order of problems listed above:  1. Patient with one episode of syncope, likely vasovagal in etiology.  Previous orthostatic vitals with no evidence for orthostasis.  2-week cardiac monitor with no significant arrhythmias, overall benign.  Echocardiogram showed normal systolic function, EF 10%, impaired relaxation.  No gross structural or  valvular abnormalities.  Patient made aware of results and reassured. 2. Hypertension, BP controlled, continue HCTZ, valsartan 3. Hyperlipidemia, cholesterol controlled, continue Lipitor  Follow-up as needed     Medication Adjustments/Labs and Tests Ordered: Current medicines are reviewed at length with the patient today.  Concerns regarding medicines are outlined above.  No orders of the defined types were placed in this encounter.  No orders of the defined types were placed in this encounter.   Patient Instructions  Medication Instructions:  Your physician recommends that you continue on your current medications as directed. Please refer  to the Current Medication list given to you today. *If you need a refill on your cardiac medications before your next appointment, please call your pharmacy*   Lab Work: None ordered If you have labs (blood work) drawn today and your tests are completely normal, you will receive your results only by: Marland Kitchen MyChart Message (if you have MyChart) OR . A paper copy in the mail If you have any lab test that is abnormal or we need to change your treatment, we will call you to review the results.   Testing/Procedures: None ordered   Follow-Up: At [facility], you and your health needs are our priority.  As part of our continuing mission to provide you with exceptional heart care, we have created designated Provider Care Teams.  These Care Teams include your primary Cardiologist (physician) and Advanced Practice Providers (APPs -  Physician Assistants and Nurse Practitioners) who all work together to provide you with the care you need, when you need it.  We recommend signing up for the patient portal called "MyChart".  Sign up information is provided on this After Visit Summary.  MyChart is used to connect with patients for Virtual Visits (Telemedicine).  Patients are able to view lab/test results, encounter notes, upcoming appointments, etc.  Non-urgent  messages can be sent to your provider as well.   To learn more about what you can do with MyChart, go to [email or web address].    Your next appointment:   Follow up as needed   The format for your next appointment:   In Person  Provider:   Kate Sable, MD   Other Instructions      Signed, Kate Sable, MD  12/01/2020 5:02 PM    [place]

## 2020-12-01 NOTE — Patient Instructions (Signed)
Medication Instructions:  Your physician recommends that you continue on your current medications as directed. Please refer to the Current Medication list given to you today. *If you need a refill on your cardiac medications before your next appointment, please call your pharmacy*   Lab Work: None ordered If you have labs (blood work) drawn today and your tests are completely normal, you will receive your results only by: Marland Kitchen MyChart Message (if you have MyChart) OR . A paper copy in the mail If you have any lab test that is abnormal or we need to change your treatment, we will call you to review the results.   Testing/Procedures: None ordered   Follow-Up: At [facility], you and your health needs are our priority.  As part of our continuing mission to provide you with exceptional heart care, we have created designated Provider Care Teams.  These Care Teams include your primary Cardiologist (physician) and Advanced Practice Providers (APPs -  Physician Assistants and Nurse Practitioners) who all work together to provide you with the care you need, when you need it.  We recommend signing up for the patient portal called "MyChart".  Sign up information is provided on this After Visit Summary.  MyChart is used to connect with patients for Virtual Visits (Telemedicine).  Patients are able to view lab/test results, encounter notes, upcoming appointments, etc.  Non-urgent messages can be sent to your provider as well.   To learn more about what you can do with MyChart, go to [email or web address].    Your next appointment:   Follow up as needed   The format for your next appointment:   In Person  Provider:   Kate Sable, MD   Other Instructions

## 2020-12-25 ENCOUNTER — Other Ambulatory Visit: Payer: Self-pay

## 2020-12-25 ENCOUNTER — Encounter: Payer: Self-pay | Admitting: Internal Medicine

## 2020-12-25 ENCOUNTER — Ambulatory Visit (AMBULATORY_SURGERY_CENTER): Payer: 59 | Admitting: Internal Medicine

## 2020-12-25 VITALS — BP 97/43 | HR 78 | Temp 97.0°F | Resp 18 | Ht 66.0 in | Wt 202.0 lb

## 2020-12-25 DIAGNOSIS — K900 Celiac disease: Secondary | ICD-10-CM

## 2020-12-25 DIAGNOSIS — K449 Diaphragmatic hernia without obstruction or gangrene: Secondary | ICD-10-CM

## 2020-12-25 DIAGNOSIS — K319 Disease of stomach and duodenum, unspecified: Secondary | ICD-10-CM

## 2020-12-25 DIAGNOSIS — K317 Polyp of stomach and duodenum: Secondary | ICD-10-CM | POA: Diagnosis not present

## 2020-12-25 DIAGNOSIS — K621 Rectal polyp: Secondary | ICD-10-CM

## 2020-12-25 DIAGNOSIS — K5730 Diverticulosis of large intestine without perforation or abscess without bleeding: Secondary | ICD-10-CM

## 2020-12-25 DIAGNOSIS — D508 Other iron deficiency anemias: Secondary | ICD-10-CM

## 2020-12-25 DIAGNOSIS — K5909 Other constipation: Secondary | ICD-10-CM

## 2020-12-25 DIAGNOSIS — D128 Benign neoplasm of rectum: Secondary | ICD-10-CM

## 2020-12-25 MED ORDER — SODIUM CHLORIDE 0.9 % IV SOLN: 500.0000 mL | Freq: Once | INTRAVENOUS | Status: DC

## 2020-12-25 NOTE — Progress Notes (Signed)
Called to room to assist during endoscopic procedure.  Patient ID and intended procedure confirmed with present staff. Received instructions for my participation in the procedure from the performing physician.  

## 2020-12-25 NOTE — Progress Notes (Signed)
VS-CW

## 2020-12-25 NOTE — Patient Instructions (Addendum)
Nothing bad seen.  There were a few findings - there is a small submucosal tumor in stomach. 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 These are usually innocent and would have nothing to do with anemia. It was not there in 2004. I anticipate that you will need a special ultrasound test of that - called EUS or endoscopic ultrasound.  There was a tiny stomach polyp - removed. Also have a hiatal hernia. I took intestinal biopsies and stomach biopsies as a matter of routine given anemia.  There were 2 tiny rectal polyps removed at colonoscopy and you have diverticulosis in the colon - no cause of anemia here, either.  Once I get the results I will contact you.  I appreciate the opportunity to care for you. Gatha Mayer, MD, [facility]   Discharge instructions given. Handouts on polyps,diverticulosis and a hiatal hernia. Resume previous medications. YOU HAD AN ENDOSCOPIC PROCEDURE TODAY AT [facility]:   Refer to the procedure report that was given to you for any specific questions about what was found during the examination.  If the procedure report does not answer your questions, please call your gastroenterologist to clarify.  If you requested that your care partner not be given the details of your procedure findings, then the procedure report has been included in a sealed envelope for you to review at your convenience later.  YOU SHOULD EXPECT: Some feelings of bloating in the abdomen. Passage of more gas than usual.  Walking can help get rid of the air that was put into your GI tract during the procedure and reduce the bloating. If you had a lower endoscopy (such as a colonoscopy or flexible sigmoidoscopy) you may notice spotting of blood in your stool or on the toilet paper. If you underwent a bowel prep for your procedure, you may not have a normal bowel movement for a few days.  Please  Note:  You might notice some irritation and congestion in your nose or some drainage.  This is from the oxygen used during your procedure.  There is no need for concern and it should clear up in a day or so.  SYMPTOMS TO REPORT IMMEDIATELY:   Following lower endoscopy (colonoscopy or flexible sigmoidoscopy):  Excessive amounts of blood in the stool  Significant tenderness or worsening of abdominal pains  Swelling of the abdomen that is new, acute  Fever of 100F or higher   Following upper endoscopy (EGD)  Vomiting of blood or coffee ground material  New chest pain or pain under the shoulder blades  Painful or persistently difficult swallowing  New shortness of breath  Fever of 100F or higher  Black, tarry-looking stools  For urgent or emergent issues, a gastroenterologist can be reached at any hour by calling [phone]. Do not use MyChart messaging for urgent concerns.    DIET:  We do recommend a small meal at first, but then you may proceed to your regular diet.  Drink plenty of fluids but you should avoid alcoholic beverages for 24 hours.  ACTIVITY:  You should plan to take it easy for the rest of today and you should NOT DRIVE or use heavy machinery until tomorrow (because of the sedation medicines used during the test).    FOLLOW UP: Our staff will call the number listed on your records 48-72 hours following your procedure to check  on you and address any questions or concerns that you may have regarding the information given to you following your procedure. If we do not reach you, we will leave a message.  We will attempt to reach you two times.  During this call, we will ask if you have developed any symptoms of COVID 19. If you develop any symptoms (ie: fever, flu-like symptoms, shortness of breath, cough etc.) before then, please call [phone].  If you test positive for Covid 19 in the 2 weeks post procedure, please call and report this information to [place].    If any  biopsies were taken you will be contacted by phone or by letter within the next 1-3 weeks.  Please call us at [phone] if you have not heard about the biopsies in 3 weeks.    SIGNATURES/CONFIDENTIALITY: You and/or your care partner have signed paperwork which will be entered into your electronic medical record.  These signatures attest to the fact that that the information above on your After Visit Summary has been reviewed and is understood.  Full responsibility of the confidentiality of this discharge information lies with you and/or your care-partner.

## 2020-12-25 NOTE — Progress Notes (Signed)
Approx 1140 Pt brady down to HR 22.  Dr Carlean Purl halted trying to get to cecum and "pulled the loop" out.  Robinul given before we started again.  While trying to advance pt went brady again with 2 separate 6 seconds asystole.  Procedure halted again and scope pulled back an atropine 1 mg given.  Pts BP did drop but corrected quickly with atropine

## 2020-12-25 NOTE — Op Note (Signed)
[place] Patient Name: Sharon Shepherd Procedure Date: 12/25/2020 11:15 AM MRN: [id number] Endoscopist: Gatha Mayer , MD Age: 70 Referring MD:  Date of Birth: Dec 06, 1950 Gender: Female Account #: [id number] Procedure:                Upper GI endoscopy Indications:              Iron deficiency anemia, Celiac disease Medicines:                Propofol per Anesthesia, Monitored Anesthesia Care Procedure:                Pre-Anesthesia Assessment:                           - Prior to the procedure, a History and Physical                            was performed, and patient medications and                            allergies were reviewed. The patient's tolerance of                            previous anesthesia was also reviewed. The risks                            and benefits of the procedure and the sedation                            options and risks were discussed with the patient.                            All questions were answered, and informed consent                            was obtained. Prior Anticoagulants: The patient has                            taken no previous anticoagulant or antiplatelet                            agents. ASA Grade Assessment: II - A patient with                            mild systemic disease. After reviewing the risks                            and benefits, the patient was deemed in                            satisfactory condition to undergo the procedure.                           After obtaining informed consent, the endoscope was  passed under direct vision. Throughout the                            procedure, the patient's blood pressure, pulse, and                            oxygen saturations were monitored continuously. The                            #[id number] was introduced through the mouth, and                            advanced to the second part of duodenum. The upper                             GI endoscopy was accomplished without difficulty.                            The patient tolerated the procedure well. Scope In: Scope Out: Findings:                 A 5 cm hiatal hernia was present.                           A single 3 mm sessile polyp with no stigmata of                            recent bleeding was found on the anterior wall of                            the gastric body. The polyp was removed with a cold                            biopsy forceps. Resection and retrieval were                            complete. Verification of patient identification                            for the specimen was done. Estimated blood loss was                            minimal.                           A small, submucosal mass with no bleeding and no                            stigmata of recent bleeding was found on the                            posterior wall of the gastric body.  Patchy mildly erythematous mucosa without bleeding                            was found in the gastric antrum. Biopsies were                            taken with a cold forceps for histology.                            Verification of patient identification for the                            specimen was done. Estimated blood loss was minimal.                           The examined duodenum was normal. Biopsies for                            histology were taken with a cold forceps for                            evaluation of celiac disease. Verification of                            patient identification for the specimen was done.                            Estimated blood loss was minimal.                           The exam was otherwise without abnormality.                           The gastroesophageal flap valve was visualized                            endoscopically and classified as Hill Grade IV (no                            fold, wide open lumen, hiatal hernia  present). Complications:            No immediate complications. Estimated Blood Loss:     Estimated blood loss was minimal. Impression:               - 5 cm hiatal hernia.                           - A single gastric polyp. Resected and retrieved.                           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                           -  Erythematous mucosa in the antrum. Biopsied.                           - Normal examined duodenum. Biopsied.                           - The examination was otherwise normal.                           - Gastroesophageal flap valve classified as Hill                            Grade IV (no fold, wide open lumen, hiatal hernia                            present). Recommendation:           - Patient has a contact number available for                            emergencies. The signs and symptoms of potential                            delayed complications were discussed with the                            patient. Return to normal activities tomorrow.                            Written discharge instructions were provided to the                            patient.                           - Resume previous diet.                           - Continue present medications.                           - See the other procedure note for documentation of                            additional recommendations.                           - Await pathology results.                           - Cannot tolerate Niferex either so will need to                            consider parenteral iron Tx Gatha Mayer, MD 12/25/2020 12:09:38 PM This report has been signed electronically.

## 2020-12-25 NOTE — Op Note (Signed)
[place] Patient Name: Sharon Shepherd Procedure Date: 12/25/2020 11:14 AM MRN: [id number] Endoscopist: Gatha Mayer , MD Age: 70 Referring MD:  Date of Birth: Jul 11, 1951 Gender: Female Account #: [id number] Procedure:                Colonoscopy Indications:              Iron deficiency anemia Medicines:                Propofol per Anesthesia, Monitored Anesthesia Care Procedure:                Pre-Anesthesia Assessment:                           - Prior to the procedure, a History and Physical                            was performed, and patient medications and                            allergies were reviewed. The patient's tolerance of                            previous anesthesia was also reviewed. The risks                            and benefits of the procedure and the sedation                            options and risks were discussed with the patient.                            All questions were answered, and informed consent                            was obtained. Prior Anticoagulants: The patient has                            taken no previous anticoagulant or antiplatelet                            agents. ASA Grade Assessment: II - A patient with                            mild systemic disease. After reviewing the risks                            and benefits, the patient was deemed in                            satisfactory condition to undergo the procedure.                           After obtaining informed consent, the colonoscope  was passed under direct vision. Throughout the                            procedure, the patient's blood pressure, pulse, and                            oxygen saturations were monitored continuously. The                            Olympus PCF-H190DL ([id number]) Colonoscope was                            introduced through the anus and advanced to the the                            cecum,  identified by appendiceal orifice and                            ileocecal valve. The colonoscopy was somewhat                            difficult due to an endoscope malfunction.                            Successful completion of the procedure was aided by                            withdrawing the scope and replacing with the                            pediatric colonoscope. The colonoscopy was somewhat                            difficult due to a tortuous colon. Successful                            completion of the procedure was aided by                            straightening and shortening the scope to obtain                            bowel loop reduction. The patient tolerated the                            procedure fairly well. The quality of the bowel                            preparation was excellent. The bowel preparation                            used was SUPREP via split dose instruction. Scope In: 11:48:13 AM Scope Out: 11:57:57 AM Scope Withdrawal Time: 0 hours 6 minutes 29 seconds  Total Procedure Duration: 0 hours 9 minutes 44 seconds  Findings:                 The perianal and digital rectal examinations were                            normal.                           Many small and large-mouthed diverticula were found                            in the sigmoid colon, descending colon, transverse                            colon and ascending colon.                           Two sessile polyps were found in the distal rectum.                            The polyps were diminutive in size. These polyps                            were removed with a cold biopsy forceps. Resection                            and retrieval were complete. Verification of                            patient identification for the specimen was done.                            Estimated blood loss was minimal.                           The exam was otherwise without abnormality on                             direct and retroflexion views. Complications:            No immediate complications. Estimated Blood Loss:     Estimated blood loss was minimal. Impression:               - Severe diverticulosis in the sigmoid colon, in                            the descending colon, in the transverse colon and                            in the ascending colon.                           - Two diminutive polyps in the distal rectum,  removed with a cold biopsy forceps. Resected and                            retrieved.                           - The examination was otherwise normal on direct                            and retroflexion views. An up/down cable broke                            during procedure and scope was changed without                            problem. Patient also was treated for bradycardia                            with glycopyrrolate and atropine (vagal response to                            scope passage in sigmoid). Recommendation:           - Patient has a contact number available for                            emergencies. The signs and symptoms of potential                            delayed complications were discussed with the                            patient. Return to normal activities tomorrow.                            Written discharge instructions were provided to the                            patient.                           - Resume previous diet.                           - Continue present medications.                           - Await pathology results.                           - Repeat colonoscopy is recommended. The                            colonoscopy date will be determined after pathology  results from today's exam become available for                            review.                           - Will likely need parenteral iron - intolerant of                             multiple oral supplements Gatha Mayer, MD 12/25/2020 12:14:30 PM This report has been signed electronically.

## 2020-12-25 NOTE — Progress Notes (Signed)
Report to PACU, RN, vss, BBS= Clear.  

## 2020-12-27 ENCOUNTER — Telehealth: Payer: Self-pay | Admitting: *Deleted

## 2020-12-27 NOTE — Telephone Encounter (Signed)
  Follow up Call-  Call back number 12/25/2020  Post procedure Call Back phone  # [phone]  Permission to leave phone message Yes  Some recent data might be hidden     Patient questions:  Do you have a fever, pain , or abdominal swelling? No. Pain Score  0 *  Have you tolerated food without any problems? Yes.    Have you been able to return to your normal activities? Yes.    Do you have any questions about your discharge instructions: Diet   No. Medications  No. Follow up visit  No.  Do you have questions or concerns about your Care? No.  Actions: * If pain score is 4 or above: No action needed, pain <4.  1. Have you developed a fever since your procedure? no  2.   Have you had an respiratory symptoms (SOB or cough) since your procedure? no  3.   Have you tested positive for COVID 19 since your procedure no  4.   Have you had any family members/close contacts diagnosed with the COVID 19 since your procedure?  no   If yes to any of these questions please route to Joylene John, RN and Joella Prince, RN

## 2021-01-09 ENCOUNTER — Other Ambulatory Visit: Payer: Self-pay

## 2021-01-09 DIAGNOSIS — D508 Other iron deficiency anemias: Secondary | ICD-10-CM

## 2021-01-10 ENCOUNTER — Other Ambulatory Visit: Payer: Self-pay

## 2021-01-10 ENCOUNTER — Telehealth: Payer: Self-pay

## 2021-01-10 DIAGNOSIS — K319 Disease of stomach and duodenum, unspecified: Secondary | ICD-10-CM

## 2021-01-10 NOTE — Telephone Encounter (Signed)
-----   Message from Milus Banister, MD sent at 01/10/2021  9:45 AM EDT ----- Regarding: RE: EUS eval? Glendell Docker, I looked at your recent EGD.  Endoscopic ultrasound certainly a good next step.  We will coordinate.  Thanks   Nevin Kozuch, This patient needs endoscopic ultrasound with either myself or Dr. Rush Landmark for incidental gastric lesion.  First available.  Thanks  Wynetta Fines   ----- Message ----- From: Gatha Mayer, MD Sent: 01/06/2021   7:38 AM EDT To: Milus Banister, MD, # Subject: EUS eval?                                      Guys,  She has a small submucosal lesion seen in EGD photos - appreciate getting a non-urgent EUS  Thx  Glendell Docker

## 2021-01-10 NOTE — Telephone Encounter (Signed)
EUS on 02/15/21 at 815 am with Dr Ardis Hughs at [facility]. She was offered an earlier date but declined due to being out of town.  She has been advised and instructed.  I have also sent the information to My Chart.

## 2021-01-13 ENCOUNTER — Other Ambulatory Visit: Payer: Self-pay | Admitting: Internal Medicine

## 2021-01-26 ENCOUNTER — Inpatient Hospital Stay: Payer: 59

## 2021-01-26 ENCOUNTER — Encounter: Payer: Self-pay | Admitting: Internal Medicine

## 2021-01-26 ENCOUNTER — Ambulatory Visit (INDEPENDENT_AMBULATORY_CARE_PROVIDER_SITE_OTHER): Payer: 59 | Admitting: Internal Medicine

## 2021-01-26 ENCOUNTER — Other Ambulatory Visit: Payer: Self-pay

## 2021-01-26 ENCOUNTER — Inpatient Hospital Stay: Payer: 59 | Attending: Oncology | Admitting: Oncology

## 2021-01-26 ENCOUNTER — Encounter: Payer: Self-pay | Admitting: Oncology

## 2021-01-26 VITALS — BP 128/81 | HR 69 | Temp 97.3°F | Resp 16 | Ht 65.5 in | Wt 202.0 lb

## 2021-01-26 VITALS — BP 114/78 | HR 67 | Temp 97.1°F | Ht 65.5 in | Wt 201.0 lb

## 2021-01-26 DIAGNOSIS — Z79899 Other long term (current) drug therapy: Secondary | ICD-10-CM | POA: Diagnosis not present

## 2021-01-26 DIAGNOSIS — I10 Essential (primary) hypertension: Secondary | ICD-10-CM | POA: Insufficient documentation

## 2021-01-26 DIAGNOSIS — Z0000 Encounter for general adult medical examination without abnormal findings: Secondary | ICD-10-CM | POA: Diagnosis not present

## 2021-01-26 DIAGNOSIS — E538 Deficiency of other specified B group vitamins: Secondary | ICD-10-CM | POA: Insufficient documentation

## 2021-01-26 DIAGNOSIS — K219 Gastro-esophageal reflux disease without esophagitis: Secondary | ICD-10-CM | POA: Diagnosis not present

## 2021-01-26 DIAGNOSIS — K3189 Other diseases of stomach and duodenum: Secondary | ICD-10-CM | POA: Diagnosis not present

## 2021-01-26 DIAGNOSIS — E785 Hyperlipidemia, unspecified: Secondary | ICD-10-CM | POA: Diagnosis not present

## 2021-01-26 DIAGNOSIS — E039 Hypothyroidism, unspecified: Secondary | ICD-10-CM | POA: Diagnosis not present

## 2021-01-26 DIAGNOSIS — Z23 Encounter for immunization: Secondary | ICD-10-CM

## 2021-01-26 DIAGNOSIS — D509 Iron deficiency anemia, unspecified: Secondary | ICD-10-CM | POA: Diagnosis present

## 2021-01-26 DIAGNOSIS — Z8249 Family history of ischemic heart disease and other diseases of the circulatory system: Secondary | ICD-10-CM | POA: Insufficient documentation

## 2021-01-26 NOTE — Assessment & Plan Note (Signed)
On levothyroxine  ?

## 2021-01-26 NOTE — Progress Notes (Signed)
Subjective:    Patient ID: Sharon Shepherd, female    DOB: 13-Dec-1950, 70 y.o.   MRN: [id number]  HPI Here for physical This visit occurred during the SARS-CoV-2 public health emergency.  Safety protocols were in place, including screening questions prior to the visit, additional usage of staff PPE, and extensive cleaning of exam room while observing appropriate contact time as indicated for disinfecting solutions.   Did have colonoscopy and EGD No clear cause of bleeding Will be seeing hematologist--?IV iron  No new concerns about current rx  Has valium for vertigo--hasn't needed it  Continues to work at [employer] Not sure about retiring  Has been having bilateral low thoracic pain Can "take my breath away"--even just adjusting in bed Stretching, etc help  Current Outpatient Medications on File Prior to Visit  Medication Sig Dispense Refill   albuterol (PROVENTIL HFA;VENTOLIN HFA) 108 (90 Base) MCG/ACT inhaler Inhale 2 puffs into the lungs every 6 (six) hours as needed for wheezing or shortness of breath. 1 Inhaler 1   aspirin 81 MG chewable tablet Chew 81 mg by mouth every other day.     atorvastatin (LIPITOR) 20 MG tablet TAKE 1 TABLET BY MOUTH  DAILY 90 tablet 3   B-D 3CC LUER-LOK SYR 25GX1/2" 25G X 1-1/2" 3 ML MISC See admin instructions.     cetirizine (ZYRTEC) 10 MG tablet TAKE ONE TABLET BY MOUTH EVERY DAY 100 tablet 4   Cholecalciferol (VITAMIN D3) 5000 UNITS CAPS Take 1 tablet by mouth daily.     cyanocobalamin (,VITAMIN B-12,) 1000 MCG/ML injection Inject 1,000 mcg into the muscle daily.     diazepam (VALIUM) 5 MG tablet Take 1 tablet (5 mg total) by mouth 3 (three) times daily as needed. For severe vertigo 30 tablet 0   levothyroxine (SYNTHROID) 75 MCG tablet TAKE 1 TABLET BY MOUTH  DAILY 90 tablet 0   LINZESS 290 MCG CAPS capsule TAKE 1 CAPSULE(290 MCG) BY MOUTH DAILY BEFORE BREAKFAST 90 capsule 3   meclizine (ANTIVERT) 25 MG tablet Take 1-2 tablets (25-50 mg  total) by mouth 3 (three) times daily as needed. For vertigo 60 tablet 0   Syringe/Needle, Disp, 25G X 1-1/2" 1 ML MISC 1 each by Does not apply route every 30 (thirty) days. 1 each 11   valsartan-hydrochlorothiazide (DIOVAN HCT) 160-12.5 MG tablet Take 1 tablet by mouth daily. 90 tablet 3   No current facility-administered medications on file prior to visit.    Allergies  Allergen Reactions   Codeine     REACTION: Itching    Past Medical History:  Diagnosis Date   Allergy    Anemia    Celiac disease    COVID 09/25/2020   Diverticulitis    GERD (gastroesophageal reflux disease)    Hyperlipidemia    Hypertension    Sleep disturbance    Thyroiditis, lymphocytic    surgery 2013   Vitamin B12 deficiency    unresponsive to oral replacement    Past Surgical History:  Procedure Laterality Date   COLONOSCOPY     ESOPHAGOGASTRODUODENOSCOPY  03/04   colon   Laryngeal implant  2/13   after damage during thyroidectomy   THYROIDECTOMY  12/12   Lymphocytic thyroiditis with goiter--Dr Rochel Brome    Family History  Problem Relation Age of Onset   Other Father        pancreatic absess   Heart attack Mother    Colon cancer Neg Hx    Breast cancer Neg Hx  Esophageal cancer Neg Hx    Stomach cancer Neg Hx    Rectal cancer Neg Hx     Social History   Socioeconomic History   Marital status: Married    Spouse name: Not on file   Number of children: 2   Years of education: Not on file   Highest education level: Not on file  Occupational History   Occupation: Building control surveyor: [employer]  Tobacco Use   Smoking status: Never   Smokeless tobacco: Never  Vaping Use   Vaping Use: Never used  Substance and Sexual Activity   Alcohol use: Yes    Comment: rare   Drug use: No   Sexual activity: Not on file  Other Topics Concern   Not on file  Social History Narrative   The patient is married and has 2 children.   She is employed in Librarian, academic at [employer]   Rare  alcohol and never tobacco   Social Determinants of Radio broadcast assistant Strain: Not on file  Food Insecurity: Not on file  Transportation Needs: Not on file  Physical Activity: Not on file  Stress: Not on file  Social Connections: Not on file  Intimate Partner Violence: Not on file   Review of Systems  Constitutional:  Negative for fatigue and unexpected weight change.       Wears seat belt Tries to walk regularly---works from home  HENT:  Positive for tinnitus. Negative for hearing loss.        Keeps up with dentist  Eyes:  Negative for visual disturbance.       No diplopia or unilateral vision loss  Respiratory:  Negative for cough, chest tightness, shortness of breath and wheezing.   Cardiovascular:  Positive for palpitations. Negative for chest pain and leg swelling.       Occ palps at rest  Gastrointestinal:  Negative for blood in stool and constipation.       No heartburn  Endocrine: Negative for polydipsia and polyuria.  Genitourinary:  Negative for difficulty urinating, dyspareunia, dysuria and hematuria.  Musculoskeletal:  Positive for back pain. Negative for arthralgias and joint swelling.  Skin:  Negative for rash.       No recent derm visit  Allergic/Immunologic: Positive for environmental allergies. Negative for immunocompromised state.       Controlled with meds  Neurological:  Negative for dizziness, syncope, light-headedness and headaches.  Hematological:  Negative for adenopathy. Does not bruise/bleed easily.  Psychiatric/Behavioral:  Negative for dysphoric mood. The patient is not nervous/anxious.        Chronic sleep problems--no apnea      Objective:   Physical Exam Constitutional:      Appearance: Normal appearance.  HENT:     Right Ear: Tympanic membrane and ear canal normal.     Left Ear: Tympanic membrane and ear canal normal.     Mouth/Throat:     Pharynx: No oropharyngeal exudate or posterior oropharyngeal erythema.  Eyes:      Conjunctiva/sclera: Conjunctivae normal.     Pupils: Pupils are equal, round, and reactive to light.  Cardiovascular:     Rate and Rhythm: Normal rate and regular rhythm.     Pulses: Normal pulses.     Heart sounds: No murmur heard.   No gallop.  Pulmonary:     Effort: Pulmonary effort is normal.     Breath sounds: Normal breath sounds. No wheezing or rales.  Abdominal:     Palpations:  Abdomen is soft.     Tenderness: There is no abdominal tenderness.  Musculoskeletal:     Cervical back: Neck supple.     Right lower leg: No edema.     Left lower leg: No edema.  Lymphadenopathy:     Cervical: No cervical adenopathy.  Skin:    General: Skin is warm.     Findings: No rash.  Neurological:     General: No focal deficit present.     Mental Status: She is alert and oriented to person, place, and time.  Psychiatric:        Mood and Affect: Mood normal.        Behavior: Behavior normal.           Assessment & Plan:

## 2021-01-26 NOTE — Assessment & Plan Note (Signed)
Healthy but needs to work on fitness Will give pneumovax 23 today COVID second booster soon---flu vaccine in the fall Just had colonoscopy Needs one last Pap at gyn Mammogram every 1-2 years

## 2021-01-26 NOTE — Progress Notes (Signed)
Hematology/Oncology Consult note [facility] Telephone:([phone] [phone]   Patient Care Team: [name], MD as PCP - General Kate Sable, MD as PCP - Cardiology (Cardiology)  REFERRING PROVIDER: Gatha Mayer, MD CHIEF COMPLAINTS/REASON FOR VISIT:  Evaluation of iron deficiency anemia  HISTORY OF PRESENTING ILLNESS:  Sharon Shepherd is a  70 y.o.  female with PMH listed below was seen in consultation at the request of Gatha Mayer, MD   for evaluation of iron deficiency anemia.   Reviewed patient's recent labs  11/24/2020, labs revealed anemia with hemoglobin of 10.4, MCV 69, Reviewed patient's previous labs ordered by primary care physician's office, anemia is chronic onset , duration is since about 2019. No aggravating or improving factors.  Associated signs and symptoms: Patient reports fatigue.  Denies SOB with exertion.  Denies weight loss, easy bruising, hematochezia, hemoptysis, hematuria. Context:  History of iron deficiency: Cannot tolerate oral iron supplementation. She denies black or bloody stool. 12/25/2020, colonoscopy showed severe diverticulosis.  Diminutive polyps in the distal rectum, resected and retrieved. 12/25/2020, EGD showed 5 cm hiatal hernia, single gastric polyp resected and retrieved.  Gastric tumor on the posterior wall of the gastric body.  Erythematous mucosa in the antrum biopsied. Pathology showed that the gastric polyp is hyperplastic polyp.  No malignancy/dysphagia/metaplasia. Rectum/colon polyps are hyperplastic polyps. Gastric antrum and duodenal biopsy are benign.  Celiac disease  Review of Systems  Constitutional:  Positive for fatigue. Negative for appetite change, chills and fever.  HENT:   Negative for hearing loss and voice change.   Eyes:  Negative for eye problems.  Respiratory:  Negative for chest tightness and cough.   Cardiovascular:  Negative for chest pain.   Gastrointestinal:  Negative for abdominal distention, abdominal pain and blood in stool.  Endocrine: Negative for hot flashes.  Genitourinary:  Negative for difficulty urinating and frequency.   Musculoskeletal:  Negative for arthralgias.  Skin:  Negative for itching and rash.  Neurological:  Negative for extremity weakness.  Hematological:  Negative for adenopathy.  Psychiatric/Behavioral:  Negative for confusion.    MEDICAL HISTORY:  Past Medical History:  Diagnosis Date   Allergy    Anemia    Celiac disease    COVID 09/25/2020   Diverticulitis    GERD (gastroesophageal reflux disease)    Hyperlipidemia    Hypertension    Sleep disturbance    Thyroiditis, lymphocytic    surgery 2013   Vitamin B12 deficiency    unresponsive to oral replacement    SURGICAL HISTORY: Past Surgical History:  Procedure Laterality Date   COLONOSCOPY     ESOPHAGOGASTRODUODENOSCOPY  03/04   colon   Laryngeal implant  2/13   after damage during thyroidectomy   THYROIDECTOMY  12/12   Lymphocytic thyroiditis with goiter--Dr Rochel Brome    SOCIAL HISTORY: Social History   Socioeconomic History   Marital status: Married    Spouse name: Not on file   Number of children: 2   Years of education: Not on file   Highest education level: Not on file  Occupational History   Occupation: Building control surveyor: [employer]  Tobacco Use   Smoking status: Never   Smokeless tobacco: Never  Vaping Use   Vaping Use: Never used  Substance and Sexual Activity   Alcohol use: Yes    Comment: rare   Drug use: No   Sexual activity: Not on file  Other Topics Concern   Not on file  Social History Narrative   The patient is married and has 2 children.   She is employed in Librarian, academic at [employer]   Rare alcohol and never tobacco   Social Determinants of Radio broadcast assistant Strain: Not on file  Food Insecurity: Not on file  Transportation Needs: Not on file  Physical Activity: Not on file   Stress: Not on file  Social Connections: Not on file  Intimate Partner Violence: Not on file    FAMILY HISTORY: Family History  Problem Relation Age of Onset   Other Father        pancreatic absess   Heart attack Mother    Colon cancer Neg Hx    Breast cancer Neg Hx    Esophageal cancer Neg Hx    Stomach cancer Neg Hx    Rectal cancer Neg Hx     ALLERGIES:  is allergic to codeine.  MEDICATIONS:  Current Outpatient Medications  Medication Sig Dispense Refill   albuterol (PROVENTIL HFA;VENTOLIN HFA) 108 (90 Base) MCG/ACT inhaler Inhale 2 puffs into the lungs every 6 (six) hours as needed for wheezing or shortness of breath. 1 Inhaler 1   aspirin 81 MG chewable tablet Chew 81 mg by mouth every other day.     atorvastatin (LIPITOR) 20 MG tablet TAKE 1 TABLET BY MOUTH  DAILY 90 tablet 3   B-D 3CC LUER-LOK SYR 25GX1/2" 25G X 1-1/2" 3 ML MISC See admin instructions.     cetirizine (ZYRTEC) 10 MG tablet TAKE ONE TABLET BY MOUTH EVERY DAY 100 tablet 4   Cholecalciferol (VITAMIN D3) 5000 UNITS CAPS Take 1 tablet by mouth daily.     cyanocobalamin (,VITAMIN B-12,) 1000 MCG/ML injection Inject 1,000 mcg into the muscle daily.     diazepam (VALIUM) 5 MG tablet Take 1 tablet (5 mg total) by mouth 3 (three) times daily as needed. For severe vertigo 30 tablet 0   levothyroxine (SYNTHROID) 75 MCG tablet TAKE 1 TABLET BY MOUTH  DAILY 90 tablet 0   LINZESS 290 MCG CAPS capsule TAKE 1 CAPSULE(290 MCG) BY MOUTH DAILY BEFORE BREAKFAST 90 capsule 3   meclizine (ANTIVERT) 25 MG tablet Take 1-2 tablets (25-50 mg total) by mouth 3 (three) times daily as needed. For vertigo 60 tablet 0   Syringe/Needle, Disp, 25G X 1-1/2" 1 ML MISC 1 each by Does not apply route every 30 (thirty) days. 1 each 11   valsartan-hydrochlorothiazide (DIOVAN HCT) 160-12.5 MG tablet Take 1 tablet by mouth daily. 90 tablet 3   No current facility-administered medications for this visit.     PHYSICAL EXAMINATION: ECOG  PERFORMANCE STATUS: 0 - Asymptomatic Vitals:   01/26/21 1126  BP: 128/81  Pulse: 69  Resp: 16  Temp: (!) 97.3 F (36.3 C)  SpO2: 100%   Filed Weights   01/26/21 1126  Weight: 202 lb (91.6 kg)    Physical Exam Constitutional:      General: She is not in acute distress. HENT:     Head: Normocephalic and atraumatic.  Eyes:     General: No scleral icterus. Cardiovascular:     Rate and Rhythm: Normal rate and regular rhythm.     Heart sounds: Normal heart sounds.  Pulmonary:     Effort: Pulmonary effort is normal. No respiratory distress.     Breath sounds: No wheezing.  Abdominal:     General: Bowel sounds are normal. There is no distension.     Palpations: Abdomen is soft.  Musculoskeletal:  General: No deformity. Normal range of motion.     Cervical back: Normal range of motion and neck supple.  Skin:    General: Skin is warm and dry.     Findings: No erythema or rash.  Neurological:     Mental Status: She is alert and oriented to person, place, and time. Mental status is at baseline.     Cranial Nerves: No cranial nerve deficit.     Coordination: Coordination normal.  Psychiatric:        Mood and Affect: Mood normal.      CMP Latest Ref Rng & Units 09/25/2020  Glucose 70 - 99 mg/dL 118(H)  BUN 8 - 23 mg/dL 12  Creatinine 0.44 - 1.00 mg/dL 0.84  Sodium 135 - 145 mmol/L 138  Potassium 3.5 - 5.1 mmol/L 3.6  Chloride 98 - 111 mmol/L 104  CO2 22 - 32 mmol/L 23  Calcium 8.9 - 10.3 mg/dL 8.4(L)  Total Protein 6.5 - 8.1 g/dL 7.0  Total Bilirubin 0.3 - 1.2 mg/dL 0.8  Alkaline Phos 38 - 126 U/L 78  AST 15 - 41 U/L 25  ALT 0 - 44 U/L 17   CBC Latest Ref Rng & Units 11/24/2020  WBC 3.4 - 10.8 x10E3/uL 4.7  Hemoglobin 11.1 - 15.9 g/dL 10.4(L)  Hematocrit 34.0 - 46.6 % 34.6  Platelets 150 - 450 x10E3/uL 207     LABORATORY DATA:  I have reviewed the data as listed Lab Results  Component Value Date   WBC 4.7 11/24/2020   HGB 10.4 (L) 11/24/2020   HCT  34.6 11/24/2020   MCV 69 (L) 11/24/2020   PLT 207 11/24/2020   Recent Labs    09/25/20 0553  NA 138  K 3.6  CL 104  CO2 23  GLUCOSE 118*  BUN 12  CREATININE 0.84  CALCIUM 8.4*  GFRNONAA >60  PROT 7.0  ALBUMIN 3.9  AST 25  ALT 17  ALKPHOS 78  BILITOT 0.8   Iron/TIBC/Ferritin/ %Sat    Component Value Date/Time   IRON 27 01/25/2020 0917   IRON 39 (L) 07/21/2012 1209   TIBC 467 (H) 01/25/2020 0917   TIBC 515 (H) 07/21/2012 1209   FERRITIN 9 (L) 01/25/2020 0845   IRONPCTSAT 6 (LL) 01/25/2020 0917   IRONPCTSAT 8 07/21/2012 1209     RADIOGRAPHIC STUDIES: I have personally reviewed the radiological images as listed and agreed with the findings in the report. No results found.     ASSESSMENT & PLAN:  1. Iron deficiency anemia, unspecified iron deficiency anemia type   2. Gastric nodule    No recent iron panel. Diet recommend to check CBC, CMP, iron TIBC ferritin. If confirmed to have iron deficiency anemia, recommend to proceed with IV Venofer treatments.  She does not tolerate oral iron supplementation Patient prefers to do labs at [facility], prescription given to patient. Discussed about potential side effects of IV Venofer treatments.   Allergy reactions/infusion reaction including anaphylactic reaction discussed with patient. Other side effects include but not limited to high blood pressure, skin rash, weight gain, leg swelling, etc. Patient voices understanding and willing to proceed.  Gastric nodule, etiology unknown.  GI plans [place] All questions were answered. The patient knows to call the clinic with any problems questions or concerns.  Cc Gatha Mayer, MD  Return of visit: To be determined, pending results from [facility]  thank you for this kind referral and the opportunity to participate in the care of this patient. A  copy of today's note is routed to referring provider   Earlie Server, MD, PhD Hematology Oncology [place] [facility] 01/26/2021

## 2021-01-26 NOTE — Assessment & Plan Note (Signed)
BP Readings from Last 3 Encounters:  01/26/21 114/78  12/25/20 (!) 97/43  12/01/20 116/84   Good control on valsartan/HCTZ

## 2021-01-26 NOTE — Addendum Note (Signed)
Addended by: Pilar Grammes on: 01/26/2021 11:00 AM   Modules accepted: Orders

## 2021-01-26 NOTE — Assessment & Plan Note (Signed)
Primary prevention with atorvastatin

## 2021-01-29 ENCOUNTER — Telehealth: Payer: Self-pay

## 2021-01-29 NOTE — Telephone Encounter (Signed)
MyChart message sent to patient notifying her that Dr. Tasia Catchings reviewed lab results and she would like for you to be scheduled for IV Venofer *new* weekly for 4 weeks.  As well as an appointment with her (virtual visit if possible) in 8 weeks with poss Venofer, labs prior (iron, cbc, ferr).  Asked pt if she wants labs drawn at [facility].

## 2021-01-30 NOTE — Telephone Encounter (Addendum)
Patient agrees with MD plan.  Please schedule her for weekly Venofer *new* x4 (not the next 2 weeks per pt request).  Schedule MD-virtual visit 4 weeks after last Venofer.  Patient will have labs drawn at [facility] (will mail lab orders to patient).

## 2021-01-31 ENCOUNTER — Encounter: Payer: Self-pay | Admitting: Oncology

## 2021-02-01 ENCOUNTER — Encounter: Payer: Self-pay | Admitting: Oncology

## 2021-02-08 NOTE — Progress Notes (Signed)
Attempted to obtain medical history via telephone, unable to reach at this time. I left a voicemail to return pre surgical testing department's phone call.  

## 2021-02-13 ENCOUNTER — Other Ambulatory Visit: Payer: Self-pay

## 2021-02-14 NOTE — Anesthesia Preprocedure Evaluation (Addendum)
Anesthesia Evaluation  Patient identified by MRN, date of birth, ID band Patient awake    Reviewed: Allergy & Precautions, NPO status , Patient's Chart, lab work & pertinent test results  History of Anesthesia Complications Negative for: history of anesthetic complications  Airway Mallampati: II  TM Distance: >3 FB Neck ROM: Full    Dental no notable dental hx.    Pulmonary asthma ,    Pulmonary exam normal        Cardiovascular hypertension, Pt. on medications Normal cardiovascular exam  TTE  10/2020: EF 60-65%, grade I DD, mild MR/AR   Neuro/Psych negative neurological ROS     GI/Hepatic Neg liver ROS, GERD  Controlled,Gastric lesion, Celiac dx   Endo/Other  Hypothyroidism (s/p thyroidectomy)   Renal/GU negative Renal ROS  negative genitourinary   Musculoskeletal negative musculoskeletal ROS (+)   Abdominal   Peds  Hematology   Anesthesia Other Findings Day of surgery medications reviewed with patient.  Reproductive/Obstetrics negative OB ROS                            Anesthesia Physical Anesthesia Plan  ASA: 2  Anesthesia Plan: MAC   Post-op Pain Management:    Induction:   PONV Risk Score and Plan: Treatment may vary due to age or medical condition and Propofol infusion  Airway Management Planned: Natural Airway and Nasal Cannula  Additional Equipment: None  Intra-op Plan:   Post-operative Plan:   Informed Consent: I have reviewed the patients History and Physical, chart, labs and discussed the procedure including the risks, benefits and alternatives for the proposed anesthesia with the patient or authorized representative who has indicated his/her understanding and acceptance.       Plan Discussed with: CRNA  Anesthesia Plan Comments:        Anesthesia Quick Evaluation

## 2021-02-15 ENCOUNTER — Ambulatory Visit (HOSPITAL_COMMUNITY): Payer: 59 | Admitting: Anesthesiology

## 2021-02-15 ENCOUNTER — Ambulatory Visit (HOSPITAL_COMMUNITY)
Admission: RE | Admit: 2021-02-15 | Discharge: 2021-02-15 | Disposition: A | Discharge status: Home / Self Care | Payer: 59 | Attending: Gastroenterology | Admitting: Gastroenterology

## 2021-02-15 ENCOUNTER — Encounter (HOSPITAL_COMMUNITY)
Admission: RE | Disposition: A | Discharge status: Home / Self Care | Payer: Self-pay | Source: Home / Self Care | Attending: Gastroenterology

## 2021-02-15 ENCOUNTER — Other Ambulatory Visit: Payer: Self-pay

## 2021-02-15 ENCOUNTER — Encounter (HOSPITAL_COMMUNITY): Payer: Self-pay | Admitting: Gastroenterology

## 2021-02-15 DIAGNOSIS — K449 Diaphragmatic hernia without obstruction or gangrene: Secondary | ICD-10-CM | POA: Diagnosis not present

## 2021-02-15 DIAGNOSIS — K3189 Other diseases of stomach and duodenum: Secondary | ICD-10-CM

## 2021-02-15 DIAGNOSIS — K319 Disease of stomach and duodenum, unspecified: Secondary | ICD-10-CM

## 2021-02-15 DIAGNOSIS — Z8616 Personal history of COVID-19: Secondary | ICD-10-CM | POA: Diagnosis not present

## 2021-02-15 DIAGNOSIS — K219 Gastro-esophageal reflux disease without esophagitis: Secondary | ICD-10-CM | POA: Diagnosis not present

## 2021-02-15 DIAGNOSIS — Z8249 Family history of ischemic heart disease and other diseases of the circulatory system: Secondary | ICD-10-CM | POA: Insufficient documentation

## 2021-02-15 DIAGNOSIS — D371 Neoplasm of uncertain behavior of stomach: Secondary | ICD-10-CM | POA: Diagnosis not present

## 2021-02-15 DIAGNOSIS — Z885 Allergy status to narcotic agent status: Secondary | ICD-10-CM | POA: Insufficient documentation

## 2021-02-15 HISTORY — PX: FINE NEEDLE ASPIRATION: SHX5430

## 2021-02-15 HISTORY — PX: ESOPHAGOGASTRODUODENOSCOPY: SHX5428

## 2021-02-15 HISTORY — PX: EUS: SHX5427

## 2021-02-15 SURGERY — UPPER ENDOSCOPIC ULTRASOUND (EUS) RADIAL
Anesthesia: Monitor Anesthesia Care

## 2021-02-15 MED ORDER — LIDOCAINE HCL (CARDIAC) PF 100 MG/5ML IV SOSY
PREFILLED_SYRINGE | INTRAVENOUS | Status: DC | PRN
  Administered 2021-02-15: 75 mg via INTRAVENOUS

## 2021-02-15 MED ORDER — PROMETHAZINE HCL 25 MG/ML IJ SOLN: 6.2500 mg | INTRAMUSCULAR | Status: DC | PRN

## 2021-02-15 MED ORDER — PROPOFOL 500 MG/50ML IV EMUL
INTRAVENOUS | Status: DC | PRN
  Administered 2021-02-15: 275 ug/kg/min via INTRAVENOUS

## 2021-02-15 MED ORDER — PROPOFOL 1000 MG/100ML IV EMUL
INTRAVENOUS | Status: AC
  Filled 2021-02-15: qty 100

## 2021-02-15 MED ORDER — SODIUM CHLORIDE 0.9 % IV SOLN: INTRAVENOUS | Status: DC

## 2021-02-15 MED ORDER — LACTATED RINGERS IV SOLN: Freq: Once | INTRAVENOUS | Status: AC

## 2021-02-15 MED ORDER — LACTATED RINGERS IV SOLN: INTRAVENOUS | Status: DC | PRN

## 2021-02-15 MED ORDER — GLYCOPYRROLATE 0.2 MG/ML IJ SOLN
INTRAMUSCULAR | Status: DC | PRN
  Administered 2021-02-15: .1 mg via INTRAVENOUS

## 2021-02-15 NOTE — Op Note (Signed)
[facility] Patient Name: Sharon Shepherd Procedure Date: 02/15/2021 MRN: [id number] Attending MD: Milus Banister , MD Date of Birth: [birth year] CSN: [id number] Age: 70 Admit Type: Outpatient Procedure:                Upper EUS Indications:              Gastric subepithelial nodule found of recent EGD                            (Dr. Carlean Purl) Providers:                Milus Banister, MD, Clyde Lundborg, RN, Hinton Dyer, Laverda Sorenson, Technician, Enrigue Catena, CRNA Referring MD:             Silvano Rusk, MD Medicines:                Monitored Anesthesia Care Complications:            No immediate complications. Estimated blood loss:                            None. Estimated Blood Loss:     Estimated blood loss: none. Procedure:                Pre-Anesthesia Assessment:                           - Prior to the procedure, a History and Physical                            was performed, and patient medications and                            allergies were reviewed. The patient's tolerance of                            previous anesthesia was also reviewed. The risks                            and benefits of the procedure and the sedation                            options and risks were discussed with the patient.                            All questions were answered, and informed consent                            was obtained. Prior Anticoagulants: The patient has                            taken no previous anticoagulant or antiplatelet  agents. ASA Grade Assessment: II - A patient with                            mild systemic disease. After reviewing the risks                            and benefits, the patient was deemed in                            satisfactory condition to undergo the procedure.                           After obtaining informed consent, the endoscope was                            passed under  direct vision. Throughout the                            procedure, the patient's blood pressure, pulse, and                            oxygen saturations were monitored continuously. The                            [id number]-[id number] ([id number]) Olympus Radial EUS was                            introduced through the mouth, and advanced to the                            second part of duodenum. The [id number] ([id number])                            Olympus Linear EUS was introduced through the                            mouth, and advanced to the second part of duodenum.                            The upper EUS was accomplished without difficulty.                            The patient tolerated the procedure well. Scope In: Scope Out: Findings:      ENDOSCOPIC FINDING (limited views with radial and linear EUS scopes): :      Normal esophagus.      Medium sized hiatal hernia.      Small amount of retained solid food in the stomach.      Small (approximately 1cm) subepithelial lesion along the posterior wall       of the gastric body, normal overlying mucosa.      Normal duodenum.      ENDOSONOGRAPHIC FINDING: :      1. The gastric subepithelial lesion described above correlated with an       oval intramural (subepithelial).  The lesion was hypoechoic, calcified       and shadowing. The lesion appears to communicate with the muscularis       propria layer and it measured 1.6cm across. The outer endosonographic       borders were well defined. Fine needle aspiration for cytology was       performed. Color Doppler imaging was utilized prior to needle puncture       to confirm a lack of significant vascular structures within the needle       path. Two passes were made with the 22 gauge needle using a transgastric       approach. A cytotechnologist was present to evaluate the adequacy of the       specimen. Final cytology results are pending.      2. The gastric wall was otherwise normal.      3. No  perigastric adenopathy.      4. Limited views of the pancreas, CBD, liver were all normal Impression:               Medium sized hiatal hernia.                           Small amount of retained solid food in the stomach.                           2.1YY, calicified subepithelial lesion which                            communicated with the muscularis propria layer of                            the posterior gastric wall. This is most likely a                            calcified GIST, FNA performed. At this size (<2cm)                            this would be safe for surveillance endoscopy.                            Await final cytology results. Moderate Sedation:      Not Applicable - Patient had care per Anesthesia. Recommendation:           - Discharge patient to home. Procedure Code(s):        --- Professional ---                           [phone], Esophagogastroduodenoscopy, flexible,                            transoral; with transendoscopic ultrasound-guided                            intramural or transmural fine needle                            aspiration/biopsy(s), (includes endoscopic  ultrasound examination limited to the esophagus,                            stomach or duodenum, and adjacent structures) Diagnosis Code(s):        --- Professional ---                           K31.89, Other diseases of stomach and duodenum CPT copyright 2019 American Medical Association. All rights reserved. The codes documented in this report are preliminary and upon coder review may  be revised to meet current compliance requirements. Milus Banister, MD 02/15/2021 9:26:18 AM This report has been signed electronically. Number of Addenda: 0

## 2021-02-15 NOTE — Anesthesia Procedure Notes (Signed)
Procedure Name: MAC Date/Time: 02/15/2021 8:26 AM Performed by: Lissa Morales, CRNA Pre-anesthesia Checklist: Patient identified, Emergency Drugs available, Suction available, Patient being monitored and Timeout performed Patient Re-evaluated:Patient Re-evaluated prior to induction Oxygen Delivery Method: Simple face mask Preoxygenation: Pre-oxygenation with 100% oxygen (POM mask) Placement Confirmation: positive ETCO2

## 2021-02-15 NOTE — Anesthesia Postprocedure Evaluation (Signed)
Anesthesia Post Note  Patient: Sharon Shepherd  Procedure(s) Performed: UPPER ENDOSCOPIC ULTRASOUND (EUS) RADIAL FINE NEEDLE ASPIRATION (FNA) LINEAR ESOPHAGOGASTRODUODENOSCOPY (EGD)     Patient location during evaluation: PACU Anesthesia Type: MAC Level of consciousness: awake and alert and oriented Pain management: pain level controlled Vital Signs Assessment: post-procedure vital signs reviewed and stable Respiratory status: spontaneous breathing, nonlabored ventilation and respiratory function stable Cardiovascular status: blood pressure returned to baseline Postop Assessment: no apparent nausea or vomiting Anesthetic complications: no   No notable events documented.  Last Vitals:  Vitals:   02/15/21 0905 02/15/21 0915  BP: (!) 80/39 (!) 108/54  Pulse:  78  Resp: 15 20  Temp: 36.6 C   SpO2: 100% 98%    Last Pain:  Vitals:   02/15/21 0915  TempSrc:   PainSc: 0-No pain                 Brennan Bailey

## 2021-02-15 NOTE — Transfer of Care (Signed)
Immediate Anesthesia Transfer of Care Note  Patient: Bufford Lope [place]ccauley  Procedure(s) Performed: UPPER ENDOSCOPIC ULTRASOUND (EUS) RADIAL FINE NEEDLE ASPIRATION (FNA) LINEAR ESOPHAGOGASTRODUODENOSCOPY (EGD)  Patient Location: PACU  Anesthesia Type:MAC  Level of Consciousness: sedated  Airway & Oxygen Therapy: Patient Spontanous Breathing and Patient connected to face mask oxygen  Post-op Assessment: Report given to RN and Post -op Vital signs reviewed and stable  Post vital signs: stable  Last Vitals:  Vitals Value Taken Time  BP 80/39 02/15/21 0905  Temp 36.6 C 02/15/21 0905  Pulse    Resp 15 02/15/21 0905  SpO2 100 % 02/15/21 0905    Last Pain:  Vitals:   02/15/21 0905  TempSrc: Axillary  PainSc: 0-No pain         Complications: No notable events documented.

## 2021-02-15 NOTE — H&P (Signed)
  HPI: This is a woman with subepith lesion noted on recent EGD  ROS: complete GI ROS as described in HPI, all other review negative.  Constitutional:  No unintentional weight loss   Past Medical History:  Diagnosis Date   Allergy    Anemia    Celiac disease    COVID 09/25/2020   Diverticulitis    GERD (gastroesophageal reflux disease)    Hyperlipidemia    Hypertension    Sleep disturbance    Thyroiditis, lymphocytic    surgery 2013   Vitamin B12 deficiency    unresponsive to oral replacement    Past Surgical History:  Procedure Laterality Date   COLONOSCOPY     ESOPHAGOGASTRODUODENOSCOPY  03/04   colon   Laryngeal implant  2/13   after damage during thyroidectomy   THYROIDECTOMY  12/12   Lymphocytic thyroiditis with goiter--Dr Rochel Brome    No current facility-administered medications for this encounter.    Allergies as of 01/10/2021 - Review Complete 12/25/2020  Allergen Reaction Noted   Codeine  10/22/2007    Family History  Problem Relation Age of Onset   Other Father        pancreatic absess   Heart attack Mother    Colon cancer Neg Hx    Breast cancer Neg Hx    Esophageal cancer Neg Hx    Stomach cancer Neg Hx    Rectal cancer Neg Hx     Social History   Socioeconomic History   Marital status: Married    Spouse name: Not on file   Number of children: 2   Years of education: Not on file   Highest education level: Not on file  Occupational History   Occupation: Building control surveyor: [employer]  Tobacco Use   Smoking status: Never   Smokeless tobacco: Never  Vaping Use   Vaping Use: Never used  Substance and Sexual Activity   Alcohol use: Yes    Comment: rare   Drug use: No   Sexual activity: Not on file  Other Topics Concern   Not on file  Social History Narrative   The patient is married and has 2 children.   She is employed in Librarian, academic at [employer]   Rare alcohol and never tobacco   Social Determinants of Adult nurse Strain: Not on file  Food Insecurity: Not on file  Transportation Needs: Not on file  Physical Activity: Not on file  Stress: Not on file  Social Connections: Not on file  Intimate Partner Violence: Not on file     Physical Exam: Ht 5' 6"  (1.676 m)   Wt 89.8 kg   BMI 31.96 kg/m  Constitutional: generally well-appearing Psychiatric: alert and oriented x3 Abdomen: soft, nontender, nondistended, no obvious ascites, no peritoneal signs, normal bowel sounds No peripheral edema noted in lower extremities  Assessment and plan: 70 y.o. female with gastric subepithelial lesion on recent EGD  For EUS evaluation today.  Please see the "Patient Instructions" section for addition details about the plan.  Owens Loffler, MD [facility] 02/15/2021, 7:19 AM

## 2021-02-15 NOTE — Discharge Instructions (Signed)
Please call MD office or Sharon Shepherd for any concerns or questions. Thank you for letting us be here with you!

## 2021-02-20 ENCOUNTER — Telehealth: Payer: Self-pay | Admitting: Gastroenterology

## 2021-02-20 LAB — CYTOLOGY - NON PAP

## 2021-02-20 NOTE — Telephone Encounter (Signed)
Inbound call from patient requesting a call back about results. Best contact number [phone]

## 2021-02-20 NOTE — Telephone Encounter (Signed)
Dr Ardis Hughs the pt is calling for path results from 7/7.  Have you reviewed?

## 2021-02-21 ENCOUNTER — Other Ambulatory Visit: Payer: Self-pay

## 2021-02-21 ENCOUNTER — Telehealth: Payer: Self-pay

## 2021-02-21 ENCOUNTER — Inpatient Hospital Stay: Payer: 59 | Attending: Oncology

## 2021-02-21 VITALS — BP 124/58 | HR 90 | Resp 20

## 2021-02-21 DIAGNOSIS — D508 Other iron deficiency anemias: Secondary | ICD-10-CM

## 2021-02-21 DIAGNOSIS — C49A0 Gastrointestinal stromal tumor, unspecified site: Secondary | ICD-10-CM

## 2021-02-21 DIAGNOSIS — D509 Iron deficiency anemia, unspecified: Secondary | ICD-10-CM | POA: Insufficient documentation

## 2021-02-21 MED ORDER — IRON SUCROSE 20 MG/ML IV SOLN
200.0000 mg | Freq: Once | INTRAVENOUS | Status: AC
  Administered 2021-02-21: 200 mg via INTRAVENOUS
  Filled 2021-02-21: qty 10

## 2021-02-21 MED ORDER — SODIUM CHLORIDE 0.9 % IV SOLN
Freq: Once | INTRAVENOUS | Status: AC
  Filled 2021-02-21: qty 250

## 2021-02-21 MED ORDER — SODIUM CHLORIDE 0.9 % IV SOLN: 200.0000 mg | Freq: Once | INTRAVENOUS | Status: DC

## 2021-02-21 NOTE — Telephone Encounter (Signed)
Referral made to Medical Oncology.

## 2021-02-21 NOTE — Telephone Encounter (Signed)
I spoke with her about the biopsy results.  Please see pathology results note.

## 2021-02-21 NOTE — Telephone Encounter (Signed)
-----   Message from Milus Banister, MD sent at 02/21/2021  7:31 AM EDT ----- Denice Paradise, Thanks.  Yes I had already seen this.  I spoke with her about it this morning.  See below  Glendell Docker, I discussed the biopsy results with her.  As expected this is a "very small" GIST by NCCN criteria.  I recommended repeat endoscopic ultrasound in 1 year for surveillance to see if it shows significant change.  She would like to sit down with an oncologist to get their opinion on it.  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  She sees a hematologist for iron deficiency anemia already, Dr. Tasia Catchings.  I am not sure if they comfortable advising about a GIST.  Please refer to the cancer center and see if they can help put her with the correct oncologist.  Thanks

## 2021-02-21 NOTE — Patient Instructions (Signed)
[facility]COL[facility]  Discharge Instructions: Thank you for choosing [facility] to provide your oncology and hematology care.  If you have a lab appointment with the [facility], please go directly to the [facility] and check in at the registration area.  Wear comfortable clothing and clothing appropriate for easy access to any Portacath or PICC line.   We strive to give you quality time with your provider. You may need to reschedule your appointment if you arrive late (15 or more minutes).  Arriving late affects you and other patients whose appointments are after yours.  Also, if you miss three or more appointments without notifying the office, you may be dismissed from the clinic at the provider's discretion.      For prescription refill requests, have your pharmacy contact our office and allow 72 hours for refills to be completed.    Today you received the following Medication, Venofer   To help prevent nausea and vomiting after your treatment, we encourage you to take your nausea medication as directed.  BELOW ARE SYMPTOMS THAT SHOULD BE REPORTED IMMEDIATELY: *FEVER GREATER THAN 100.4 F (38 C) OR HIGHER *CHILLS OR SWEATING *NAUSEA AND VOMITING THAT IS NOT CONTROLLED WITH YOUR NAUSEA MEDICATION *UNUSUAL SHORTNESS OF BREATH *UNUSUAL BRUISING OR BLEEDING *URINARY PROBLEMS (pain or burning when urinating, or frequent urination) *BOWEL PROBLEMS (unusual diarrhea, constipation, pain near the anus) TENDERNESS IN MOUTH AND THROAT WITH OR WITHOUT PRESENCE OF ULCERS (sore throat, sores in mouth, or a toothache) UNUSUAL RASH, SWELLING OR PAIN  UNUSUAL VAGINAL DISCHARGE OR ITCHING   Items with * indicate a potential emergency and should be followed up as soon as possible or go to the Emergency Department if any problems should occur.  Please show the CHEMOTHERAPY ALERT CARD or IMMUNOTHERAPY ALERT CARD at check-in to the Emergency Department and  triage nurse.  Should you have questions after your visit or need to cancel or reschedule your appointment, please contact [facility]  [phone] and follow the prompts.  Office hours are 8:00 a.m. to 4:30 p.m. Monday - Friday. Please note that voicemails left after 4:00 p.m. may not be returned until the following business day.  We are closed weekends and major holidays. You have access to a nurse at all times for urgent questions. Please call the main number to the clinic [phone] and follow the prompts.  For any non-urgent questions, you may also contact your provider using MyChart. We now offer e-Visits for anyone 18 and older to request care online for non-urgent symptoms. For details visit [email or web address].   Also download the MyChart app! Go to the app store, search "MyChart", open the app, select [facility], and log in with your MyChart username and password.  Due to Covid, a mask is required upon entering the hospital/clinic. If you do not have a mask, one will be given to you upon arrival. For doctor visits, patients may have 1 support person aged 38 or older with them. For treatment visits, patients cannot have anyone with them due to current Covid guidelines and our immunocompromised population. Iron Sucrose injection What is this medication? IRON SUCROSE (AHY ern SOO krohs) is an iron complex. Iron is used to make healthy red blood cells, which carry oxygen and nutrients throughout the body. This medicine is used to treat iron deficiency anemia in people with chronickidney disease. This medicine may be used for other purposes; ask your health care provider orpharmacist if  you have questions. COMMON BRAND NAME(S): Venofer What should I tell my care team before I take this medication? They need to know if you have any of these conditions: anemia not caused by low iron levels heart disease high levels of iron in the blood kidney  disease liver disease an unusual or allergic reaction to iron, other medicines, foods, dyes, or preservatives pregnant or trying to get pregnant breast-feeding How should I use this medication? This medicine is for infusion into a vein. It is given by a health careprofessional in a hospital or clinic setting. Talk to your pediatrician regarding the use of this medicine in children. While this drug may be prescribed for children as young as 2 years for selectedconditions, precautions do apply. Overdosage: If you think you have taken too much of this medicine contact apoison control center or emergency room at once. NOTE: This medicine is only for you. Do not share this medicine with others. What if I miss a dose? It is important not to miss your dose. Call your doctor or health careprofessional if you are unable to keep an appointment. What may interact with this medication? Do not take this medicine with any of the following medications: deferoxamine dimercaprol other iron products This medicine may also interact with the following medications: chloramphenicol deferasirox This list may not describe all possible interactions. Give your health care provider a list of all the medicines, herbs, non-prescription drugs, or dietary supplements you use. Also tell them if you smoke, drink alcohol, or use illegaldrugs. Some items may interact with your medicine. What should I watch for while using this medication? Visit your doctor or healthcare professional regularly. Tell your doctor or healthcare professional if your symptoms do not start to get better or if theyget worse. You may need blood work done while you are taking this medicine. You may need to follow a special diet. Talk to your doctor. Foods that contain iron include: whole grains/cereals, dried fruits, beans, or peas, leafy greenvegetables, and organ meats (liver, kidney). What side effects may I notice from receiving this  medication? Side effects that you should report to your doctor or health care professionalas soon as possible: allergic reactions like skin rash, itching or hives, swelling of the face, lips, or tongue breathing problems changes in blood pressure cough fast, irregular heartbeat feeling faint or lightheaded, falls fever or chills flushing, sweating, or hot feelings joint or muscle aches/pains seizures swelling of the ankles or feet unusually weak or tired Side effects that usually do not require medical attention (report to yourdoctor or health care professional if they continue or are bothersome): diarrhea feeling achy headache irritation at site where injected nausea, vomiting stomach upset tiredness This list may not describe all possible side effects. Call your doctor for medical advice about side effects. You may report side effects to FDA at1-800-FDA-1088. Where should I keep my medication? This drug is given in a hospital or clinic and will not be stored at home. NOTE: This sheet is a summary. It may not cover all possible information. If you have questions about this medicine, talk to your doctor, pharmacist, orhealth care provider.  2022 Elsevier/Gold Standard (2011-05-09 17:14:35)

## 2021-02-28 ENCOUNTER — Inpatient Hospital Stay: Payer: 59

## 2021-02-28 VITALS — BP 132/84 | HR 92 | Temp 97.4°F

## 2021-02-28 DIAGNOSIS — D508 Other iron deficiency anemias: Secondary | ICD-10-CM

## 2021-02-28 DIAGNOSIS — D509 Iron deficiency anemia, unspecified: Secondary | ICD-10-CM | POA: Diagnosis not present

## 2021-02-28 MED ORDER — IRON SUCROSE 20 MG/ML IV SOLN
200.0000 mg | Freq: Once | INTRAVENOUS | Status: AC
  Administered 2021-02-28: 200 mg via INTRAVENOUS
  Filled 2021-02-28: qty 10

## 2021-02-28 MED ORDER — SODIUM CHLORIDE 0.9 % IV SOLN: 200.0000 mg | Freq: Once | INTRAVENOUS | Status: DC

## 2021-02-28 MED ORDER — SODIUM CHLORIDE 0.9 % IV SOLN
Freq: Once | INTRAVENOUS | Status: AC
  Filled 2021-02-28: qty 250

## 2021-03-07 ENCOUNTER — Inpatient Hospital Stay: Payer: 59

## 2021-03-07 VITALS — BP 147/73 | HR 64 | Temp 97.0°F | Resp 18

## 2021-03-07 DIAGNOSIS — D508 Other iron deficiency anemias: Secondary | ICD-10-CM

## 2021-03-07 DIAGNOSIS — D509 Iron deficiency anemia, unspecified: Secondary | ICD-10-CM | POA: Diagnosis not present

## 2021-03-07 MED ORDER — SODIUM CHLORIDE 0.9 % IV SOLN
Freq: Once | INTRAVENOUS | Status: AC
  Filled 2021-03-07: qty 250

## 2021-03-07 MED ORDER — IRON SUCROSE 20 MG/ML IV SOLN
200.0000 mg | Freq: Once | INTRAVENOUS | Status: AC
  Administered 2021-03-07: 200 mg via INTRAVENOUS
  Filled 2021-03-07: qty 10

## 2021-03-07 MED ORDER — SODIUM CHLORIDE 0.9 % IV SOLN: 200.0000 mg | Freq: Once | INTRAVENOUS | Status: DC

## 2021-03-07 NOTE — Patient Instructions (Signed)
[facility] ONCOLOGY  Discharge Instructions: Thank you for choosing [facility] to provide your oncology and hematology care.  If you have a lab appointment with the [facility], please go directly to the [facility] and check in at the registration area.  Wear comfortable clothing and clothing appropriate for easy access to any Portacath or PICC line.   We strive to give you quality time with your provider. You may need to reschedule your appointment if you arrive late (15 or more minutes).  Arriving late affects you and other patients whose appointments are after yours.  Also, if you miss three or more appointments without notifying the office, you may be dismissed from the clinic at the provider's discretion.      For prescription refill requests, have your pharmacy contact our office and allow 72 hours for refills to be completed.    Today you received the following chemotherapy and/or immunotherapy agents venofer       To help prevent nausea and vomiting after your treatment, we encourage you to take your nausea medication as directed.  BELOW ARE SYMPTOMS THAT SHOULD BE REPORTED IMMEDIATELY: *FEVER GREATER THAN 100.4 F (38 C) OR HIGHER *CHILLS OR SWEATING *NAUSEA AND VOMITING THAT IS NOT CONTROLLED WITH YOUR NAUSEA MEDICATION *UNUSUAL SHORTNESS OF BREATH *UNUSUAL BRUISING OR BLEEDING *URINARY PROBLEMS (pain or burning when urinating, or frequent urination) *BOWEL PROBLEMS (unusual diarrhea, constipation, pain near the anus) TENDERNESS IN MOUTH AND THROAT WITH OR WITHOUT PRESENCE OF ULCERS (sore throat, sores in mouth, or a toothache) UNUSUAL RASH, SWELLING OR PAIN  UNUSUAL VAGINAL DISCHARGE OR ITCHING   Items with * indicate a potential emergency and should be followed up as soon as possible or go to the Emergency Department if any problems should occur.  Please show the CHEMOTHERAPY ALERT CARD or IMMUNOTHERAPY ALERT CARD at check-in  to the Emergency Department and triage nurse.  Should you have questions after your visit or need to cancel or reschedule your appointment, please contact [facility]  [phone] and follow the prompts.  Office hours are 8:00 a.m. to 4:30 p.m. Monday - Friday. Please note that voicemails left after 4:00 p.m. may not be returned until the following business day.  We are closed weekends and major holidays. You have access to a nurse at all times for urgent questions. Please call the main number to the clinic [phone] and follow the prompts.  For any non-urgent questions, you may also contact your provider using MyChart. We now offer e-Visits for anyone 3 and older to request care online for non-urgent symptoms. For details visit [email or web address].   Also download the MyChart app! Go to the app store, search "MyChart", open the app, select [facility], and log in with your MyChart username and password.  Due to Covid, a mask is required upon entering the hospital/clinic. If you do not have a mask, one will be given to you upon arrival. For doctor visits, patients may have 1 support person aged 18 or older with them. For treatment visits, patients cannot have anyone with them due to current Covid guidelines and our immunocompromised population.   Iron Sucrose injection What is this medication? IRON SUCROSE (AHY ern SOO krohs) is an iron complex. Iron is used to make healthy red blood cells, which carry oxygen and nutrients throughout the body. This medicine is used to treat iron deficiency anemia in people with chronickidney disease. This medicine may be used for  other purposes; ask your health care provider orpharmacist if you have questions. COMMON BRAND NAME(S): Venofer What should I tell my care team before I take this medication? They need to know if you have any of these conditions: anemia not caused by low iron levels heart disease high levels of  iron in the blood kidney disease liver disease an unusual or allergic reaction to iron, other medicines, foods, dyes, or preservatives pregnant or trying to get pregnant breast-feeding How should I use this medication? This medicine is for infusion into a vein. It is given by a health careprofessional in a hospital or clinic setting. Talk to your pediatrician regarding the use of this medicine in children. While this drug may be prescribed for children as young as 2 years for selectedconditions, precautions do apply. Overdosage: If you think you have taken too much of this medicine contact apoison control center or emergency room at once. NOTE: This medicine is only for you. Do not share this medicine with others. What if I miss a dose? It is important not to miss your dose. Call your doctor or health careprofessional if you are unable to keep an appointment. What may interact with this medication? Do not take this medicine with any of the following medications: deferoxamine dimercaprol other iron products This medicine may also interact with the following medications: chloramphenicol deferasirox This list may not describe all possible interactions. Give your health care provider a list of all the medicines, herbs, non-prescription drugs, or dietary supplements you use. Also tell them if you smoke, drink alcohol, or use illegaldrugs. Some items may interact with your medicine. What should I watch for while using this medication? Visit your doctor or healthcare professional regularly. Tell your doctor or healthcare professional if your symptoms do not start to get better or if theyget worse. You may need blood work done while you are taking this medicine. You may need to follow a special diet. Talk to your doctor. Foods that contain iron include: whole grains/cereals, dried fruits, beans, or peas, leafy greenvegetables, and organ meats (liver, kidney). What side effects may I notice from  receiving this medication? Side effects that you should report to your doctor or health care professionalas soon as possible: allergic reactions like skin rash, itching or hives, swelling of the face, lips, or tongue breathing problems changes in blood pressure cough fast, irregular heartbeat feeling faint or lightheaded, falls fever or chills flushing, sweating, or hot feelings joint or muscle aches/pains seizures swelling of the ankles or feet unusually weak or tired Side effects that usually do not require medical attention (report to yourdoctor or health care professional if they continue or are bothersome): diarrhea feeling achy headache irritation at site where injected nausea, vomiting stomach upset tiredness This list may not describe all possible side effects. Call your doctor for medical advice about side effects. You may report side effects to FDA at1-800-FDA-1088. Where should I keep my medication? This drug is given in a hospital or clinic and will not be stored at home. NOTE: This sheet is a summary. It may not cover all possible information. If you have questions about this medicine, talk to your doctor, pharmacist, orhealth care provider.  2022 Elsevier/Gold Standard (2011-05-09 17:14:35)

## 2021-03-14 ENCOUNTER — Inpatient Hospital Stay: Payer: 59 | Attending: Oncology

## 2021-03-14 DIAGNOSIS — D509 Iron deficiency anemia, unspecified: Secondary | ICD-10-CM | POA: Diagnosis present

## 2021-03-14 DIAGNOSIS — D508 Other iron deficiency anemias: Secondary | ICD-10-CM

## 2021-03-14 MED ORDER — SODIUM CHLORIDE 0.9 % IV SOLN: 200.0000 mg | Freq: Once | INTRAVENOUS | Status: DC

## 2021-03-14 MED ORDER — SODIUM CHLORIDE 0.9 % IV SOLN
Freq: Once | INTRAVENOUS | Status: AC
  Filled 2021-03-14: qty 250

## 2021-03-14 MED ORDER — IRON SUCROSE 20 MG/ML IV SOLN
200.0000 mg | Freq: Once | INTRAVENOUS | Status: AC
  Administered 2021-03-14: 200 mg via INTRAVENOUS
  Filled 2021-03-14: qty 10

## 2021-03-14 NOTE — Patient Instructions (Signed)
[facility]  Discharge Instructions: Thank you for choosing [facility] to provide your oncology and hematology care.  If you have a lab appointment with the Cancer Center, please go directly to the Cancer Center and check in at the registration area.  Wear comfortable clothing and clothing appropriate for easy access to any Portacath or PICC line.   We strive to give you quality time with your provider. You may need to reschedule your appointment if you arrive late (15 or more minutes).  Arriving late affects you and other patients whose appointments are after yours.  Also, if you miss three or more appointments without notifying the office, you may be dismissed from the clinic at the provider's discretion.      For prescription refill requests, have your pharmacy contact our office and allow 72 hours for refills to be completed.    Today you received the following : Venofer   To help prevent nausea and vomiting after your treatment, we encourage you to take your nausea medication as directed.  BELOW ARE SYMPTOMS THAT SHOULD BE REPORTED IMMEDIATELY: . *FEVER GREATER THAN 100.4 F (38 C) OR HIGHER . *CHILLS OR SWEATING . *NAUSEA AND VOMITING THAT IS NOT CONTROLLED WITH YOUR NAUSEA MEDICATION . *UNUSUAL SHORTNESS OF BREATH . *UNUSUAL BRUISING OR BLEEDING . *URINARY PROBLEMS (pain or burning when urinating, or frequent urination) . *BOWEL PROBLEMS (unusual diarrhea, constipation, pain near the anus) . TENDERNESS IN MOUTH AND THROAT WITH OR WITHOUT PRESENCE OF ULCERS (sore throat, sores in mouth, or a toothache) . UNUSUAL RASH, SWELLING OR PAIN  . UNUSUAL VAGINAL DISCHARGE OR ITCHING   Items with * indicate a potential emergency and should be followed up as soon as possible or go to the Emergency Department if any problems should occur.  Please show the CHEMOTHERAPY ALERT CARD or IMMUNOTHERAPY ALERT CARD at check-in to the Emergency  Department and triage nurse.  Should you have questions after your visit or need to cancel or reschedule your appointment, please contact [facility]  [phone] and follow the prompts.  Office hours are 8:00 a.m. to 4:30 p.m. Monday - Friday. Please note that voicemails left after 4:00 p.m. may not be returned until the following business day.  We are closed weekends and major holidays. You have access to a nurse at all times for urgent questions. Please call the main number to the clinic [phone] and follow the prompts.  For any non-urgent questions, you may also contact your provider using MyChart. We now offer e-Visits for anyone 18 and older to request care online for non-urgent symptoms. For details visit [email or web address].   Also download the MyChart app! Go to the app store, search "MyChart", open the app, select [facility], and log in with your MyChart username and password.  Due to Covid, a mask is required upon entering the hospital/clinic. If you do not have a mask, one will be given to you upon arrival. For doctor visits, patients may have 1 support person aged 18 or older with them. For treatment visits, patients cannot have anyone with them due to current Covid guidelines and our immunocompromised population.  

## 2021-03-15 ENCOUNTER — Other Ambulatory Visit: Payer: Self-pay | Admitting: Internal Medicine

## 2021-03-19 ENCOUNTER — Telehealth: Payer: Self-pay | Admitting: Internal Medicine

## 2021-03-19 DIAGNOSIS — C49A2 Gastrointestinal stromal tumor of stomach: Secondary | ICD-10-CM | POA: Insufficient documentation

## 2021-03-19 DIAGNOSIS — D214 Benign neoplasm of connective and other soft tissue of abdomen: Secondary | ICD-10-CM | POA: Insufficient documentation

## 2021-03-19 NOTE — Telephone Encounter (Signed)
Sharon Shepherd, I have placed a referral for Dr. Benay Spice for evaluation of a GIST tumor. Dr. Carlean Purl would like her to see Dr. Benay Spice.  She is established with hematology in [place]. Can she be scheduled with Dr. Benay Spice as well?

## 2021-03-19 NOTE — Telephone Encounter (Signed)
I called the patient.  She has a fair amount of anxiety related to this small less than 2 cm GIST tumor.  I explained that it is typically benign at this point and may never need treatment.  However, she has some concerns, we have decided to refer her to Dr. Benay Spice of medical oncology here in [place] rather than to go to [facility].  She is already seeing a hematologist at [facility] for iron infusions which she may keep but I need her to see Dr. Benay Spice.  Please let her know of the status of the referral and how that works once you initiated.

## 2021-03-20 NOTE — Telephone Encounter (Signed)
Patient has been scheduled with Dr. Benay Spice for 8/12

## 2021-03-21 ENCOUNTER — Telehealth: Payer: Self-pay | Admitting: Oncology

## 2021-03-21 ENCOUNTER — Telehealth: Payer: Self-pay

## 2021-03-21 NOTE — Telephone Encounter (Signed)
-----   Message from Earlie Server, MD sent at 03/21/2021  8:17 AM EDT ----- Regarding: RE: Referral Thank you for letting me know.  Benjamine Mola, see below, please cancel her follow up appointment with me. I don't think patient needs to see two [facility].   Dr.Gessner,patient established with me previously for iron deficiency anemia, and also was seen by me for GIST. Just FYI, I do see both Hematology and Oncology patients.  ----- Message ----- From: Tania Ade, RN Sent: 03/20/2021   7:57 AM EDT To: Earlie Server, MD Subject: Referral                                       Good Morning Dr. Tasia Catchings, As a courtesy, I wanted to make you aware that Dr. Carlean Purl referred this young lady to Dr. Benay Spice for new diagnosis of low grade GIST. I confirmed w/patient that she did wish to see Dr. Benay Spice regarding this diagnosis since Dr. Carlean Purl recommended him.  Thank you, Merceda Elks, RN [facility]

## 2021-03-21 NOTE — Telephone Encounter (Signed)
Please cancel follow up appt on 8/24. Thanks

## 2021-03-22 ENCOUNTER — Encounter: Payer: Self-pay | Admitting: Oncology

## 2021-03-22 ENCOUNTER — Telehealth: Payer: Self-pay | Admitting: *Deleted

## 2021-03-22 NOTE — Telephone Encounter (Signed)
Pt informed of appt cancellation .

## 2021-03-22 NOTE — Telephone Encounter (Signed)
Called patient to confirm her new patient appointment tomorrow at 2:30 with 2:15 pm arrival time. Informed of office mask and visitor policy and parking and what will occur at visit.

## 2021-03-23 ENCOUNTER — Inpatient Hospital Stay: Payer: 59 | Attending: Oncology | Admitting: Oncology

## 2021-03-23 ENCOUNTER — Other Ambulatory Visit: Payer: Self-pay

## 2021-03-23 VITALS — BP 145/89 | HR 73 | Temp 97.8°F | Resp 20 | Ht 66.0 in | Wt 201.6 lb

## 2021-03-23 DIAGNOSIS — Z79899 Other long term (current) drug therapy: Secondary | ICD-10-CM | POA: Insufficient documentation

## 2021-03-23 DIAGNOSIS — D509 Iron deficiency anemia, unspecified: Secondary | ICD-10-CM | POA: Diagnosis not present

## 2021-03-23 DIAGNOSIS — I10 Essential (primary) hypertension: Secondary | ICD-10-CM | POA: Insufficient documentation

## 2021-03-23 DIAGNOSIS — E785 Hyperlipidemia, unspecified: Secondary | ICD-10-CM | POA: Diagnosis not present

## 2021-03-23 DIAGNOSIS — C49A2 Gastrointestinal stromal tumor of stomach: Secondary | ICD-10-CM | POA: Insufficient documentation

## 2021-03-23 DIAGNOSIS — K900 Celiac disease: Secondary | ICD-10-CM | POA: Insufficient documentation

## 2021-03-23 DIAGNOSIS — D214 Benign neoplasm of connective and other soft tissue of abdomen: Secondary | ICD-10-CM

## 2021-03-23 DIAGNOSIS — E538 Deficiency of other specified B group vitamins: Secondary | ICD-10-CM | POA: Insufficient documentation

## 2021-03-23 DIAGNOSIS — Z8616 Personal history of COVID-19: Secondary | ICD-10-CM | POA: Insufficient documentation

## 2021-03-23 NOTE — Progress Notes (Signed)
[facility] New Patient Consult   Requesting MD: Sharon Mayer, Md [address],  [place] [address]   Sharon Shepherd 70 y.o.  [birth year]    Reason for Consult: Gastrointestinal stromal tumor of the stomach   HPI: Ms. Sharon Shepherd has a history of celiac disease.  She reports having this diagnosis for approximately 20 years.  She has a history of associated iron deficiency anemia and has required infrequent IV iron infusions.  She cannot tolerate oral iron secondary to nausea, abdominal pain, and constipation.  She was referred to Dr. Carlean Shepherd and was taken to upper and lower endoscopy procedures to evaluate iron and vitamin B12 deficiencies.  A colonoscopy on 12/25/2020 revealed 2 sessile polyps in the distal rectum and diverticulosis in the colon.  An upper endoscopy on 12/25/2020 revealed a 3 mm sessile polyp with no stigmata of bleeding in the anterior wall of the gastric body.  The polyp was removed.  A small submucosal mass with no bleeding was found in the posterior wall of the gastric body.  Patchy erythematous mucosa without bleeding was found in the gastric antrum.  Biopsies were obtained.  The duodenum was normal.  Biopsies were obtained for histology.  There was a 5 cm hiatal hernia.  The pathology from 12/25/2020 revealed benign small bowel mucosa.  The gastric polyp returned as a hyperplastic polyp.  The rectum polyps returned as hyperplastic polyps.  No dysplasia or malignancy.  She was referred for an upper EUS by Dr. Ardis Shepherd on 02/15/2021 to evaluate the subepithelial nodule noted on EGD.  On endoscopy an approximate 1 cm subepithelial lesion was noted at the posterior wall of the gastric body with normal overlying mucosa.  On ultrasound an oval subepithelial calcified lesion was noted.  The lesion appeared to communicate with the muscularis propria and measured 1.6 cm.  The borders were well-defined.  A fine-needle aspiration biopsy was performed.  No  perigastric adenopathy.  Limited views of the pancreas, common bile duct, and liver were normal.  The cytology from the gastric lesion biopsy returned as a spindle cell neoplasm, with the neoplastic cells positive for CD117 and CD34 and negative for SMA.  The findings are consistent with a gastrointestinal stromal tumor.  She reports Dr. Carlean Shepherd recommends observation.  She is referred for medical oncology evaluation.   She was treated with IV Sharon Shepherd for 4 weeks beginning 02/21/2021.  The hemoglobin returned at 10.5 with an MCV of 67 on 01/26/2021 and 12.1 with an MCV of 74 on 03/21/2021.  The ferritin was measured at 9 on 01/26/2021 and 165 on 03/21/2021.   Past Medical History:  Diagnosis Date   Allergy    Anemia    Celiac disease    COVID 09/25/2020   Diverticulitis    GERD (gastroesophageal reflux disease)    Hyperlipidemia    Hypertension    Sleep disturbance    Thyroiditis, lymphocytic    surgery 2013   Vitamin B12 deficiency    unresponsive to oral replacement    .  G2 P2   .  Positional vertigo  Past Surgical History:  Procedure Laterality Date   COLONOSCOPY     ESOPHAGOGASTRODUODENOSCOPY  03/04   colon   ESOPHAGOGASTRODUODENOSCOPY N/A 02/15/2021   Procedure: ESOPHAGOGASTRODUODENOSCOPY (EGD);  Surgeon: Sharon Banister, MD;  Location: [facility];  Service: Endoscopy;  Laterality: N/A;   EUS N/A 02/15/2021   Procedure: UPPER ENDOSCOPIC ULTRASOUND (EUS) RADIAL;  Surgeon: Sharon Banister, MD;  Location:  [facility];  Service: Endoscopy;  Laterality: N/A;   FINE NEEDLE ASPIRATION N/A 02/15/2021   Procedure: FINE NEEDLE ASPIRATION (FNA) LINEAR;  Surgeon: Sharon Banister, MD;  Location: [facility];  Service: Endoscopy;  Laterality: N/A;   Laryngeal implant  2/13   after damage during thyroidectomy   THYROIDECTOMY  12/12   Lymphocytic thyroiditis with goiter--Dr Sharon Shepherd    Medications: Reviewed  Allergies:  Allergies  Allergen Reactions   Codeine Itching     Family history: Her paternal uncle had "bone cancer "  Social History:   She lives with her husband and Sharon Shepherd.  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  She does not use cigarettes.  She reports rare alcohol use.  No transfusion history.  No risk factor for HIV or hepatitis.  She has received COVID-19 vaccines.  ROS:   Positives include: Fatigue when she is iron deficient, chronic constipation-relieved with Linzess, mild intermittent mid back pain for several months  A complete ROS was otherwise negative.  Physical Exam:  Blood pressure (!) 145/89, pulse 73, temperature 97.8 F (36.6 C), temperature source Oral, resp. rate 20, height 5' 6"  (1.676 m), weight 201 lb 9.6 oz (91.4 kg), SpO2 100 %.  HEENT: Neck without mass Lungs: Clear bilaterally Cardiac: Regular rate and rhythm Abdomen: No hepatosplenomegaly, no mass, nontender  Vascular: No leg edema Lymph nodes: No cervical, supraclavicular, axillary, or inguinal nodes Neurologic: Alert and oriented, the motor exam appears intact in the upper and lower extremities bilaterally Skin: No rash Musculoskeletal: No spine tenderness   LAB:  CBC  Lab Results  Component Value Date   WBC 4.7 11/24/2020   HGB 10.4 (L) 11/24/2020   HCT 34.6 11/24/2020   MCV 69 (L) 11/24/2020   PLT 207 11/24/2020   NEUTROABS 2.1 11/24/2020       Assessment/Plan:   Gastrointestinal stromal tumor of the stomach Small submucosal mass with no bleeding at the posterior wall of the gastric body on EGD 12/25/2020 EUS 02/15/2021-approximately 1 cm subepithelial lesion at the posterior wall of the gastric body with normal overlying mucosa, ultrasound-hypoechoic calcified oval 1.6 cm lesion communicating with the muscularis propria layer.  Well-defined outer endoscopic borders, FNA-spindle cell neoplasm, CD117 and CD34 positive History of iron deficiency anemia, intolerant of oral iron, status post IV iron July 2022 History of celiac  disease B12 deficiency Hypertension Thyroidectomy Vocal cord implant   Disposition:   Ms. Sharon Shepherd was incidentally found to have a small gastric GIST on an endoscopy in May.  I discussed the prognosis and treatment options with her.  She understands observation is often recommended for GIST tumors less than 2 cm.  The tumor does not appear to have high risk features.  I agree with the recommendation for observation.  She ask about the mitotic rate.  I explained it is possible this cannot be determined on the needle biopsy.  I will present her case at the multidisciplinary GI tumor conference on 03/28/2021.  We will review the pathology, discuss treatment options, and the indication for imaging.  She would like to consider the options of observation versus a second opinion appointment at [facility].  We will contact her after the GI tumor conference next week.  She will continue follow-up with her primary provider and Dr. Carlean Shepherd for management of iron deficiency anemia, the celiac diagnosis, and surveillance of the GIST.  I am available to see her in the future as needed.  Betsy Coder, MD  03/23/2021, 4:00 PM

## 2021-03-26 ENCOUNTER — Telehealth: Payer: Self-pay | Admitting: Oncology

## 2021-03-26 NOTE — Telephone Encounter (Signed)
F/u as needed per 8/12 los

## 2021-03-28 ENCOUNTER — Encounter: Payer: Self-pay | Admitting: Internal Medicine

## 2021-03-28 ENCOUNTER — Other Ambulatory Visit: Payer: Self-pay

## 2021-03-28 NOTE — Progress Notes (Signed)
The proposed treatment discussed in conference is for discussion purpose only and is not a binding recommendation.  The patients have not been physically examined, or presented with their treatment options.  Therefore, final treatment plans cannot be decided.  

## 2021-03-29 ENCOUNTER — Telehealth: Payer: Self-pay

## 2021-03-29 ENCOUNTER — Other Ambulatory Visit: Payer: Self-pay | Admitting: Internal Medicine

## 2021-03-29 DIAGNOSIS — C49A0 Gastrointestinal stromal tumor, unspecified site: Secondary | ICD-10-CM

## 2021-03-29 DIAGNOSIS — D214 Benign neoplasm of connective and other soft tissue of abdomen: Secondary | ICD-10-CM

## 2021-03-29 NOTE — Telephone Encounter (Signed)
-----   Message from Gatha Mayer, MD sent at 03/28/2021  4:09 PM EDT ----- Regarding: Needs CT abdomen pelvis This lady needs a CT abdomen pelvis with contrast because of GIST of the stomach.  Please arrange.  I have communicated through [place] and she is expecting to hear.

## 2021-03-29 NOTE — Telephone Encounter (Signed)
Patient has been scheduled for [facility] CT 04/03/21 2:00.  She understands to come pick up her instructions and contrast.  She is provided an order to have her labs drawn at [facility], where she prefers to have them drawn. .  She understands to have those drawn in enough time to result for Tuesday.

## 2021-03-30 ENCOUNTER — Telehealth: Payer: Self-pay

## 2021-03-30 LAB — CREATININE, SERUM
Creatinine, Ser: 0.69 mg/dL (ref 0.57–1.00)
eGFR: 94 mL/min/{1.73_m2} (ref >59)

## 2021-03-30 LAB — BUN: BUN: 10 mg/dL (ref 8–27)

## 2021-03-30 NOTE — Telephone Encounter (Signed)
TC to Pt per Dr Benay Spice informed Pt He reviewed pathology and its confirmed. Dr. Carlean Purl will order CT scan. (Pt already scheduled and spoke with Dr Carlean Purl) Informed Pt Dr Benay Spice stated its likely a low grade but he can't give a definitive grade on just the needle biopsy he recommends observation with endoscopic and F/U as recommended.Pt verbalized understanding.

## 2021-04-03 ENCOUNTER — Ambulatory Visit (INDEPENDENT_AMBULATORY_CARE_PROVIDER_SITE_OTHER)
Admission: RE | Admit: 2021-04-03 | Discharge: 2021-04-03 | Disposition: A | Discharge status: Home / Self Care | Payer: 59 | Source: Ambulatory Visit | Attending: Internal Medicine | Admitting: Internal Medicine

## 2021-04-03 ENCOUNTER — Other Ambulatory Visit: Payer: Self-pay

## 2021-04-03 DIAGNOSIS — C49A0 Gastrointestinal stromal tumor, unspecified site: Secondary | ICD-10-CM | POA: Diagnosis not present

## 2021-04-03 MED ORDER — IOHEXOL 300 MG/ML  SOLN
100.0000 mL | Freq: Once | INTRAMUSCULAR | Status: AC | PRN
  Administered 2021-04-03: 100 mL via INTRAVENOUS

## 2021-04-04 ENCOUNTER — Telehealth: Payer: 59 | Admitting: Oncology

## 2021-04-05 ENCOUNTER — Other Ambulatory Visit: Payer: Self-pay | Admitting: Internal Medicine

## 2021-06-15 ENCOUNTER — Ambulatory Visit: Payer: 59

## 2021-06-16 ENCOUNTER — Other Ambulatory Visit: Payer: Self-pay | Admitting: Internal Medicine

## 2021-06-22 ENCOUNTER — Ambulatory Visit (INDEPENDENT_AMBULATORY_CARE_PROVIDER_SITE_OTHER): Payer: 59

## 2021-06-22 ENCOUNTER — Other Ambulatory Visit: Payer: Self-pay

## 2021-06-22 DIAGNOSIS — Z23 Encounter for immunization: Secondary | ICD-10-CM

## 2021-07-18 ENCOUNTER — Other Ambulatory Visit: Payer: Self-pay | Admitting: Internal Medicine

## 2021-09-20 ENCOUNTER — Telehealth: Payer: Self-pay | Admitting: Internal Medicine

## 2021-09-20 NOTE — Telephone Encounter (Signed)
Sent MY CHART message as follows:  "I have a question concerning an upper GI recheck that Dr. Edison Nasuti did from a referral from Dr. Carlean Purl. I would like to schedule the next one for March if possible. Was not sure if I need to make another appointment with  Dr .Carlean Purl"

## 2021-09-20 NOTE — Telephone Encounter (Signed)
Pt is questioning if she needs an office visit or can she just be scheduled  for the Endoscopic Ultrasound: Pt was made aware that Dr. Carlean Purl is out of the office this week so it will be next week before he responds. Pt verbalized understanding with all questions answered.  Please advise    Copied and Pasted from Pt chart:     Ct Scan 04/03/2022   Sharon Shepherd,   This is good news on the CT scan.  No signs of spread of the tumor in your stomach.  Their measurements were slightly smaller I think but there can be some variability.  The bottom line is this is not big and it has not gone anywhere else.   We had discussed the possibility of repeating an endoscopic ultrasound 6 months from your last as opposed to a year versus you seeing a surgeon to consider having it removed.   I am happy to discuss by phone it just is not a good time right now but please let me know your thoughts and I will contact you next week if you want to speak and do not think we can handle it through [place].

## 2021-09-21 ENCOUNTER — Other Ambulatory Visit: Payer: Self-pay

## 2021-09-21 ENCOUNTER — Emergency Department (HOSPITAL_COMMUNITY): Payer: 59

## 2021-09-21 ENCOUNTER — Ambulatory Visit: Payer: 59 | Admitting: Family

## 2021-09-21 ENCOUNTER — Encounter: Payer: Self-pay | Admitting: Family

## 2021-09-21 ENCOUNTER — Encounter (HOSPITAL_COMMUNITY): Payer: Self-pay

## 2021-09-21 ENCOUNTER — Emergency Department (HOSPITAL_COMMUNITY)
Admission: EM | Admit: 2021-09-21 | Discharge: 2021-09-21 | Disposition: A | Discharge status: Home / Self Care | Payer: 59 | Attending: Student | Admitting: Student

## 2021-09-21 VITALS — BP 108/68 | HR 80 | Temp 97.0°F | Ht 66.0 in | Wt 204.0 lb

## 2021-09-21 DIAGNOSIS — K5909 Other constipation: Secondary | ICD-10-CM

## 2021-09-21 DIAGNOSIS — K5732 Diverticulitis of large intestine without perforation or abscess without bleeding: Secondary | ICD-10-CM | POA: Insufficient documentation

## 2021-09-21 DIAGNOSIS — R109 Unspecified abdominal pain: Secondary | ICD-10-CM | POA: Diagnosis present

## 2021-09-21 DIAGNOSIS — K5904 Chronic idiopathic constipation: Secondary | ICD-10-CM

## 2021-09-21 DIAGNOSIS — R1012 Left upper quadrant pain: Secondary | ICD-10-CM | POA: Diagnosis not present

## 2021-09-21 DIAGNOSIS — E876 Hypokalemia: Secondary | ICD-10-CM | POA: Diagnosis not present

## 2021-09-21 DIAGNOSIS — K5792 Diverticulitis of intestine, part unspecified, without perforation or abscess without bleeding: Secondary | ICD-10-CM

## 2021-09-21 DIAGNOSIS — R7401 Elevation of levels of liver transaminase levels: Secondary | ICD-10-CM | POA: Insufficient documentation

## 2021-09-21 LAB — COMPREHENSIVE METABOLIC PANEL
ALT: 53 U/L — ABNORMAL HIGH (ref 0–44)
AST: 57 U/L — ABNORMAL HIGH (ref 15–41)
Albumin: 4.3 g/dL (ref 3.5–5.0)
Alkaline Phosphatase: 106 U/L (ref 38–126)
Anion gap: 9 (ref 5–15)
BUN: 18 mg/dL (ref 8–23)
CO2: 26 mmol/L (ref 22–32)
Calcium: 9.2 mg/dL (ref 8.9–10.3)
Chloride: 102 mmol/L (ref 98–111)
Creatinine, Ser: 0.94 mg/dL (ref 0.44–1.00)
GFR, Estimated: >60 mL/min (ref >60)
Glucose, Bld: 96 mg/dL (ref 70–99)
Potassium: 3.3 mmol/L — ABNORMAL LOW (ref 3.5–5.1)
Sodium: 137 mmol/L (ref 135–145)
Total Bilirubin: 1.6 mg/dL — ABNORMAL HIGH (ref 0.3–1.2)
Total Protein: 7.5 g/dL (ref 6.5–8.1)

## 2021-09-21 LAB — CBC WITH DIFFERENTIAL/PLATELET
Abs Immature Granulocytes: 0.03 10*3/uL (ref 0.00–0.07)
Basophils Absolute: 0 10*3/uL (ref 0.0–0.1)
Basophils Relative: 1 %
Eosinophils Absolute: 0 10*3/uL (ref 0.0–0.5)
Eosinophils Relative: 0 %
HCT: 43.4 % (ref 36.0–46.0)
Hemoglobin: 14.2 g/dL (ref 12.0–15.0)
Immature Granulocytes: 1 %
Lymphocytes Relative: 19 %
Lymphs Abs: 1 10*3/uL (ref 0.7–4.0)
MCH: 28 pg (ref 26.0–34.0)
MCHC: 32.7 g/dL (ref 30.0–36.0)
MCV: 85.4 fL (ref 80.0–100.0)
Monocytes Absolute: 0.8 10*3/uL (ref 0.1–1.0)
Monocytes Relative: 16 %
Neutro Abs: 3.4 10*3/uL (ref 1.7–7.7)
Neutrophils Relative %: 63 %
Platelets: 178 10*3/uL (ref 150–400)
RBC: 5.08 MIL/uL (ref 3.87–5.11)
RDW: 13.5 % (ref 11.5–15.5)
WBC: 5.2 10*3/uL (ref 4.0–10.5)
nRBC: 0 % (ref 0.0–0.2)

## 2021-09-21 LAB — URINALYSIS, ROUTINE W REFLEX MICROSCOPIC
Bacteria, UA: NONE SEEN
Bilirubin Urine: NEGATIVE
Glucose, UA: NEGATIVE mg/dL
Hgb urine dipstick: NEGATIVE
Ketones, ur: 5 mg/dL — AB
Nitrite: NEGATIVE
Protein, ur: NEGATIVE mg/dL
Specific Gravity, Urine: 1.014 (ref 1.005–1.030)
pH: 5 (ref 5.0–8.0)

## 2021-09-21 LAB — LIPASE, BLOOD: Lipase: 34 U/L (ref 11–51)

## 2021-09-21 MED ORDER — HYDROCODONE-ACETAMINOPHEN 5-325 MG PO TABS: 1.0000 | ORAL_TABLET | Freq: Four times a day (QID) | ORAL | 0 refills | Status: DC | PRN

## 2021-09-21 MED ORDER — ONDANSETRON 4 MG PO TBDP: 4.0000 mg | ORAL_TABLET | Freq: Three times a day (TID) | ORAL | 0 refills | Status: DC | PRN

## 2021-09-21 MED ORDER — METRONIDAZOLE 500 MG PO TABS
500.0000 mg | ORAL_TABLET | Freq: Once | ORAL | Status: AC
  Administered 2021-09-21: 500 mg via ORAL
  Filled 2021-09-21: qty 1

## 2021-09-21 MED ORDER — IOHEXOL 300 MG/ML  SOLN
100.0000 mL | Freq: Once | INTRAMUSCULAR | Status: AC | PRN
  Administered 2021-09-21: 100 mL via INTRAVENOUS

## 2021-09-21 MED ORDER — METRONIDAZOLE 500 MG PO TABS: 500.0000 mg | ORAL_TABLET | Freq: Three times a day (TID) | ORAL | 0 refills | Status: DC

## 2021-09-21 MED ORDER — CIPROFLOXACIN HCL 500 MG PO TABS
500.0000 mg | ORAL_TABLET | Freq: Once | ORAL | Status: AC
  Administered 2021-09-21: 500 mg via ORAL
  Filled 2021-09-21: qty 1

## 2021-09-21 MED ORDER — CIPROFLOXACIN HCL 500 MG PO TABS: 500.0000 mg | ORAL_TABLET | Freq: Two times a day (BID) | ORAL | 0 refills | Status: DC

## 2021-09-21 NOTE — ED Provider Notes (Signed)
[facility] DEPT Provider Note   CSN: [id number] Arrival date & time: 09/21/21  1351     History  Chief Complaint  Patient presents with   Abdominal Pain    Sharon Shepherd is a 71 y.o. female.  Patient with history of GIST tumor and diverticulosis -- presents with 2 to 3 days of mid abdominal pain and constipation.  Symptoms gradually worsening.  Saw her PCP today and was unable to be seen by GI.  She was sent to the emergency department for further evaluation.  No nausea or vomiting.  Temperature of 99.5 F yesterday.  She denies urinary symptoms.  She has tried medications for constipation without improvement.        Home Medications Prior to Admission medications   Medication Sig Start Date End Date Taking? Authorizing Provider  albuterol (PROVENTIL HFA;VENTOLIN HFA) 108 (90 Base) MCG/ACT inhaler Inhale 2 puffs into the lungs every 6 (six) hours as needed for wheezing or shortness of breath. 10/20/18   Lesleigh Noe, MD  aspirin 81 MG chewable tablet Chew 81 mg by mouth every other day. In the evening    [provider]  atorvastatin (LIPITOR) 20 MG tablet TAKE 1 TABLET BY MOUTH  DAILY 03/15/21   [name], MD  B-D 3CC LUER-LOK SYR 25GX1/2" 25G X 1-1/2" 3 ML MISC See admin instructions. 12/12/20   [provider]  cetirizine (ZYRTEC) 10 MG tablet TAKE ONE TABLET BY MOUTH EVERY DAY Patient taking differently: Take 10 mg by mouth at bedtime. 11/14/20   [name], MD  Cholecalciferol (VITAMIN D3) 5000 UNITS CAPS Take 5,000 Units by mouth at bedtime.    [provider]  cyanocobalamin (,VITAMIN B-12,) 1000 MCG/ML injection Inject 1,000 mcg into the muscle daily. 10/02/20   [provider]  diazepam (VALIUM) 5 MG tablet Take 1 tablet (5 mg total) by mouth 3 (three) times daily as needed. For severe vertigo 12/25/18   Viviana Simpler I, MD  ibuprofen (ADVIL) 200 MG tablet Take 400 mg by mouth every 8  (eight) hours as needed (pain.).    [provider]  levothyroxine (SYNTHROID) 75 MCG tablet TAKE 1 TABLET BY MOUTH  DAILY 04/05/21   [name], MD  linaclotide [facility]) 290 MCG CAPS capsule Take 1 capsule (290 mcg total) by mouth every other day. In the morning 06/18/21   [name], MD  meclizine (ANTIVERT) 25 MG tablet Take 1-2 tablets (25-50 mg total) by mouth 3 (three) times daily as needed. For vertigo Patient taking differently: Take 25-50 mg by mouth 3 (three) times daily as needed (dizziness/vertigo). 10/02/20   [name], MD  Syringe/Needle, Disp, 25G X 1-1/2" 1 ML MISC 1 each by Does not apply route every 30 (thirty) days. 10/02/20   [name], MD  valsartan-hydrochlorothiazide (DIOVAN HCT) 160-12.5 MG tablet Take 1 tablet by mouth daily. Patient taking differently: Take 1 tablet by mouth every evening. 10/05/20   [name], MD      Allergies    Codeine    Review of Systems   Review of Systems  Physical Exam Updated Vital Signs BP 122/81    Pulse 77    Temp 97.8 F (36.6 C) (Oral)    Resp 16    Ht 5\' 6"  (1.676 m)    Wt 92.5 kg    SpO2 95%    BMI 32.93 kg/m  Physical Exam Vitals and nursing note reviewed.  Constitutional:  General: She is not in acute distress.    Appearance: She is well-developed.  HENT:     Head: Normocephalic and atraumatic.     Right Ear: External ear normal.     Left Ear: External ear normal.     Nose: Nose normal.  Eyes:     Conjunctiva/sclera: Conjunctivae normal.  Cardiovascular:     Rate and Rhythm: Normal rate and regular rhythm.     Heart sounds: No murmur heard. Pulmonary:     Effort: No respiratory distress.     Breath sounds: No wheezing, rhonchi or rales.  Abdominal:     Palpations: Abdomen is soft.     Tenderness: There is abdominal tenderness in the periumbilical area. There is no guarding or rebound. Negative signs include Murphy's sign.  Musculoskeletal:     Cervical back: Normal  range of motion and neck supple.     Right lower leg: No edema.     Left lower leg: No edema.  Skin:    General: Skin is warm and dry.     Findings: No rash.  Neurological:     General: No focal deficit present.     Mental Status: She is alert. Mental status is at baseline.     Motor: No weakness.  Psychiatric:        Mood and Affect: Mood normal.    ED Results / Procedures / Treatments   Labs (all labs ordered are listed, but only abnormal results are displayed) Labs Reviewed  COMPREHENSIVE METABOLIC PANEL - Abnormal; Notable for the following components:      Result Value   Potassium 3.3 (*)    AST 57 (*)    ALT 53 (*)    Total Bilirubin 1.6 (*)    All other components within normal limits  CBC WITH DIFFERENTIAL/PLATELET  LIPASE, BLOOD  URINALYSIS, ROUTINE W REFLEX MICROSCOPIC    EKG None  Radiology CT ABDOMEN PELVIS W CONTRAST  Result Date: 09/21/2021 CLINICAL DATA:  Left-sided pain EXAM: CT ABDOMEN AND PELVIS WITH CONTRAST TECHNIQUE: Multidetector CT imaging of the abdomen and pelvis was performed using the standard protocol following bolus administration of intravenous contrast. RADIATION DOSE REDUCTION: This exam was performed according to the departmental dose-optimization program which includes automated exposure control, adjustment of the mA and/or kV according to patient size and/or use of iterative reconstruction technique. CONTRAST:  143mL OMNIPAQUE IOHEXOL 300 MG/ML  SOLN COMPARISON:  CT 04/03/2021 FINDINGS: Lower chest: Lung bases demonstrate no acute consolidation or effusion. Small hiatal hernia. Hepatobiliary: No focal liver abnormality is seen. No gallstones, gallbladder wall thickening, or biliary dilatation. Pancreas: Unremarkable. No pancreatic ductal dilatation or surrounding inflammatory changes. Spleen: Normal in size without focal abnormality. Adrenals/Urinary Tract: Adrenal glands are normal. Kidneys show mild bilateral hydronephrosis without hydroureter.  The bladder is unremarkable Stomach/Bowel: Redemonstrated small calcified submucosal mass within the stomach measuring 10 mm. No dilated small bowel. Negative appendix. Extensive diverticular disease of left colon. Interval wall thickening and inflammatory process at the sigmoid colon consistent with acute diverticulitis. Small focal gas collection with surrounding inflammation, series 2, image 70 and coronal series 5, image 83 which may represent focally inflamed diverticulum versus small contained perforation. Vascular/Lymphatic: Moderate aortic atherosclerosis. No aneurysm. No suspicious lymph nodes. Reproductive: Uterus and bilateral adnexa are unremarkable. Other: Negative for pelvic effusion. Musculoskeletal: No acute osseous abnormality IMPRESSION: 1. Findings consistent with acute sigmoid colon diverticulitis. Small focal gas collection at the inflamed sigmoid colon with surrounding inflammation either reflecting focally inflamed diverticulum  versus small area of contained perforation. No drainable abscess. 2. 1 cm calcified submucosal mass within the stomach as before 3. Mild bilateral right greater than left hydronephrosis without obstructing stone or evidence for hydroureter. Electronically Signed   By: Donavan Foil M.D.   On: 09/21/2021 18:38    Procedures Procedures    Medications Ordered in ED Medications - No data to display  ED Course/ Medical Decision Making/ A&P    Patient seen and examined. History obtained directly from patient. Work-up including labs, imaging, EKG ordered in triage, if performed, were reviewed.    Labs/EKG: Independently reviewed and interpreted.  This included: CBC normal; CMP with mild hypokalemia, mild elevation AST ALT, mild elevation T. Bili; lipase normal,   Imaging: CT scan ordered and pending.  Medications/Fluids: None ordered, patient declines pain medication  Most recent vital signs reviewed and are as follows: BP 122/81    Pulse 77    Temp 97.8  F (36.6 C) (Oral)    Resp 16    Ht 5\' 6"  (1.676 m)    Wt 92.5 kg    SpO2 95%    BMI 32.93 kg/m   Initial impression: Abdominal pain  7:36 PM Reassessment performed. Patient appears comfortable.   Labs and imaging personally reviewed and interpreted including: CT scan showing acute diverticulitis.  Reviewed additional pertinent lab work and imaging with patient at bedside including: CT results.  Most current vital signs reviewed and are as follows: BP 127/72    Pulse 76    Temp 97.8 F (36.6 C) (Oral)    Resp 15    Ht 5\' 6"  (1.676 m)    Wt 92.5 kg    SpO2 95%    BMI 32.93 kg/m   Plan: Discharge to home  Home treatment: Prescription written for Cipro, Flagyl, Vicodin, Zofran.   Return and follow-up instructions: The patient was urged to return to the Emergency Department immediately with worsening of current symptoms, worsening abdominal pain, persistent vomiting, blood noted in stools, fever, or any other concerns. The patient verbalized understanding.   Encouraged patient to follow-up with their provider in 3-5 days. Patient verbalized understanding and agreed with plan.                             Medical Decision Making Amount and/or Complexity of Data Reviewed Labs: ordered. Radiology: ordered.  Risk Prescription drug management.   For this patient's complaint of abdominal pain, the following conditions were considered on the differential diagnosis: gastritis/PUD, enteritis/duodenitis, appendicitis, cholelithiasis/cholecystitis, cholangitis, pancreatitis, ruptured viscus, colitis, diverticulitis, proctitis, cystitis, pyelonephritis, ureteral colic, aortic dissection, aortic aneurysm. In women, ectopic pregnancy, pelvic inflammatory disease, ovarian cysts, and tubo-ovarian abscess were also considered. Atypical chest etiologies were also considered including ACS, PE, and pneumonia.   CT demonstrated acute diverticulitis which is likely the cause of the patient's pain.  She  does have some white blood cells in her urine likely reactive from the diverticulitis that she does not have significant UTI symptoms.  Question of small contained perforation on CT versus air in a diverticulum.  Patient is not febrile, her pain is controlled and her white blood cell count is normal.  I have low concern for complications and patient is comfortable discharged home and reliable to return if symptoms do change or worsen.  She has appropriate GI follow-up.  The patient's vital signs, pertinent lab work and imaging were reviewed and interpreted as discussed in the ED  course. Hospitalization was considered for further testing, treatments, or serial exams/observation. However as patient is well-appearing, has a stable exam, and reassuring studies today, I do not feel that they warrant admission at this time. This plan was discussed with the patient who verbalizes agreement and comfort with this plan and seems reliable and able to return to the Emergency Department with worsening or changing symptoms.          Final Clinical Impression(s) / ED Diagnoses Final diagnoses:  Acute diverticulitis    Rx / DC Orders ED Discharge Orders          Ordered    ciprofloxacin (CIPRO) 500 MG tablet  2 times daily        09/21/21 1934    metroNIDAZOLE (FLAGYL) 500 MG tablet  3 times daily        09/21/21 1934    HYDROcodone-acetaminophen (NORCO/VICODIN) 5-325 MG tablet  Every 6 hours PRN        09/21/21 1934    ondansetron (ZOFRAN-ODT) 4 MG disintegrating tablet  Every 8 hours PRN        09/21/21 1934              Carlisle Cater, Hershal Coria 09/21/21 1937    Daleen Bo, MD 09/24/21 1230

## 2021-09-21 NOTE — Discharge Instructions (Addendum)
Please read and follow all provided instructions.  Your diagnoses today include:  1. Acute diverticulitis     Tests performed today include: Blood cell counts and platelets: Normal white blood cell count Kidney and liver function tests: Very slightly elevated liver tests Pancreas function test (called lipase): Normal Urine test to look for infection: Some white blood cells, possibly related to the diverticulitis CT scan of the abdomen and pelvis: Shows acute diverticulitis Vital signs. See below for your results today.   Medications prescribed:  Ciprofloxacin - antibiotic  You have been prescribed an antibiotic medicine: take the entire course of medicine even if you are feeling better. Stopping early can cause the antibiotic not to work.  Metronidazole - antibiotic  You have been prescribed an antibiotic medicine: take the entire course of medicine even if you are feeling better. Stopping early can cause the antibiotic not to work. Do not drink alcohol when taking this medication.   Vicodin (hydrocodone/acetaminophen) - narcotic pain medication  DO NOT drive or perform any activities that require you to be awake and alert because this medicine can make you drowsy. BE VERY CAREFUL not to take multiple medicines containing Tylenol (also called acetaminophen). Doing so can lead to an overdose which can damage your liver and cause liver failure and possibly death.  Zofran (ondansetron) - for nausea and vomiting  Take any prescribed medications only as directed.  Home care instructions:  Follow any educational materials contained in this packet.  Follow-up instructions: Please follow-up with your primary care provider or GI doctor in the next 3-5 days for further evaluation of your symptoms.    Return instructions:  SEEK IMMEDIATE MEDICAL ATTENTION IF: The pain does not go away or becomes severe  A temperature above 101F develops  Repeated vomiting occurs (multiple episodes)   The pain becomes localized to portions of the abdomen. The right side could possibly be appendicitis. In an adult, the left lower portion of the abdomen could be colitis or diverticulitis.  Blood is being passed in stools or vomit (bright red or black tarry stools)  You develop chest pain, difficulty breathing, dizziness or fainting, or become confused, poorly responsive, or inconsolable (young children) If you have any other emergent concerns regarding your health  Additional Information: Abdominal (belly) pain can be caused by many things. Your caregiver performed an examination and possibly ordered blood/urine tests and imaging (CT scan, x-rays, ultrasound). Many cases can be observed and treated at home after initial evaluation in the emergency department. Even though you are being discharged home, abdominal pain can be unpredictable. Therefore, you need a repeated exam if your pain does not resolve, returns, or worsens. Most patients with abdominal pain don't have to be admitted to the hospital or have surgery, but serious problems like appendicitis and gallbladder attacks can start out as nonspecific pain. Many abdominal conditions cannot be diagnosed in one visit, so follow-up evaluations are very important.  Your vital signs today were: BP 115/67    Pulse 73    Temp 97.8 F (36.6 C) (Oral)    Resp 15    Ht 5\' 6"  (1.676 m)    Wt 92.5 kg    SpO2 98%    BMI 32.93 kg/m  If your blood pressure (bp) was elevated above 135/85 this visit, please have this repeated by your doctor within one month. --------------

## 2021-09-21 NOTE — Assessment & Plan Note (Addendum)
Severe pain on light palpation luq adomen as well epigastric area and llq. Concern for impaction, pt sent to ER stable upon leaving husband will drive her to [facility]. Pt also with stomach tumor, which adds further concern.

## 2021-09-21 NOTE — ED Provider Triage Note (Signed)
Emergency Medicine Provider Triage Evaluation Note  Sharon Shepherd , a 71 y.o. female  was evaluated in triage.  She has a history of GIST tumor and diverticulosis.  Pt complains of 2 to 3 days of mid abdominal pain and constipation.  Symptoms gradually worsening.  Saw her PCP today and was unable to be seen by GI.  She was sent to the emergency department for further evaluation.  No nausea or vomiting.  Temperature of 99.5 F yesterday.  She denies urinary symptoms.  She has tried medications for constipation without improvement.  Review of Systems  Positive: Abdominal pain Negative: Vomiting  Physical Exam  BP (!) 142/77 (BP Location: Right Arm)    Pulse 87    Temp 97.8 F (36.6 C) (Oral)    Resp 18    SpO2 100%  Gen:   Awake, no distress   Resp:  Normal effort  MSK:   Moves extremities without difficulty  Other:  Patient with tenderness to palpation, mild to moderate, over the midportion of the left and right abdomen.  Medical Decision Making  Medically screening exam initiated at 2:20 PM.  Appropriate orders placed.  Sharon Shepherd [facility] was informed that the remainder of the evaluation will be completed by another provider, this initial triage assessment does not replace that evaluation, and the importance of remaining in the ED until their evaluation is complete.     Carlisle Cater, PA-C 09/21/21 1421

## 2021-09-21 NOTE — ED Notes (Signed)
Pt ambulatory to bathroom w/o assist 

## 2021-09-21 NOTE — ED Triage Notes (Signed)
Patient c/o left abdominal pain that started 2 days ago. Patient denies any N/v/d.

## 2021-09-21 NOTE — Assessment & Plan Note (Signed)
Failure of both linzess and enema for relief of constipation with increasing pain. Pt to go to ER .

## 2021-09-21 NOTE — Progress Notes (Signed)
Established Patient Office Visit  Subjective:  Patient ID: Sharon Shepherd, female    DOB: [birth year]  Age: 71 y.o. MRN: [id number]  CC:  Chief Complaint  Patient presents with   Abdominal Pain    Lower abdomen started yesterday. No diarrhea. Constipated. Took Linzess last 3 days and it did not help. Has used several enemas as well. Sees Dr Carlean Purl, GI. He is out of the office.    HPI Sharon Shepherd is here today with concerns.   Noted, she does have a tumor in her stomach. Biopsy was possible Low grade, they have decided to monitor via endoscopy every six months for observation. This is Dr. Carlean Purl.   Two days ago started with constipation. Last bowel movement was day before on Tuesday 2/6. She had a pretty decent amount of stool come out then two days ago started with lower abdominal pain, and constipation so she used an enema with no relief. Yesterday yesterday again still with constipation, and then tried another enema with no relief and had a low grade temperature of 99.10F. lower to mid abdominal pain still present and sore to the touch.  No increased flatulence and or burping. Slight nausea.   Ate two jellos ate a protein bar this am. Did have a little bowel movement this am but was runny and small amount. No blood seen in the stool.   Does see GI doctor, Dr. Carlean Purl, but she did not reach out to them yet because he is out of the office currently.   Past Medical History:  Diagnosis Date   Allergy    Anemia    Celiac disease    COVID 09/25/2020   Diverticulitis    GERD (gastroesophageal reflux disease)    Hyperlipidemia    Hypertension    Sleep disturbance    Thyroiditis, lymphocytic    surgery 2013   Vitamin B12 deficiency    unresponsive to oral replacement    Past Surgical History:  Procedure Laterality Date   COLONOSCOPY     ESOPHAGOGASTRODUODENOSCOPY  03/04   colon   ESOPHAGOGASTRODUODENOSCOPY N/A 02/15/2021   Procedure: ESOPHAGOGASTRODUODENOSCOPY  (EGD);  Surgeon: Milus Banister, MD;  Location: [facility];  Service: Endoscopy;  Laterality: N/A;   EUS N/A 02/15/2021   Procedure: UPPER ENDOSCOPIC ULTRASOUND (EUS) RADIAL;  Surgeon: Milus Banister, MD;  Location: [facility];  Service: Endoscopy;  Laterality: N/A;   FINE NEEDLE ASPIRATION N/A 02/15/2021   Procedure: FINE NEEDLE ASPIRATION (FNA) LINEAR;  Surgeon: Milus Banister, MD;  Location: [facility];  Service: Endoscopy;  Laterality: N/A;   Laryngeal implant  2/13   after damage during thyroidectomy   THYROIDECTOMY  12/12   Lymphocytic thyroiditis with goiter--Dr Rochel Brome    Family History  Problem Relation Age of Onset   Other Father        pancreatic absess   Heart attack Mother    Colon cancer Neg Hx    Breast cancer Neg Hx    Esophageal cancer Neg Hx    Stomach cancer Neg Hx    Rectal cancer Neg Hx     Social History   Socioeconomic History   Marital status: Married    Spouse name: Not on file   Number of children: 2   Years of education: Not on file   Highest education level: Not on file  Occupational History   Occupation: IT    Employer: [employer]  Tobacco Use   Smoking status: Never  Smokeless tobacco: Never  Vaping Use   Vaping Use: Never used  Substance and Sexual Activity   Alcohol use: Yes    Comment: rare   Drug use: No   Sexual activity: Not on file  Other Topics Concern   Not on file  Social History Narrative   The patient is married and has 2 children.   She is employed in Librarian, academic at [employer]   Rare alcohol and never tobacco   Social Determinants of Radio broadcast assistant Strain: Not on file  Food Insecurity: Not on file  Transportation Needs: Not on file  Physical Activity: Not on file  Stress: Not on file  Social Connections: Not on file  Intimate Partner Violence: Not on file    Outpatient Medications Prior to Visit  Medication Sig Dispense Refill   albuterol (PROVENTIL HFA;VENTOLIN HFA) 108 (90  Base) MCG/ACT inhaler Inhale 2 puffs into the lungs every 6 (six) hours as needed for wheezing or shortness of breath. 1 Inhaler 1   aspirin 81 MG chewable tablet Chew 81 mg by mouth every other day. In the evening     atorvastatin (LIPITOR) 20 MG tablet TAKE 1 TABLET BY MOUTH  DAILY 90 tablet 3   B-D 3CC LUER-LOK SYR 25GX1/2" 25G X 1-1/2" 3 ML MISC See admin instructions.     cetirizine (ZYRTEC) 10 MG tablet TAKE ONE TABLET BY MOUTH EVERY DAY (Patient taking differently: Take 10 mg by mouth at bedtime.) 100 tablet 4   Cholecalciferol (VITAMIN D3) 5000 UNITS CAPS Take 5,000 Units by mouth at bedtime.     cyanocobalamin (,VITAMIN B-12,) 1000 MCG/ML injection Inject 1,000 mcg into the muscle daily.     diazepam (VALIUM) 5 MG tablet Take 1 tablet (5 mg total) by mouth 3 (three) times daily as needed. For severe vertigo 30 tablet 0   ibuprofen (ADVIL) 200 MG tablet Take 400 mg by mouth every 8 (eight) hours as needed (pain.).     levothyroxine (SYNTHROID) 75 MCG tablet TAKE 1 TABLET BY MOUTH  DAILY 90 tablet 3   linaclotide (LINZESS) 290 MCG CAPS capsule Take 1 capsule (290 mcg total) by mouth every other day. In the morning 90 capsule 1   meclizine (ANTIVERT) 25 MG tablet Take 1-2 tablets (25-50 mg total) by mouth 3 (three) times daily as needed. For vertigo (Patient taking differently: Take 25-50 mg by mouth 3 (three) times daily as needed (dizziness/vertigo).) 60 tablet 0   Syringe/Needle, Disp, 25G X 1-1/2" 1 ML MISC 1 each by Does not apply route every 30 (thirty) days. 1 each 11   valsartan-hydrochlorothiazide (DIOVAN HCT) 160-12.5 MG tablet Take 1 tablet by mouth daily. (Patient taking differently: Take 1 tablet by mouth every evening.) 90 tablet 3   No facility-administered medications prior to visit.    Allergies  Allergen Reactions   Codeine Itching    ROS Review of Systems  Constitutional:  Positive for fever (low grade). Negative for chills, fatigue and unexpected weight change.   Respiratory:  Negative for cough, shortness of breath and wheezing.   Cardiovascular:  Negative for chest pain and palpitations.  Gastrointestinal:  Positive for abdominal pain, constipation and nausea. Negative for blood in stool, diarrhea and vomiting.       Heartburn   Genitourinary:  Negative for flank pain.     Objective:    Physical Exam Constitutional:      General: She is not in acute distress.    Appearance: She is well-developed.  She is obese. She is ill-appearing. She is not toxic-appearing or diaphoretic.  HENT:     Head: Normocephalic.  Abdominal:     General: Abdomen is flat. Bowel sounds are increased. There is distension.     Palpations: Abdomen is soft.     Tenderness: There is abdominal tenderness in the left upper quadrant and left lower quadrant. Negative signs include Murphy's sign and psoas sign.     Comments: Severe tenderness to touch with LUQ   Neurological:     Mental Status: She is alert.    BP 108/68 (BP Location: Left Arm, Patient Position: Sitting, Cuff Size: Large)    Pulse 80    Temp (!) 97 F (36.1 C)    Ht _0  (1.676 m)    Wt 204 lb (92.5 kg)    SpO2 96%    BMI 32.93 kg/m  Wt Readings from Last 3 Encounters:  09/21/21 204 lb (92.5 kg)  09/21/21 204 lb (92.5 kg)  03/23/21 201 lb 9.6 oz (91.4 kg)     Health Maintenance Due  Topic Date Due   DEXA SCAN  Never done   COVID-19 Vaccine (4 - Booster for Pfizer series) 07/21/2020    There are no preventive care reminders to display for this patient.  Lab Results  Component Value Date   TSH 0.921 01/25/2020   Lab Results  Component Value Date   WBC 5.2 09/21/2021   HGB 14.2 09/21/2021   HCT 43.4 09/21/2021   MCV 85.4 09/21/2021   PLT 178 09/21/2021   Lab Results  Component Value Date   NA 137 09/21/2021   K 3.3 (L) 09/21/2021   CO2 26 09/21/2021   GLUCOSE 96 09/21/2021   BUN 18 09/21/2021   CREATININE 0.94 09/21/2021   BILITOT 1.6 (H) 09/21/2021   ALKPHOS 106 09/21/2021    AST 57 (H) 09/21/2021   ALT 53 (H) 09/21/2021   PROT 7.5 09/21/2021   ALBUMIN 4.3 09/21/2021   CALCIUM 9.2 09/21/2021   ANIONGAP 9 09/21/2021   EGFR 94 03/29/2021   No results found for: HGBA1C    Assessment & Plan:   Problem List Items Addressed This Visit       Digestive   Chronic constipation    Failure of both linzess and enema for relief of constipation with increasing pain. Pt to go to ER .         Other   Left upper quadrant abdominal pain - Primary    Severe pain on light palpation luq adomen as well epigastric area and llq. Concern for impaction, pt sent to ER stable upon leaving husband will drive her to [facility]. Pt also with stomach tumor, which adds further concern.      Other Visit Diagnoses     Chronic idiopathic constipation           No orders of the defined types were placed in this encounter.   Follow-up: No follow-ups on file.    Eugenia Pancoast, FNP

## 2021-09-23 NOTE — Telephone Encounter (Signed)
This lady has a small GIST and has been anxious about. Dan, you had recommended recheck at 1 year (vs removal). She did not have surgery and is interested in recheck.  Would you be ok to set it up next available - will be a little early for original recommendation but getting close to 1 year and she was quite concerned before.  Thanks  Glendell Docker

## 2021-09-24 NOTE — Telephone Encounter (Signed)
Pt scheduled for an office visit with Dr. Carlean Purl on 10/17/2021 at 10:10. Pt made aware Pt verbalized understanding with all questions answered.

## 2021-09-24 NOTE — Telephone Encounter (Signed)
I spoke to her.  I reviewed that the lesion in the stomach that is a known GIST is unchanged.  She is comfortable with not repeating an EUS at this time.  She is starting to recover from diverticulitis which was diagnosed by that CT.  Please arrange a nonurgent follow-up appointment with me at least 2 weeks out from now or I can review everything.  No need to schedule any procedures at this time.

## 2021-10-17 ENCOUNTER — Encounter: Payer: Self-pay | Admitting: Internal Medicine

## 2021-10-17 ENCOUNTER — Ambulatory Visit: Payer: 59 | Admitting: Internal Medicine

## 2021-10-17 VITALS — BP 136/76 | HR 72 | Ht 66.0 in | Wt 203.6 lb

## 2021-10-17 DIAGNOSIS — R748 Abnormal levels of other serum enzymes: Secondary | ICD-10-CM | POA: Diagnosis not present

## 2021-10-17 DIAGNOSIS — C49A2 Gastrointestinal stromal tumor of stomach: Secondary | ICD-10-CM

## 2021-10-17 DIAGNOSIS — K5732 Diverticulitis of large intestine without perforation or abscess without bleeding: Secondary | ICD-10-CM | POA: Diagnosis not present

## 2021-10-17 DIAGNOSIS — K900 Celiac disease: Secondary | ICD-10-CM

## 2021-10-17 NOTE — Patient Instructions (Signed)
We will put you in for a 6 month office recall visit. ? ?We have provided you with diverticulitis information to read. ? ?Please take the order for LFT's to be drawn at [facility]. ? ?I appreciate the opportunity to care for you. ?Silvano Rusk, MD, [facility] ?

## 2021-10-17 NOTE — Progress Notes (Signed)
? ?Sharon Shepherd y.o. 01/27/1951 [id number] ? ?Assessment & Plan:  ? ?Encounter Diagnoses  ?Name Primary?  ? Sigmoid diverticulitis Yes  ? Gastrointestinal stromal tumor (GIST) of stomach ([place])   ? Abnormal transaminases   ? Hyperbilirubinemia   ? Celiac disease   ? ? ?She seems recovered from her diverticulitis.  She is advised to call back if she starts to have those symptoms again and we can try to call in antibiotics and arrange follow-up.  I explained that depending upon who is on-call that may or may not happen but in most cases we could probably do that.  Discussed trying to increase fiber through vegetables over time as grains are an issue with respect to her celiac disease and I also think in general grains are "over recommended" and unhelpful or harmful due to the processed carbohydrate content. ? ?Regarding the GIST, she is reassured today that it is stable on the CT scanning.  I suggested follow-up at 1 year to reconsider EUS, she is concerned about waiting that long so we will plan to see each other in 6 months. ? ? ?Recheck liver tests today. ? ?She has a history of celiac disease that was only diagnosed with some biopsies serologies were never positive, duodenal biopsies were negative for celiac disease changes last year.  Continue gluten-free diet. ? ? ?CC: [name], MD ? ? ?Subjective:  ? ?Chief Complaint: Follow-up of diverticulitis and gastric GIST ? ?HPI ?Patient is here for follow-up of the above problems she was seen in the emergency department 09/21/2021 and diagnosed with diverticulitis, and treated with Cipro and metronidazole.  Notes reviewed as well as labs and imaging.  Some occasional left lower quadrant pain but she feels back to baseline.  I do not think she is ever had an official diagnosis of diverticulitis but now that she has had that she thinks she may have suffered through this in the past but on a more short-term basis and recovered spontaneously.  We had  been discussing possible repeat EUS for her known 1 cm gastrointestinal stromal tumor in the stomach.  She is aware that it is stable in size on the CT scan.  She is not pressing for another EUS as originally planned. ? ?CT abdomen pelvis with contrast 09/21/2021 showing diverticulitis and stable gastric submucosal lesion ?IMPRESSION: ?1. Findings consistent with acute sigmoid colon diverticulitis. ?Small focal gas collection at the inflamed sigmoid colon with ?surrounding inflammation either reflecting focally inflamed ?diverticulum versus small area of contained perforation. No ?drainable abscess. ?2. 1 cm calcified submucosal mass within the stomach as before ?3. Mild bilateral right greater than left hydronephrosis without ?obstructing stone or evidence for hydroureter. ?CBC was normal potassium 3.3, transaminases 57 and 53 with bilirubin 1.6\normal alk phos and albumin ?Colonoscopy 12/25/2020 ?Severe diverticulosis in the sigmoid colon, in the descending colon, in the transverse colon ?and in the ascending colon. ?- Two diminutive polyps in the distal rectum, removed with a cold biopsy forceps. Resected ?and retrieved.-Hyperplastic polyps recall 10 years ?- The examination was otherwise normal on direct and retroflexion views.  ?Allergies  ?Allergen Reactions  ? Codeine Itching  ? ?Current Meds  ?Medication Sig  ? albuterol (PROVENTIL HFA;VENTOLIN HFA) 108 (90 Base) MCG/ACT inhaler Inhale 2 puffs into the lungs every 6 (six) hours as needed for wheezing or shortness of breath.  ? aspirin 81 MG chewable tablet Chew 81 mg by mouth every other day. In the evening  ? atorvastatin (LIPITOR) 20  MG tablet TAKE 1 TABLET BY MOUTH  DAILY  ? B-D 3CC LUER-LOK SYR 25GX1/2" 25G X 1-1/2" 3 ML MISC See admin instructions.  ? cetirizine (ZYRTEC) 10 MG tablet TAKE ONE TABLET BY MOUTH EVERY DAY (Patient taking differently: Take 10 mg by mouth at bedtime.)  ? Cholecalciferol (VITAMIN D3) 5000 UNITS CAPS Take 5,000 Units by mouth at  bedtime.  ? cyanocobalamin (,VITAMIN B-12,) 1000 MCG/ML injection Inject 1,000 mcg into the muscle every 30 (thirty) days.  ? diazepam (VALIUM) 5 MG tablet Take 1 tablet (5 mg total) by mouth 3 (three) times daily as needed. For severe vertigo  ? ibuprofen (ADVIL) 200 MG tablet Take 400 mg by mouth every 8 (eight) hours as needed (pain.).  ? levothyroxine (SYNTHROID) 75 MCG tablet TAKE 1 TABLET BY MOUTH  DAILY  ? linaclotide (LINZESS) 290 MCG CAPS capsule Take 1 capsule (290 mcg total) by mouth every other day. In the morning  ? meclizine (ANTIVERT) 25 MG tablet Take 1-2 tablets (25-50 mg total) by mouth 3 (three) times daily as needed. For vertigo (Patient taking differently: Take 25-50 mg by mouth 3 (three) times daily as needed (dizziness/vertigo).)  ? ondansetron (ZOFRAN-ODT) 4 MG disintegrating tablet Take 1 tablet (4 mg total) by mouth every 8 (eight) hours as needed for nausea or vomiting.  ? Syringe/Needle, Disp, 25G X 1-1/2" 1 ML MISC 1 each by Does not apply route every 30 (thirty) days.  ? valsartan-hydrochlorothiazide (DIOVAN HCT) 160-12.5 MG tablet Take 1 tablet by mouth daily. (Patient taking differently: Take 1 tablet by mouth every evening.)  ? ?Past Medical History:  ?Diagnosis Date  ? Allergy   ? Anemia   ? Celiac disease   ? COVID 09/25/2020  ? Diverticulitis   ? GERD (gastroesophageal reflux disease)   ? GIST (gastrointestinal stroma tumor), malignant, colon ([place])   ? Hyperlipidemia   ? Hypertension   ? Sleep disturbance   ? Thyroiditis, lymphocytic   ? surgery 2013  ? Vitamin B12 deficiency   ? unresponsive to oral replacement  ? ?Past Surgical History:  ?Procedure Laterality Date  ? COLONOSCOPY    ? ESOPHAGOGASTRODUODENOSCOPY  03/04  ? colon  ? ESOPHAGOGASTRODUODENOSCOPY N/A 02/15/2021  ? Procedure: ESOPHAGOGASTRODUODENOSCOPY (EGD);  Surgeon: Milus Banister, MD;  Location: [facility];  Service: Endoscopy;  Laterality: N/A;  ? EUS N/A 02/15/2021  ? Procedure: UPPER ENDOSCOPIC ULTRASOUND (EUS)  RADIAL;  Surgeon: Milus Banister, MD;  Location: [facility];  Service: Endoscopy;  Laterality: N/A;  ? FINE NEEDLE ASPIRATION N/A 02/15/2021  ? Procedure: FINE NEEDLE ASPIRATION (FNA) LINEAR;  Surgeon: Milus Banister, MD;  Location: [facility];  Service: Endoscopy;  Laterality: N/A;  ? Laryngeal implant  2/13  ? after damage during thyroidectomy  ? THYROIDECTOMY  12/12  ? Lymphocytic thyroiditis with goiter--Dr Rochel Brome  ? ?Social History  ? ?Social History Narrative  ? The patient is married and has 2 children.  ? She is employed in Librarian, academic at [employer]  ? Rare alcohol and never tobacco  ? ?family history includes Heart attack in her mother; Other in her father. ? ? ?Review of Systems ?As above ? ?Objective:  ? Physical Exam ?BP 136/76   Pulse 72   Ht 5' 6"  (1.676 m)   Wt 203 lb 9.6 oz (92.4 kg)   BMI 32.86 kg/m?  ?Abd obese, soft NT ? ?

## 2021-10-18 ENCOUNTER — Other Ambulatory Visit: Payer: Self-pay | Admitting: Internal Medicine

## 2021-10-19 LAB — HEPATIC FUNCTION PANEL
ALT: 25 IU/L (ref 0–32)
AST: 28 IU/L (ref 0–40)
Albumin: 4.6 g/dL (ref 3.8–4.8)
Alkaline Phosphatase: 87 IU/L (ref 44–121)
Bilirubin Total: 0.9 mg/dL (ref 0.0–1.2)
Bilirubin, Direct: 0.24 mg/dL (ref 0.00–0.40)
Total Protein: 6.7 g/dL (ref 6.0–8.5)

## 2021-10-24 ENCOUNTER — Other Ambulatory Visit: Payer: Self-pay | Admitting: Internal Medicine

## 2022-01-29 ENCOUNTER — Encounter: Payer: Self-pay | Admitting: Internal Medicine

## 2022-01-29 ENCOUNTER — Ambulatory Visit (INDEPENDENT_AMBULATORY_CARE_PROVIDER_SITE_OTHER): Payer: 59 | Admitting: Internal Medicine

## 2022-01-29 VITALS — BP 112/82 | HR 69 | Temp 97.2°F | Ht 65.25 in | Wt 203.0 lb

## 2022-01-29 DIAGNOSIS — I10 Essential (primary) hypertension: Secondary | ICD-10-CM

## 2022-01-29 DIAGNOSIS — E039 Hypothyroidism, unspecified: Secondary | ICD-10-CM

## 2022-01-29 DIAGNOSIS — C49A2 Gastrointestinal stromal tumor of stomach: Secondary | ICD-10-CM

## 2022-01-29 DIAGNOSIS — E2839 Other primary ovarian failure: Secondary | ICD-10-CM

## 2022-01-29 DIAGNOSIS — E538 Deficiency of other specified B group vitamins: Secondary | ICD-10-CM | POA: Diagnosis not present

## 2022-01-29 DIAGNOSIS — Z0000 Encounter for general adult medical examination without abnormal findings: Secondary | ICD-10-CM

## 2022-01-29 MED ORDER — SULFAMETHOXAZOLE-TRIMETHOPRIM 800-160 MG PO TABS: 1.0000 | ORAL_TABLET | Freq: Two times a day (BID) | ORAL | 1 refills | Status: DC

## 2022-01-29 NOTE — Assessment & Plan Note (Signed)
Healthy Stays active Due for mammogram Will check DEXA Had bivalent COVID vaccine--flu vaccine in the fall Consider colon 2031

## 2022-01-29 NOTE — Assessment & Plan Note (Signed)
BP Readings from Last 3 Encounters:  01/29/22 112/82  10/17/21 136/76  09/21/21 127/72   Good on valsartan/HCTZ

## 2022-01-29 NOTE — Assessment & Plan Note (Signed)
Will follow up with GI

## 2022-01-29 NOTE — Addendum Note (Signed)
Addended by: Viviana Simpler I on: 01/29/2022 11:13 AM   Modules accepted: Orders

## 2022-01-29 NOTE — Addendum Note (Signed)
Addended by: Carter Kitten on: 01/29/2022 11:25 AM   Modules accepted: Orders

## 2022-01-29 NOTE — Assessment & Plan Note (Signed)
Seems euthyroid  Will check labs

## 2022-01-29 NOTE — Progress Notes (Signed)
Subjective:    Patient ID: Sharon Shepherd, female    DOB: 23-Nov-1950, 71 y.o.   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 1 Inhaler 1   aspirin 81 MG chewable tablet Chew 81 mg by mouth every other day. In the evening     atorvastatin (LIPITOR) 20 MG tablet TAKE 1 TABLET BY MOUTH  DAILY 90 tablet 3   B-D 3CC LUER-LOK SYR 25GX1/2" 25G X 1-1/2" 3 ML MISC See admin instructions.     cetirizine (ZYRTEC) 10 MG tablet TAKE ONE TABLET BY MOUTH EVERY DAY (Patient taking differently: Take 10 mg by mouth at bedtime.) 100 tablet 4   Cholecalciferol (VITAMIN D3) 5000 UNITS CAPS Take 5,000 Units by mouth at bedtime.     cyanocobalamin (,VITAMIN B-12,) 1000 MCG/ML injection Inject 1,000 mcg into the muscle every 30 (thirty) days.     diazepam (VALIUM) 5 MG tablet Take 1 tablet (5 mg total) by mouth 3 (three) times daily as needed. For severe vertigo 30 tablet 0   ibuprofen (ADVIL) 200 MG tablet Take 400 mg by mouth every 8 (eight) hours as needed (pain.).     levothyroxine (SYNTHROID) 75 MCG tablet TAKE 1 TABLET BY MOUTH  DAILY 90 tablet 3   LINZESS 290 MCG CAPS capsule TAKE ONE CAPSULE BY MOUTH EVERY OTHER DAY IN THE MORNING. 90 capsule 1   meclizine (ANTIVERT) 25 MG tablet Take 1-2 tablets (25-50 mg total) by mouth 3 (three) times daily as needed. For vertigo (Patient taking differently: Take 25-50 mg by mouth 3 (three) times daily as needed (dizziness/vertigo).) 60 tablet 0    Syringe/Needle, Disp, 25G X 1-1/2" 1 ML MISC 1 each by Does not apply route every 30 (thirty) days. 1 each 11   valsartan-hydrochlorothiazide (DIOVAN-HCT) 160-12.5 MG tablet TAKE 1 TABLET BY MOUTH  DAILY 90 tablet 3   No current facility-administered medications on file prior to visit.    Allergies  Allergen Reactions   Codeine Itching    Past Medical History:  Diagnosis Date   Allergy    Anemia    Celiac disease    COVID 09/25/2020   Diverticulitis    GERD (gastroesophageal reflux disease)    GIST (gastrointestinal stroma tumor), malignant, colon ([place])    Hyperlipidemia    Hypertension    Sleep disturbance    Thyroiditis, lymphocytic    surgery 2013   Vitamin B12 deficiency    unresponsive to oral replacement    Past Surgical History:  Procedure Laterality Date   COLONOSCOPY     ESOPHAGOGASTRODUODENOSCOPY  03/04   colon   ESOPHAGOGASTRODUODENOSCOPY N/A 02/15/2021   Procedure: ESOPHAGOGASTRODUODENOSCOPY (EGD);  Surgeon: Milus Banister, MD;  Location: [facility];  Service: Endoscopy;  Laterality: N/A;   EUS N/A 02/15/2021   Procedure: UPPER ENDOSCOPIC ULTRASOUND (EUS) RADIAL;  Surgeon: Milus Banister, MD;  Location: [facility];  Service: Endoscopy;  Laterality: N/A;   FINE NEEDLE ASPIRATION N/A 02/15/2021   Procedure: FINE NEEDLE ASPIRATION (  FNA) LINEAR;  Surgeon: Milus Banister, MD;  Location: [facility];  Service: Endoscopy;  Laterality: N/A;   Laryngeal implant  2/13   after damage during thyroidectomy   THYROIDECTOMY  12/12   Lymphocytic thyroiditis with goiter--Dr Rochel Brome    Family History  Problem Relation Age of Onset   Other Father        pancreatic absess   Heart attack Mother    Colon cancer Neg Hx    Breast cancer Neg Hx    Esophageal cancer Neg Hx    Stomach cancer Neg Hx    Rectal cancer Neg Hx     Social History   Socioeconomic History   Marital status: Married    Spouse name: Not on file   Number of children: 2   Years of  education: Not on file   Highest education level: Not on file  Occupational History   Occupation: Building control surveyor: [employer]  Tobacco Use   Smoking status: Never    Passive exposure: Past (as a child)   Smokeless tobacco: Never  Vaping Use   Vaping Use: Never used  Substance and Sexual Activity   Alcohol use: Yes    Comment: rare   Drug use: No   Sexual activity: Not on file  Other Topics Concern   Not on file  Social History Narrative   The patient is married and has 2 children.   She is employed in Librarian, academic at [employer]   Rare alcohol and never tobacco   Social Determinants of Radio broadcast assistant Strain: Not on file  Food Insecurity: Not on file  Transportation Needs: Not on file  Physical Activity: Not on file  Stress: Not on file  Social Connections: Not on file  Intimate Partner Violence: Not on file   Review of Systems  Constitutional:  Negative for fatigue and unexpected weight change.       Wears seat belt Stays active with gardening (1/2 acre) and walking  HENT:  Negative for dental problem, hearing loss and tinnitus.        Keeps up with dentist  Eyes:  Negative for visual disturbance.       No diplopia or unilateral vision loss  Respiratory:  Negative for cough, chest tightness and shortness of breath.   Cardiovascular:  Positive for palpitations. Negative for chest pain and leg swelling.  Gastrointestinal:  Negative for blood in stool and constipation.       No heartburn  Endocrine: Negative for polydipsia and polyuria.  Genitourinary:  Negative for dyspareunia.       Has twinge with her bladder --slight urgency  Musculoskeletal:  Negative for arthralgias and joint swelling.       Some pain in right butt---can't sleep on the right side. Some weakness--like on steps  Skin:  Negative for rash.  Allergic/Immunologic: Positive for environmental allergies. Negative for immunocompromised state.       Zyrtec helps  Neurological:  Negative  for dizziness, syncope, light-headedness and headaches.  Hematological:  Negative for adenopathy. Does not bruise/bleed easily.  Psychiatric/Behavioral:  Negative for dysphoric mood. The patient is not nervous/anxious.        Chronic sleep issues       Objective:   Physical Exam         Assessment & Plan:

## 2022-01-30 ENCOUNTER — Other Ambulatory Visit: Payer: Self-pay | Admitting: Internal Medicine

## 2022-01-30 ENCOUNTER — Telehealth: Payer: Self-pay

## 2022-01-30 DIAGNOSIS — E039 Hypothyroidism, unspecified: Secondary | ICD-10-CM

## 2022-01-30 DIAGNOSIS — Z1231 Encounter for screening mammogram for malignant neoplasm of breast: Secondary | ICD-10-CM

## 2022-01-30 LAB — VITAMIN B12: Vitamin B-12: 385 pg/mL (ref 232–1245)

## 2022-01-30 LAB — LIPID PANEL
Chol/HDL Ratio: 2.8 ratio (ref 0.0–4.4)
Cholesterol, Total: 146 mg/dL (ref 100–199)
HDL: 53 mg/dL (ref >39)
LDL Chol Calc (NIH): 75 mg/dL (ref 0–99)
Triglycerides: 99 mg/dL (ref 0–149)
VLDL Cholesterol Cal: 18 mg/dL (ref 5–40)

## 2022-01-30 LAB — CBC
Hematocrit: 41.6 % (ref 34.0–46.6)
Hemoglobin: 13.9 g/dL (ref 11.1–15.9)
MCH: 28 pg (ref 26.6–33.0)
MCHC: 33.4 g/dL (ref 31.5–35.7)
MCV: 84 fL (ref 79–97)
Platelets: 180 10*3/uL (ref 150–450)
RBC: 4.97 x10E6/uL (ref 3.77–5.28)
RDW: 13.1 % (ref 11.7–15.4)
WBC: 4.5 10*3/uL (ref 3.4–10.8)

## 2022-01-30 LAB — TSH: TSH: 0.191 u[IU]/mL — ABNORMAL LOW (ref 0.450–4.500)

## 2022-01-30 LAB — T4, FREE: Free T4: 1.48 ng/dL (ref 0.82–1.77)

## 2022-01-30 NOTE — Telephone Encounter (Signed)
Tried to call pt back. No answer and no VM again. If pt calls back, please Teams me or call my phone. Thanks.

## 2022-01-30 NOTE — Telephone Encounter (Signed)
Per Dr Silvio Pate: Results released I would like to reduce her levothyroxine to 50 mcg daily Confirm with her and send new Rx Repeat tests in 2 months  Tried to call pt. No answer and no vm to leave a message. Pt did see the results on MyChart.

## 2022-01-30 NOTE — Telephone Encounter (Signed)
Patient returned call

## 2022-02-01 MED ORDER — LEVOTHYROXINE SODIUM 50 MCG PO TABS: 50.0000 ug | ORAL_TABLET | Freq: Every day | ORAL | 0 refills | Status: DC

## 2022-02-15 ENCOUNTER — Other Ambulatory Visit: Payer: Self-pay | Admitting: Internal Medicine

## 2022-03-08 ENCOUNTER — Encounter: Payer: Self-pay | Admitting: Internal Medicine

## 2022-03-11 IMAGING — CT CT CERVICAL SPINE W/O CM
3 of 4 series · 9 of 33 positions shown, 11 images · non-contrast
Comparison: None.

CLINICAL DATA: Syncope and fall

EXAM:
CT HEAD WITHOUT CONTRAST
CT CERVICAL SPINE WITHOUT CONTRAST
TECHNIQUE: Multidetector CT imaging of the head and cervical spine was
performed following the standard protocol without intravenous
contrast. Multiplanar CT image reconstructions of the cervical spine
were also generated.

[Series 6: sagittal bone · sagittal · 0.21mm/px · 5 of 54 slices shown, 6 images]
[im 18/54  bone]
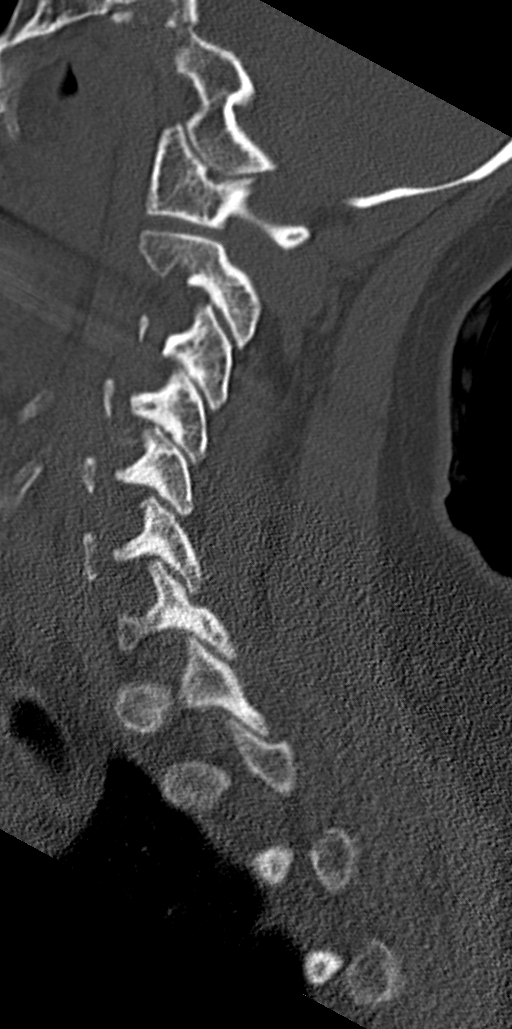
[im 23/54  bone]
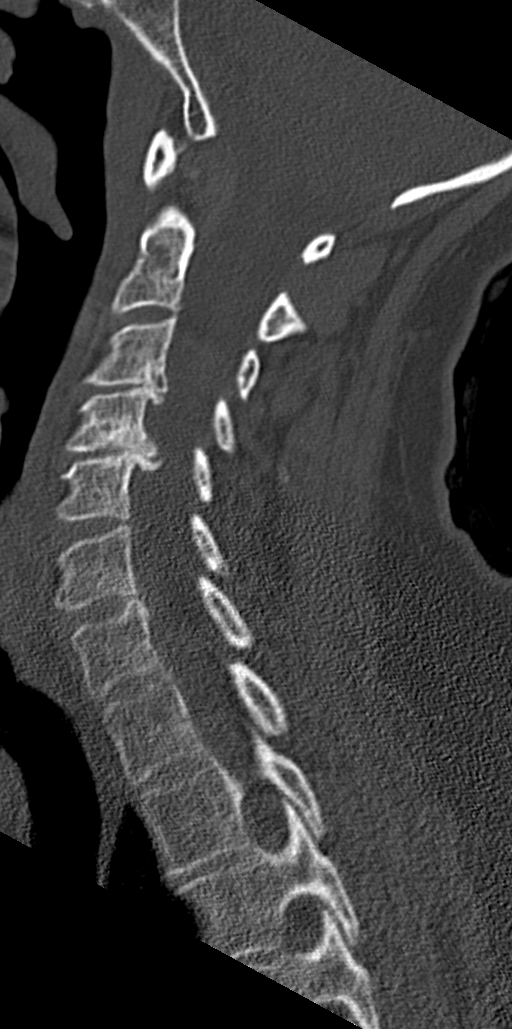
[im 27/54  soft-tissue]
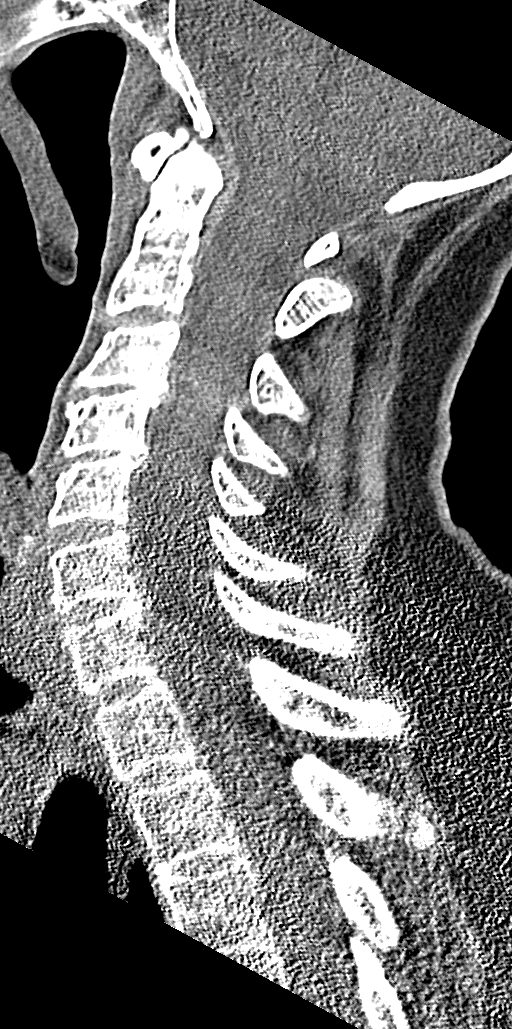
[im 27/54  bone]
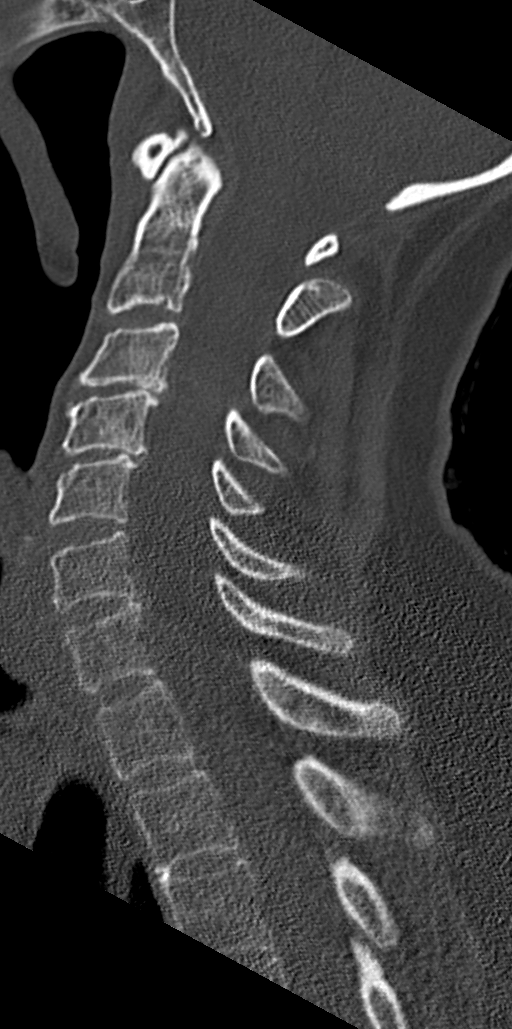
[im 31/54  bone]
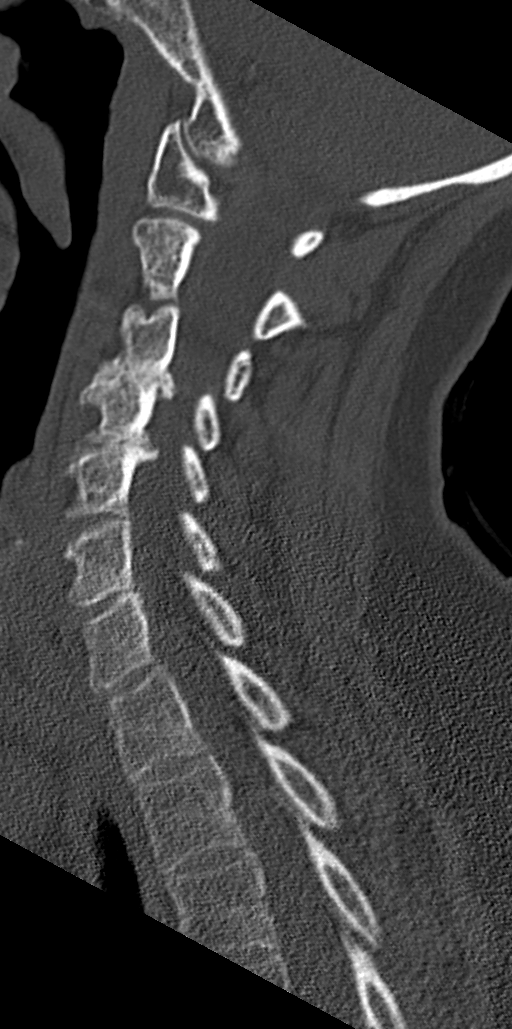
[im 36/54  bone]
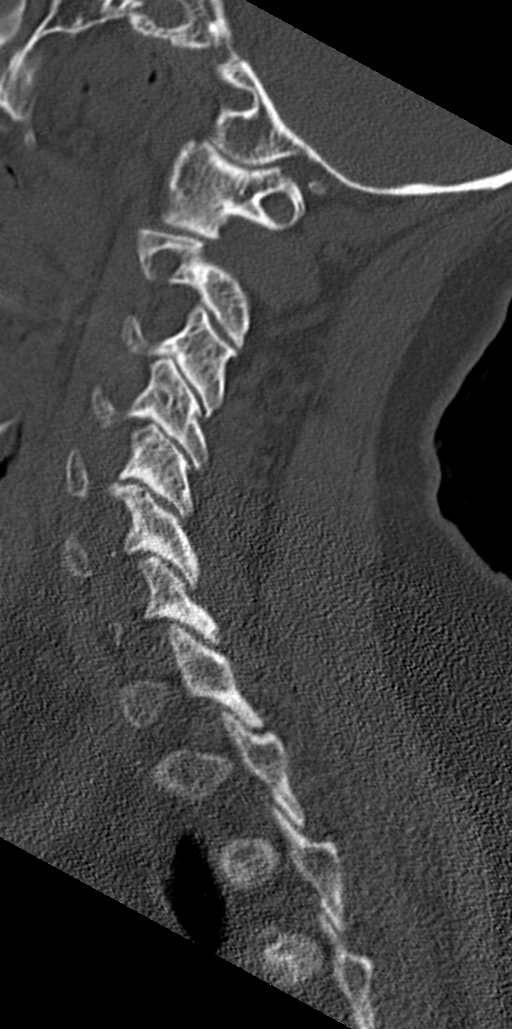

[Series 7: coronal bone · coronal · 0.21mm/px · 3 of 53 slices shown]
[im 11/53  bone]
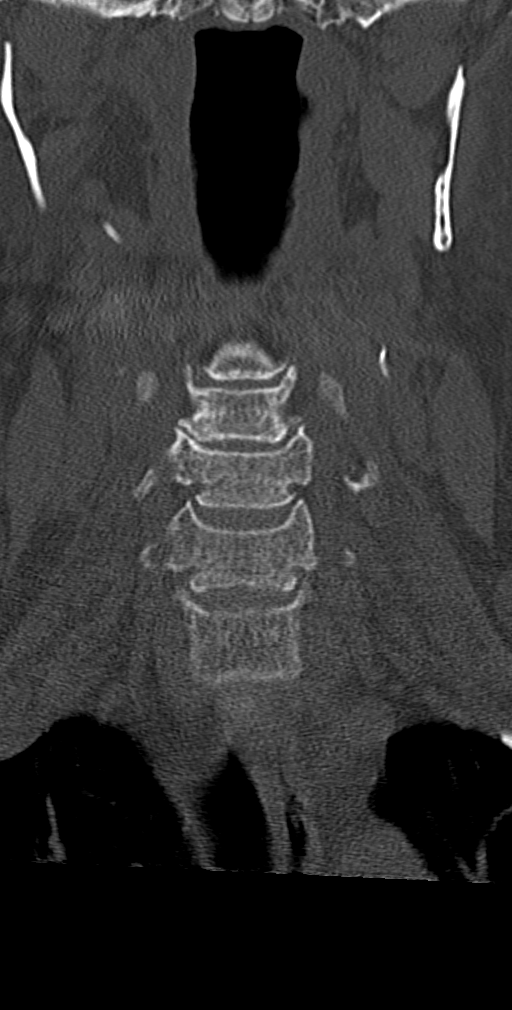
[im 21/53  bone]
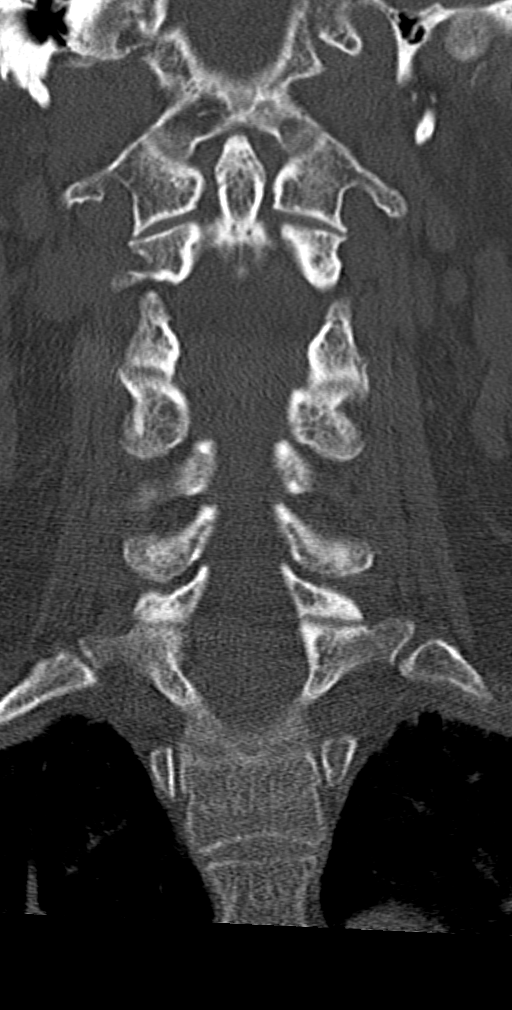
[im 32/53  bone]
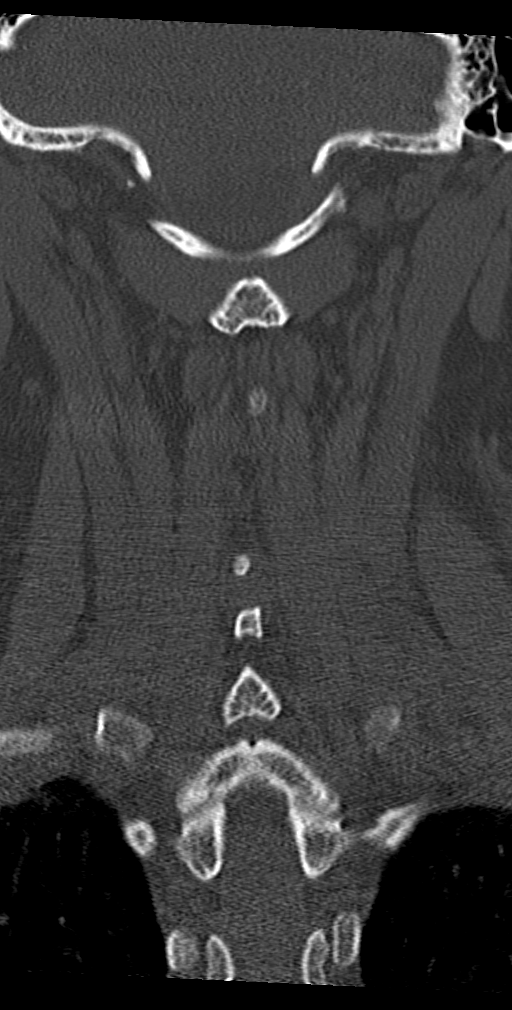

[Series 8: orthogonal bone · axial · 0.21mm/px · z∈[-254,-254]mm · 1 of 106 slices shown, 2 images]
[im 61/106  soft-tissue]
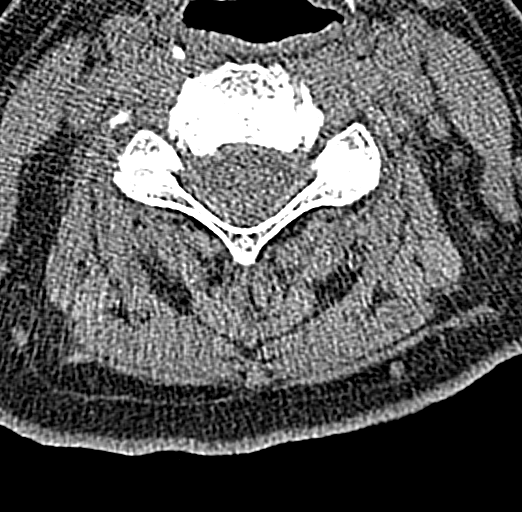
[im 61/106  bone]
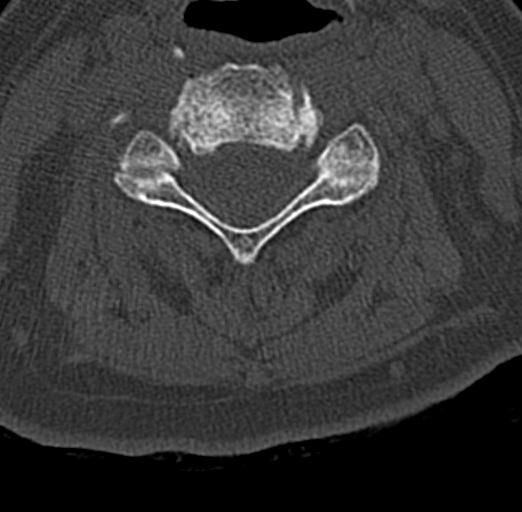

[9 of 33 positions shown; findings below may reference images not displayed]

FINDINGS: CT HEAD FINDINGS

Brain: No evidence of acute infarction, hemorrhage, hydrocephalus,
extra-axial collection or mass lesion/mass effect.

Vascular: No hyperdense vessel or unexpected calcification.

Skull: Normal. Negative for fracture or focal lesion. Right scalp
wound with soft tissue gas.

Sinuses/Orbits: No evidence of injury. Atelectatic and opacified
left maxillary sinus, partially covered.

CT CERVICAL SPINE FINDINGS

Alignment: Normal.

Skull base and vertebrae: No acute fracture. No primary bone lesion
or focal pathologic process.

Soft tissues and spinal canal: No prevertebral fluid or swelling. No
visible canal hematoma. Implant at the right vocal fold. Reportedly
there was a prior recurrent laryngeal nerve injury at thyroidectomy.

Disc levels: C3-4 and C4-5 disc degeneration with uncovertebral
spurs encroaching on the foramina.

Upper chest: No evidence of injury.
IMPRESSION: 1. No evidence of acute intracranial or cervical spine injury.
2. Right scalp wound without calvarial fracture.
3. Chronic left maxillary sinusitis.

## 2022-03-14 ENCOUNTER — Telehealth: Payer: Self-pay | Admitting: Internal Medicine

## 2022-03-14 NOTE — Telephone Encounter (Signed)
Patient called and said she usually has her stomach tumor looked at about every 6 months with an EGD.  She said last time instead of the EGD she had a CT scan done.  She is asking if she can have a CT scan scheduled instead of doing the EGD this time.  Please call patient and advise.  Thank you.

## 2022-03-14 NOTE — Telephone Encounter (Signed)
Pt questioned if she could have a CT scan done instead of doing an EGD: Chart reviewed: Per office note Dr. Carlean Purl recommendation was to see pt in 6 months: Pt made aware Pt was scheduled for an office visit with Dr. Carlean Purl on 04/26/2022 at 8:50 AM: Pt made aware: Pt verbalized understanding with all questions answered.

## 2022-03-21 ENCOUNTER — Ambulatory Visit: Payer: 59 | Admitting: Family Medicine

## 2022-03-21 ENCOUNTER — Encounter: Payer: Self-pay | Admitting: Family Medicine

## 2022-03-21 VITALS — BP 100/70 | HR 77 | Temp 97.5°F | Ht 65.25 in | Wt 206.1 lb

## 2022-03-21 DIAGNOSIS — M5431 Sciatica, right side: Secondary | ICD-10-CM

## 2022-03-21 DIAGNOSIS — M19049 Primary osteoarthritis, unspecified hand: Secondary | ICD-10-CM | POA: Diagnosis not present

## 2022-03-21 DIAGNOSIS — M2550 Pain in unspecified joint: Secondary | ICD-10-CM

## 2022-03-21 DIAGNOSIS — M65341 Trigger finger, right ring finger: Secondary | ICD-10-CM | POA: Diagnosis not present

## 2022-03-21 MED ORDER — TRIAMCINOLONE ACETONIDE 40 MG/ML IJ SUSP
20.0000 mg | Freq: Once | INTRAMUSCULAR | Status: AC
  Administered 2022-03-21: 20 mg via INTRA_ARTICULAR

## 2022-03-21 NOTE — Progress Notes (Signed)
Kaisyn Reinhold T. Yilia Sacca, MD, [place] at [facility] [place], [address]  Phone: [phone]  FAX: [phone]  Sharon Shepherd - 71 y.o. female  MRN [id number]  Date of Birth: 16-Mar-1951  Date: 03/21/2022  PCP: [name], MD  Referral: [name], MD  Chief Complaint  Patient presents with   Trigger Finger    Right Ring Finger   Sciatica   Subjective:   Sharon Shepherd is a 71 y.o. very pleasant female patient with Body mass index is 34.04 kg/m. who presents with the following:  Patient presents with multiple different musculoskeletal complaints.  1.  Painful right-sided fourth digit trigger finger.  This catches in the morning and she has to manually unstick it and will sometimes get pain in the flexor tendon.  2.  Bilateral multijoint hand pain and swelling.  She will often have aching pain first thing in the morning lasting 2 to 3 hours.  At times she will have swelling in her fingers and difficulty making a full composite fist.  She does have multiple family members with both rheumatoid arthritis as well as a sister with psoriatic arthritis.  Hands will ache for a couple of hours.  Mom and MGM had RA. Sister with psoriatic arthritis.  3.  Regular the left back pain with pain in the buttocks and with occasional pain radiating down the right leg.  This is longstanding and has been present for multiple months.  I did independently review the patient's CT abdomen and pelvis and the bone windows to review the the lumbar spine.  Films viewed September 21, 2021.  Patient does have mild degree of multilevel degenerative disc disease as well as spondyloarthropathy.  Review of Systems is noted in the HPI, as appropriate  Objective:   BP 100/70   Pulse 77   Temp (!) 97.5 F (36.4 C) (Oral)   Ht 5' 5.25" (1.657 m)   Wt 206 lb 2 oz (93.5 kg)   SpO2 96%   BMI 34.04 kg/m   GEN: No acute  distress; alert,appropriate. PULM: Breathing comfortably in no respiratory distress PSYCH: Normally interactive.   Obvious right-sided fourth digit trigger finger with a nodule present just proximal to the A1 pulley.  Multiple tender joints in the DIP and PIP joints and bilateral hand.  I do not appreciate any true synovitis.   Range of motion at  the waist: Flexion: normal Extension: normal Lateral bending: normal Rotation: all normal  No echymosis or edema Rises to examination table with no difficulty Gait: non antalgic  Inspection/Deformity: N Paraspinus Tenderness: L4-s1, R > L  B Ankle Dorsiflexion (L5,4): 5/5 B Great Toe Dorsiflexion (L5,4): 5/5 Heel Walk (L5): WNL Toe Walk (S1): WNL Rise/Squat (L4): WNL  SENSORY B Medial Foot (L4): WNL B Dorsum (L5): WNL B Lateral (S1): WNL Light Touch: WNL  B SLR, seated: neg B SLR, supine: neg B FABER: neg B Reverse FABER: neg B Greater Troch: NT B Log Roll: neg B Sciatic Notch: NT   Hip flexion 4/5 bilaterally Hip abduction 4 -/5 on the right and 4+/5 on the left Hip abduction 4+/5 bilaterally  Laboratory and Imaging Data:  Assessment and Plan:     ICD-10-CM   1. Trigger finger, right ring finger  M65.341 triamcinolone acetonide (KENALOG-40) injection 20 mg    2. Polyarthralgia  M25.50 Sedimentation rate    C-reactive protein    ANA,IFA RA Diag Pnl w/rflx  Tit/Patn    3. Hand arthritis  M19.049 Sedimentation rate    C-reactive protein    ANA,IFA RA Diag Pnl w/rflx Tit/Patn    4. Right sided sciatica  M54.31      We discussed the pathophysiology of trigger fingers. Discussed the inflammatory nature of nodule creation and likely nodule abutting the A1 pulley system, this causing the patient's discomfort and sensations. We discussed that treatments for this include direct injection into the tendon sheath to attempt to shrink catching tissue. This can be done 1-2 times. Other treatments include surgical release. If  the patient fails to trigger finger injections, I would recommend trigger finger release if the patient desires relief of the symptoms.   Hand pain and stiffness greater than 2 hours in the morning with multiple joints involved in the hand and a strong family history of rheumatological disease.  I will check a CRP, sed rate as well as a rheumatological panel.  Longstanding back pain, multilevel degenerative disc disease and intermittent sciatica is a lot more challenging.  She is focally weak at her hips, and I think that doing some hip strengthening and basic rehab would help.  A rehabilitation program from the [employer] was reviewed with the patient face to face for their condition.   Tendon Sheath Injection Procedure Note Bryna Razavi 08-07-1951 Date of procedure: 03/21/2022  Procedure: Tendon Sheath Injection for Trigger Finger, R 4th Indications: Pain  Procedure Details Verbal consent was obtained. Risks (including potential risk for skin lightening and potential atrophy), benefits and alternatives were discussed. Prepped with Chloraprep and Ethyl Chloride used for anesthesia. Under sterile conditions, patient injected at palmar crease aiming distally with 45 degree angle towards nodule; injected directly into tendon sheath. Medication flowed freely without resistance.  Needle size: 22 gauge 1 1/2 inch Injection: 1/2 cc of Lidocaine 1% and Kenalog 20 mg Medication: 1/2 cc of Kenalog 40 mg (equaling Kenalog 20 mg)   Medication Management during today's office visit: Meds ordered this encounter  Medications   triamcinolone acetonide (KENALOG-40) injection 20 mg   Medications Discontinued During This Encounter  Medication Reason   sulfamethoxazole-trimethoprim (BACTRIM DS) 800-160 MG tablet Completed Course    Orders placed today for conditions managed today: Orders Placed This Encounter  Procedures   Sedimentation rate   C-reactive protein    ANA,IFA RA Diag Pnl w/rflx Tit/Patn    Follow-up if needed: No follow-ups on file.  Dragon [employer] One speech-to-text software was used for transcription in this dictation.  Possible transcriptional errors can occur using Editor, commissioning.   Signed,  M[place] Deed. Marvelle Span, MD   Outpatient Encounter Medications as of 03/21/2022  Medication Sig   albuterol (PROVENTIL HFA;VENTOLIN HFA) 108 (90 Base) MCG/ACT inhaler Inhale 2 puffs into the lungs every 6 (six) hours as needed for wheezing or shortness of breath.   aspirin 81 MG chewable tablet Chew 81 mg by mouth every other day. In the evening   atorvastatin (LIPITOR) 20 MG tablet TAKE 1 TABLET BY MOUTH  DAILY   B-D 3CC LUER-LOK SYR 25GX1/2" 25G X 1-1/2" 3 ML MISC See admin instructions.   cetirizine (ZYRTEC) 10 MG tablet TAKE ONE TABLET BY MOUTH EVERY DAY   Cholecalciferol (VITAMIN D3) 5000 UNITS CAPS Take 5,000 Units by mouth at bedtime.   cyanocobalamin (,VITAMIN B-12,) 1000 MCG/ML injection Inject 1,000 mcg into the muscle every 30 (thirty) days.   diazepam (VALIUM) 5 MG tablet Take 1 tablet (5 mg total) by mouth  3 (three) times daily as needed. For severe vertigo   ibuprofen (ADVIL) 200 MG tablet Take 400 mg by mouth every 8 (eight) hours as needed (pain.).   levothyroxine (SYNTHROID) 50 MCG tablet Take 1 tablet (50 mcg total) by mouth daily.   LINZESS 290 MCG CAPS capsule TAKE ONE CAPSULE BY MOUTH EVERY OTHER DAY IN THE MORNING.   meclizine (ANTIVERT) 25 MG tablet Take 1-2 tablets (25-50 mg total) by mouth 3 (three) times daily as needed. For vertigo (Patient taking differently: Take 25-50 mg by mouth 3 (three) times daily as needed (dizziness/vertigo).)   Syringe/Needle, Disp, 25G X 1-1/2" 1 ML MISC 1 each by Does not apply route every 30 (thirty) days.   valsartan-hydrochlorothiazide (DIOVAN-HCT) 160-12.5 MG tablet TAKE 1 TABLET BY MOUTH  DAILY   [DISCONTINUED] sulfamethoxazole-trimethoprim (BACTRIM DS) 800-160 MG tablet Take 1 tablet by  mouth 2 (two) times daily. For urinary symptoms   [EXPIRED] triamcinolone acetonide (KENALOG-40) injection 20 mg    No facility-administered encounter medications on file as of 03/21/2022.

## 2022-03-23 LAB — ANA,IFA RA DIAG PNL W/RFLX TIT/PATN
ANA Titer 1: NEGATIVE
Cyclic Citrullin Peptide Ab: 5 units (ref 0–19)
Rheumatoid fact SerPl-aCnc: <10 IU/mL (ref <14.0)

## 2022-03-23 LAB — C-REACTIVE PROTEIN: CRP: 2 mg/L (ref 0–10)

## 2022-03-23 LAB — SEDIMENTATION RATE: Sed Rate: 2 mm/hr (ref 0–40)

## 2022-03-28 ENCOUNTER — Other Ambulatory Visit: Payer: Self-pay | Admitting: Internal Medicine

## 2022-04-02 ENCOUNTER — Ambulatory Visit
Admission: RE | Admit: 2022-04-02 | Discharge: 2022-04-02 | Disposition: A | Discharge status: Home / Self Care | Payer: 59 | Source: Ambulatory Visit | Attending: Internal Medicine | Admitting: Internal Medicine

## 2022-04-02 DIAGNOSIS — Z1231 Encounter for screening mammogram for malignant neoplasm of breast: Secondary | ICD-10-CM | POA: Insufficient documentation

## 2022-04-02 DIAGNOSIS — E2839 Other primary ovarian failure: Secondary | ICD-10-CM | POA: Diagnosis present

## 2022-04-24 ENCOUNTER — Ambulatory Visit: Payer: 59 | Admitting: Family Medicine

## 2022-04-24 ENCOUNTER — Encounter: Payer: Self-pay | Admitting: Family Medicine

## 2022-04-24 VITALS — BP 120/84 | HR 65 | Temp 97.5°F | Ht 65.25 in | Wt 206.4 lb

## 2022-04-24 DIAGNOSIS — M25562 Pain in left knee: Secondary | ICD-10-CM | POA: Diagnosis not present

## 2022-04-24 NOTE — Progress Notes (Signed)
Geordie Nooney T. Jerni Selmer, MD, [place] at [facility] [place], [address]  Phone: [phone]  FAX: [phone]  Sharon Shepherd - 71 y.o. female  MRN [id number]  Date of Birth: March 05, 1951  Date: 04/24/2022  PCP: [name], MD  Referral: [name], MD  Chief Complaint  Patient presents with   Knee Pain   Leg Pain    Lower leg-seen at [facility] 04/20/22   Subjective:   Sharon Shepherd is a 71 y.o. very pleasant female patient with Body mass index is 34.08 kg/m. who presents with the following:  Patient presents with L leg pain:   I actually saw her recently for a trigger finger on the right fourth digit.  At that point I did inject her trigger finger.  At that point, she was also having polyarthropathy, and I did do a basic rheumatological work-up. She had a negative CRP, sed rate, CCP antibodies, rheumatoid factor, and negative ANA.  Stepped and hurt her L leg, and walking, popped and could not put any weight at all.  More on the lateral knee.    Friday night, pain, stepped on Wed, had a lot of pain. 900% better compared to Friday.  She also has some pain in the proximal lateral calf on that side as well.  Now she is walking, she went to [facility] last weekend, and she is walking much better, wearing a brace, and using a cane.  Review of Systems is noted in the HPI, as appropriate  Objective:   BP 120/84   Pulse 65   Temp (!) 97.5 F (36.4 C) (Oral)   Ht 5' 5.25" (1.657 m)   Wt 206 lb 6 oz (93.6 kg)   SpO2 96%   BMI 34.08 kg/m   GEN: No acute distress; alert,appropriate. PULM: Breathing comfortably in no respiratory distress PSYCH: Normally interactive.   Left knee: No effusion.  Nontender to loading the medial lateral patellar facet. Stable to varus and valgus stress.  No pain with stress of the ligaments.  ACL and PCL are intact. She does have some medial and  lateral joint line tenderness.  Large popliteal cyst. The cyst is not new Flexion pinch and McMurray's causes minimal pain.  Laboratory and Imaging Data:  Assessment and Plan:     ICD-10-CM   1. Acute pain of left knee  M25.562       Anticipate minor meniscal tear, she is already improving.  At this point I would do nothing aside from continuing her knee brace, basic range of motion, walking.  I think she can ice and use of NSAIDs as needed.  Disposition: No follow-ups on file.  Dragon [employer] One speech-to-text software was used for transcription in this dictation.  Possible transcriptional errors can occur using Editor, commissioning.   Signed,  [place] Deed. Albert Devaul, MD   Outpatient Encounter Medications as of 04/24/2022  Medication Sig   albuterol (PROVENTIL HFA;VENTOLIN HFA) 108 (90 Base) MCG/ACT inhaler Inhale 2 puffs into the lungs every 6 (six) hours as needed for wheezing or shortness of breath.   aspirin 81 MG chewable tablet Chew 81 mg by mouth every other day. In the evening   atorvastatin (LIPITOR) 20 MG tablet TAKE 1 TABLET BY MOUTH  DAILY   B-D 3CC LUER-LOK SYR 25GX1/2" 25G X 1-1/2" 3 ML MISC See admin instructions.   cetirizine (ZYRTEC) 10 MG tablet TAKE ONE TABLET BY MOUTH EVERY DAY  Cholecalciferol (VITAMIN D3) 5000 UNITS CAPS Take 5,000 Units by mouth at bedtime.   cyanocobalamin (,VITAMIN B-12,) 1000 MCG/ML injection Inject 1,000 mcg into the muscle every 30 (thirty) days.   diazepam (VALIUM) 5 MG tablet Take 1 tablet (5 mg total) by mouth 3 (three) times daily as needed. For severe vertigo   ibuprofen (ADVIL) 200 MG tablet Take 400 mg by mouth every 8 (eight) hours as needed (pain.).   levothyroxine (SYNTHROID) 50 MCG tablet TAKE 1 TABLET BY MOUTH DAILY   LINZESS 290 MCG CAPS capsule TAKE ONE CAPSULE BY MOUTH EVERY OTHER DAY IN THE MORNING.   meclizine (ANTIVERT) 25 MG tablet Take 1-2 tablets (25-50 mg total) by mouth 3 (three) times daily as needed. For vertigo  (Patient taking differently: Take 25-50 mg by mouth 3 (three) times daily as needed (dizziness/vertigo).)   Syringe/Needle, Disp, 25G X 1-1/2" 1 ML MISC 1 each by Does not apply route every 30 (thirty) days.   valsartan-hydrochlorothiazide (DIOVAN-HCT) 160-12.5 MG tablet TAKE 1 TABLET BY MOUTH  DAILY   No facility-administered encounter medications on file as of 04/24/2022.

## 2022-04-26 ENCOUNTER — Encounter: Payer: Self-pay | Admitting: Internal Medicine

## 2022-04-26 ENCOUNTER — Ambulatory Visit: Payer: 59 | Admitting: Internal Medicine

## 2022-04-26 VITALS — BP 130/74 | HR 67 | Ht 66.0 in | Wt 206.0 lb

## 2022-04-26 DIAGNOSIS — Z8719 Personal history of other diseases of the digestive system: Secondary | ICD-10-CM | POA: Diagnosis not present

## 2022-04-26 DIAGNOSIS — C49A2 Gastrointestinal stromal tumor of stomach: Secondary | ICD-10-CM

## 2022-04-26 NOTE — Progress Notes (Signed)
Sharon Shepherd 71 y.o. [birth year] [id number]  Assessment & Plan:   Encounter Diagnoses  Name Primary?   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 Rush Landmark regarding scheduling of follow-up EUS, it seems appropriate to perform surveillance of her just checking for changes/progression.    Subjective:   Chief Complaint:  HPI 71 year old white woman with a history of diverticulitis, iron deficiency anemia and a small GIST of the stomach previously evaluated with EUS and CT scanning.  Here for follow-up to determine next steps in surveillance of her GIST.  Iron deficiency anemia work-up with EGD and colonoscopy in May 2022 revealed a small submucosal nodule in the stomach.  This led to upper endoscopic ultrasound by Dr. Ardis Hughs in July 2022 showing an approximately 1 cm subepithelial lesion along the posterior wall of the gastric body, and on EUS examination 1.6 cm maximum diameter consistent with a small calcified GIST.  FNA showed it to be a spindle cell neoplasm.  Staining consistent with GIST.  Patient was quite anxious about this and saw oncology, she had CT of the abdomen and pelvis without signs of metastases and it measured the lesion at 0.9 x 0.8 cm.  She contemplated but did not follow through with surgical evaluation.  This was in August 2022.  She had diverticulitis in February of this year and the stomach lesion was the same size.  She is not having any abdominal pain or GI symptoms right now.  Nor has she since the diverticulitis.  She is having left knee pain and cartilage issues there but no surgery is planned.  Husband is with her today.  Allergies  Allergen Reactions   Codeine Itching   Current Meds  Medication Sig   albuterol (PROVENTIL HFA;VENTOLIN HFA) 108 (90 Base) MCG/ACT inhaler Inhale 2 puffs into the lungs every 6 (six) hours as needed for wheezing or shortness of breath.    aspirin 81 MG chewable tablet Chew 81 mg by mouth every other day. In the evening   atorvastatin (LIPITOR) 20 MG tablet TAKE 1 TABLET BY MOUTH  DAILY   B-D 3CC LUER-LOK SYR 25GX1/2" 25G X 1-1/2" 3 ML MISC See admin instructions.   cetirizine (ZYRTEC) 10 MG tablet TAKE ONE TABLET BY MOUTH EVERY DAY   Cholecalciferol (VITAMIN D3) 5000 UNITS CAPS Take 5,000 Units by mouth at bedtime.   cyanocobalamin (,VITAMIN B-12,) 1000 MCG/ML injection Inject 1,000 mcg into the muscle every 30 (thirty) days.   diazepam (VALIUM) 5 MG tablet Take 1 tablet (5 mg total) by mouth 3 (three) times daily as needed. For severe vertigo   ibuprofen (ADVIL) 200 MG tablet Take 400 mg by mouth every 8 (eight) hours as needed (pain.).   levothyroxine (SYNTHROID) 50 MCG tablet TAKE 1 TABLET BY MOUTH DAILY   LINZESS 290 MCG CAPS capsule TAKE ONE CAPSULE BY MOUTH EVERY OTHER DAY IN THE MORNING.   meclizine (ANTIVERT) 25 MG tablet Take 1-2 tablets (25-50 mg total) by mouth 3 (three) times daily as needed. For vertigo (Patient taking differently: Take 25-50 mg by mouth 3 (three) times daily as needed (dizziness/vertigo).)   Syringe/Needle, Disp, 25G X 1-1/2" 1 ML MISC 1 each by Does not apply route every 30 (thirty) days.   valsartan-hydrochlorothiazide (DIOVAN-HCT) 160-12.5 MG tablet TAKE 1 TABLET BY MOUTH  DAILY   Past Medical History:  Diagnosis Date   Allergy    Anemia    Celiac  disease    COVID 09/25/2020   Diverticulitis    GERD (gastroesophageal reflux disease)    GIST (gastrointestinal stroma tumor), malignant, colon ([place])    Hyperlipidemia    Hypertension    Sleep disturbance    Thyroiditis, lymphocytic    surgery 2013   Vitamin B12 deficiency    unresponsive to oral replacement   Past Surgical History:  Procedure Laterality Date   COLONOSCOPY     ESOPHAGOGASTRODUODENOSCOPY  03/04   colon   ESOPHAGOGASTRODUODENOSCOPY N/A 02/15/2021   Procedure: ESOPHAGOGASTRODUODENOSCOPY (EGD);  Surgeon: Milus Banister, MD;   Location: [facility];  Service: Endoscopy;  Laterality: N/A;   EUS N/A 02/15/2021   Procedure: UPPER ENDOSCOPIC ULTRASOUND (EUS) RADIAL;  Surgeon: Milus Banister, MD;  Location: [facility];  Service: Endoscopy;  Laterality: N/A;   FINE NEEDLE ASPIRATION N/A 02/15/2021   Procedure: FINE NEEDLE ASPIRATION (FNA) LINEAR;  Surgeon: Milus Banister, MD;  Location: [facility];  Service: Endoscopy;  Laterality: N/A;   Laryngeal implant  2/13   after damage during thyroidectomy   THYROIDECTOMY  12/12   Lymphocytic thyroiditis with goiter--Dr Neche History   Social History Narrative   The patient is married and has 2 children.   She is employed in Librarian, academic at [employer]   Rare alcohol and never tobacco   family history includes Heart attack in her mother; Other in her father.   Review of Systems Per HPI  Objective:   Physical Exam BP 130/74   Pulse 67   Ht 5' 6"  (1.676 m)   Wt 206 lb (93.4 kg)   BMI 33.25 kg/m  NAD Lungs cta Cor NL S1S2 no rmg

## 2022-04-26 NOTE — Patient Instructions (Signed)
We will be in touch after consulting with Dr Rush Landmark.    _______________________________________________________  If you are age 69 or older, your body mass index should be between 23-30. Your Body mass index is 33.25 kg/m. If this is out of the aforementioned range listed, please consider follow up with your Primary Care Provider.  If you are age 82 or younger, your body mass index should be between 19-25. Your Body mass index is 33.25 kg/m. If this is out of the aformentioned range listed, please consider follow up with your Primary Care Provider.   ________________________________________________________  The [facility] providers would like to encourage you to use [facility] to communicate with providers for non-urgent requests or questions.  Due to long hold times on the telephone, sending your provider a message by [facility] may be a faster and more efficient way to get a response.  Please allow 48 business hours for a response.  Please remember that this is for non-urgent requests.  _______________________________________________________   I appreciate the opportunity to care for you. Silvano Rusk, MD, [facility]

## 2022-04-29 ENCOUNTER — Telehealth: Payer: Self-pay

## 2022-04-29 ENCOUNTER — Other Ambulatory Visit: Payer: Self-pay

## 2022-04-29 DIAGNOSIS — C49A2 Gastrointestinal stromal tumor of stomach: Secondary | ICD-10-CM

## 2022-04-29 NOTE — Telephone Encounter (Signed)
-----   Message from Irving Copas., MD sent at 04/27/2022  6:13 AM EDT ----- Regarding: RE: EUS request CG, Agree, with sub-2 cm gastric GIST most often will just monitor this with imaging and/or EUS.  Once greater than 2 cm should be referred to surgery.  Jeanise Durfey, Please move forward with scheduling EUS next available with me.  Thanks. GM ----- Message ----- From: Gatha Mayer, MD Sent: 04/26/2022   5:55 PM EDT To: Timothy Lasso, RN; Irving Copas., MD Subject: EUS request                                    Chester Holstein,  This lady has a very small gastric GIST.  Dan's plan was to repeat an EUS 1 year from last.  EUS was performed July 2022.  Things are stable we know from imaging but would appreciate setting her up for a follow-up EUS.  Glendell Docker

## 2022-04-29 NOTE — Telephone Encounter (Signed)
EUS has been scheduled for 06/20/22 at 730 am at [facility] with GM   Left message on machine to call back

## 2022-05-06 NOTE — Telephone Encounter (Signed)
Left message on machine to call back  

## 2022-05-07 NOTE — Telephone Encounter (Signed)
EUS scheduled, pt instructed and medications reviewed.  Patient instructions mailed to home and sent to My Chart .  Patient to call with any questions or concerns.  

## 2022-06-06 ENCOUNTER — Ambulatory Visit (INDEPENDENT_AMBULATORY_CARE_PROVIDER_SITE_OTHER): Payer: 59

## 2022-06-06 DIAGNOSIS — Z23 Encounter for immunization: Secondary | ICD-10-CM | POA: Diagnosis not present

## 2022-06-12 ENCOUNTER — Encounter (HOSPITAL_COMMUNITY): Payer: Self-pay | Admitting: Gastroenterology

## 2022-06-18 ENCOUNTER — Other Ambulatory Visit: Payer: Self-pay | Admitting: Internal Medicine

## 2022-06-19 ENCOUNTER — Encounter (HOSPITAL_COMMUNITY): Payer: Self-pay | Admitting: Gastroenterology

## 2022-06-19 NOTE — Anesthesia Preprocedure Evaluation (Signed)
Anesthesia Evaluation  Patient identified by MRN, date of birth, ID band Patient awake    Reviewed: Allergy & Precautions, NPO status , Patient's Chart, lab work & pertinent test results  Airway Mallampati: II  TM Distance: >3 FB Neck ROM: Full    Dental  (+) Teeth Intact, Dental Advisory Given   Pulmonary asthma    breath sounds clear to auscultation       Cardiovascular hypertension, Pt. on medications  Rhythm:Regular Rate:Normal  TTE  10/2020: EF 60-65%, grade I DD, mild MR/AR    Neuro/Psych negative neurological ROS  negative psych ROS   GI/Hepatic Neg liver ROS,GERD  Medicated,,GIST Celiac disease Chronic constipation   Endo/Other  Hypothyroidism  Hyperlipidemia Obesity  Renal/GU negative Renal ROS  negative genitourinary   Musculoskeletal negative musculoskeletal ROS (+)    Abdominal Normal abdominal exam  (+)   Peds  Hematology  (+) Blood dyscrasia, anemia   Anesthesia Other Findings   Reproductive/Obstetrics                             Anesthesia Physical Anesthesia Plan  ASA: 2  Anesthesia Plan: MAC   Post-op Pain Management: Minimal or no pain anticipated   Induction: Intravenous  PONV Risk Score and Plan: Propofol infusion and Treatment may vary due to age or medical condition  Airway Management Planned: Natural Airway and Nasal Cannula  Additional Equipment: None  Intra-op Plan:   Post-operative Plan:   Informed Consent: I have reviewed the patients History and Physical, chart, labs and discussed the procedure including the risks, benefits and alternatives for the proposed anesthesia with the patient or authorized representative who has indicated his/her understanding and acceptance.     Dental advisory given  Plan Discussed with:   Anesthesia Plan Comments:         Anesthesia Quick Evaluation

## 2022-06-20 ENCOUNTER — Ambulatory Visit (HOSPITAL_COMMUNITY): Payer: 59 | Admitting: Anesthesiology

## 2022-06-20 ENCOUNTER — Other Ambulatory Visit: Payer: Self-pay

## 2022-06-20 ENCOUNTER — Ambulatory Visit (HOSPITAL_BASED_OUTPATIENT_CLINIC_OR_DEPARTMENT_OTHER): Payer: 59 | Admitting: Anesthesiology

## 2022-06-20 ENCOUNTER — Encounter (HOSPITAL_COMMUNITY): Payer: Self-pay | Admitting: Gastroenterology

## 2022-06-20 ENCOUNTER — Ambulatory Visit (HOSPITAL_COMMUNITY)
Admission: RE | Admit: 2022-06-20 | Discharge: 2022-06-20 | Disposition: A | Discharge status: Home / Self Care | Payer: 59 | Attending: Gastroenterology | Admitting: Gastroenterology

## 2022-06-20 ENCOUNTER — Encounter (HOSPITAL_COMMUNITY)
Admission: RE | Disposition: A | Discharge status: Home / Self Care | Payer: Self-pay | Source: Home / Self Care | Attending: Gastroenterology

## 2022-06-20 DIAGNOSIS — I899 Noninfective disorder of lymphatic vessels and lymph nodes, unspecified: Secondary | ICD-10-CM

## 2022-06-20 DIAGNOSIS — K3189 Other diseases of stomach and duodenum: Secondary | ICD-10-CM | POA: Diagnosis not present

## 2022-06-20 DIAGNOSIS — C49A3 Gastrointestinal stromal tumor of small intestine: Secondary | ICD-10-CM | POA: Insufficient documentation

## 2022-06-20 DIAGNOSIS — K219 Gastro-esophageal reflux disease without esophagitis: Secondary | ICD-10-CM | POA: Insufficient documentation

## 2022-06-20 DIAGNOSIS — K929 Disease of digestive system, unspecified: Secondary | ICD-10-CM | POA: Insufficient documentation

## 2022-06-20 DIAGNOSIS — J45909 Unspecified asthma, uncomplicated: Secondary | ICD-10-CM | POA: Insufficient documentation

## 2022-06-20 DIAGNOSIS — I10 Essential (primary) hypertension: Secondary | ICD-10-CM | POA: Insufficient documentation

## 2022-06-20 DIAGNOSIS — Z6833 Body mass index (BMI) 33.0-33.9, adult: Secondary | ICD-10-CM | POA: Insufficient documentation

## 2022-06-20 DIAGNOSIS — E669 Obesity, unspecified: Secondary | ICD-10-CM | POA: Diagnosis not present

## 2022-06-20 DIAGNOSIS — E785 Hyperlipidemia, unspecified: Secondary | ICD-10-CM | POA: Insufficient documentation

## 2022-06-20 DIAGNOSIS — K449 Diaphragmatic hernia without obstruction or gangrene: Secondary | ICD-10-CM | POA: Diagnosis not present

## 2022-06-20 DIAGNOSIS — C49A2 Gastrointestinal stromal tumor of stomach: Secondary | ICD-10-CM

## 2022-06-20 DIAGNOSIS — K2289 Other specified disease of esophagus: Secondary | ICD-10-CM | POA: Insufficient documentation

## 2022-06-20 DIAGNOSIS — E039 Hypothyroidism, unspecified: Secondary | ICD-10-CM | POA: Diagnosis not present

## 2022-06-20 DIAGNOSIS — Z79899 Other long term (current) drug therapy: Secondary | ICD-10-CM | POA: Insufficient documentation

## 2022-06-20 HISTORY — PX: EUS: SHX5427

## 2022-06-20 HISTORY — PX: ESOPHAGOGASTRODUODENOSCOPY: SHX5428

## 2022-06-20 SURGERY — UPPER ENDOSCOPIC ULTRASOUND (EUS) RADIAL
Anesthesia: Monitor Anesthesia Care

## 2022-06-20 MED ORDER — LIDOCAINE 2% (20 MG/ML) 5 ML SYRINGE
INTRAMUSCULAR | Status: DC | PRN
  Administered 2022-06-20: 100 mg via INTRAVENOUS

## 2022-06-20 MED ORDER — PROPOFOL 500 MG/50ML IV EMUL
INTRAVENOUS | Status: DC | PRN
  Administered 2022-06-20: 125 ug/kg/min via INTRAVENOUS

## 2022-06-20 MED ORDER — PROPOFOL 10 MG/ML IV BOLUS
INTRAVENOUS | Status: DC | PRN
  Administered 2022-06-20 (×4): 20 mg via INTRAVENOUS

## 2022-06-20 MED ORDER — PROPOFOL 1000 MG/100ML IV EMUL
INTRAVENOUS | Status: AC
  Filled 2022-06-20: qty 100

## 2022-06-20 MED ORDER — LACTATED RINGERS IV SOLN: INTRAVENOUS | Status: DC

## 2022-06-20 MED ORDER — PROPOFOL 10 MG/ML IV BOLUS
INTRAVENOUS | Status: AC
  Filled 2022-06-20: qty 20

## 2022-06-20 MED ORDER — SODIUM CHLORIDE 0.9 % IV SOLN: INTRAVENOUS | Status: DC

## 2022-06-20 NOTE — H&P (Signed)
GASTROENTEROLOGY PROCEDURE H&P NOTE   Primary Care Physician: [name], MD  HPI: Sharon Shepherd is a 71 y.o. female who presents for EGD/EUS to re-evaluate the size of previously noted sub 2cm GIST.  Past Medical History:  Diagnosis Date   Allergy    Anemia    Celiac disease    COVID 09/25/2020   Diverticulitis    GERD (gastroesophageal reflux disease)    GIST (gastrointestinal stroma tumor), malignant, colon ([place])    Hyperlipidemia    Hypertension    Sleep disturbance    Thyroiditis, lymphocytic    surgery 2013   Vitamin B12 deficiency    unresponsive to oral replacement   Past Surgical History:  Procedure Laterality Date   COLONOSCOPY     ESOPHAGOGASTRODUODENOSCOPY  03/04   colon   ESOPHAGOGASTRODUODENOSCOPY N/A 02/15/2021   Procedure: ESOPHAGOGASTRODUODENOSCOPY (EGD);  Surgeon: Milus Banister, MD;  Location: [facility];  Service: Endoscopy;  Laterality: N/A;   EUS N/A 02/15/2021   Procedure: UPPER ENDOSCOPIC ULTRASOUND (EUS) RADIAL;  Surgeon: Milus Banister, MD;  Location: [facility];  Service: Endoscopy;  Laterality: N/A;   FINE NEEDLE ASPIRATION N/A 02/15/2021   Procedure: FINE NEEDLE ASPIRATION (FNA) LINEAR;  Surgeon: Milus Banister, MD;  Location: [facility];  Service: Endoscopy;  Laterality: N/A;   Laryngeal implant  2/13   after damage during thyroidectomy   THYROIDECTOMY  12/12   Lymphocytic thyroiditis with goiter--Dr Rochel Brome   Current Facility-Administered Medications  Medication Dose Route Frequency Provider Last Rate Last Admin   0.9 %  sodium chloride infusion   Intravenous Continuous Mansouraty, Telford Nab., MD       lactated ringers infusion   Intravenous Continuous Mansouraty, Telford Nab., MD 10 mL/hr at 06/20/22 0717 New Bag at 06/20/22 0717    Current Facility-Administered Medications:    0.9 %  sodium chloride infusion, , Intravenous, Continuous, Mansouraty, Telford Nab., MD   lactated ringers infusion, , Intravenous,  Continuous, Mansouraty, Telford Nab., MD, Last Rate: 10 mL/hr at 06/20/22 0717, New Bag at 06/20/22 0717 Allergies  Allergen Reactions   Codeine Itching   Family History  Problem Relation Age of Onset   Other Father        pancreatic absess   Heart attack Mother    Colon cancer Neg Hx    Breast cancer Neg Hx    Esophageal cancer Neg Hx    Stomach cancer Neg Hx    Rectal cancer Neg Hx    Social History   Socioeconomic History   Marital status: Married    Spouse name: Not on file   Number of children: 2   Years of education: Not on file   Highest education level: Not on file  Occupational History   Occupation: Building control surveyor: [employer]  Tobacco Use   Smoking status: Never    Passive exposure: Past (as a child)   Smokeless tobacco: Never  Vaping Use   Vaping Use: Never used  Substance and Sexual Activity   Alcohol use: Not Currently    Comment: rare   Drug use: No   Sexual activity: Not on file  Other Topics Concern   Not on file  Social History Narrative   The patient is married and has 2 children.   She is employed in Librarian, academic at [employer]   Rare alcohol and never tobacco   Social Determinants of Radio broadcast assistant Strain: Not on file  Food Insecurity: Not  on file  Transportation Needs: Not on file  Physical Activity: Not on file  Stress: Not on file  Social Connections: Not on file  Intimate Partner Violence: Not on file    Physical Exam: Today's Vitals   06/12/22 0946  BP: (!) 147/78  Pulse: 71  Resp: 19  Temp: 97.9 F (36.6 C)  TempSrc: Oral  SpO2: 96%  Weight: 93.4 kg  Height: 5' 6"  (1.676 m)  PainSc: 0-No pain   Body mass index is 33.23 kg/m. GEN: NAD EYE: Sclerae anicteric ENT: MMM CV: Non-tachycardic GI: Soft, NT/ND NEURO:  Alert & Oriented x 3  Lab Results: No results for input(s): "WBC", "HGB", "HCT", "PLT" in the last 72 hours. BMET No results for input(s): "NA", "K", "CL", "CO2", "GLUCOSE", "BUN",  "CREATININE", "CALCIUM" in the last 72 hours. LFT No results for input(s): "PROT", "ALBUMIN", "AST", "ALT", "ALKPHOS", "BILITOT", "BILIDIR", "IBILI" in the last 72 hours. PT/INR No results for input(s): "LABPROT", "INR" in the last 72 hours.   Impression / Plan: This is a 71 y.o.female who presents for EGD/EUS to re-evaluate the size of previously noted sub 2cm GIST.  The risks of an EUS including intestinal perforation, bleeding, infection, aspiration, and medication effects were discussed as was the possibility it may not give a definitive diagnosis if a biopsy is performed.  When a biopsy of the pancreas is done as part of the EUS, there is an additional risk of pancreatitis at the rate of about 1-2%.  It was explained that procedure related pancreatitis is typically mild, although it can be severe and even life threatening, which is why we do not perform random pancreatic biopsies and only biopsy a lesion/area we feel is concerning enough to warrant the risk.  The risks and benefits of endoscopic evaluation/treatment were discussed with the patient and/or family; these include but are not limited to the risk of perforation, infection, bleeding, missed lesions, lack of diagnosis, severe illness requiring hospitalization, as well as anesthesia and sedation related illnesses.  The patient's history has been reviewed, patient examined, no change in status, and deemed stable for procedure.  The patient and/or family is agreeable to proceed.    Justice Britain, MD [facility] Office # [phone]

## 2022-06-20 NOTE — Anesthesia Postprocedure Evaluation (Signed)
Anesthesia Post Note  Patient: Sharon Shepherd  Procedure(s) Performed: UPPER ENDOSCOPIC ULTRASOUND (EUS) RADIAL ESOPHAGOGASTRODUODENOSCOPY (EGD)     Patient location during evaluation: PACU Anesthesia Type: MAC Level of consciousness: awake and alert Pain management: pain level controlled Vital Signs Assessment: post-procedure vital signs reviewed and stable Respiratory status: spontaneous breathing, nonlabored ventilation, respiratory function stable and patient connected to nasal cannula oxygen Cardiovascular status: stable and blood pressure returned to baseline Postop Assessment: no apparent nausea or vomiting Anesthetic complications: no   No notable events documented.  Last Vitals:  Vitals:   06/20/22 0830 06/20/22 0840  BP: (!) 120/42 123/85  Pulse: 61 62  Resp: 14 14  Temp:    SpO2: 100% 97%    Last Pain:  Vitals:   06/20/22 0822  TempSrc: Oral  PainSc: 0-No pain                 Effie Berkshire

## 2022-06-20 NOTE — Op Note (Signed)
[facility] Patient Name: Sharon Shepherd Procedure Date: 06/20/2022 MRN: [id number] Attending MD: Justice Britain , MD, [id number] Date of Birth: 1951/05/25 CSN: [id number] Age: 71 Admit Type: Outpatient Procedure:                Upper EUS Indications:              Gastrointestinal stromal tumor (GIST) of the small                            intestine, Submucosal tumor versus extrinsic mass                            found on endoscopy Providers:                Justice Britain, MD, Benay Pillow, RN, Cletis Athens, Technician Referring MD:             Gatha Mayer, MD, [name] Medicines:                Monitored Anesthesia Care Complications:            No immediate complications. Estimated Blood Loss:     Estimated blood loss was minimal. Procedure:                Pre-Anesthesia Assessment:                           - Prior to the procedure, a History and Physical                            was performed, and patient medications and                            allergies were reviewed. The patient's tolerance of                            previous anesthesia was also reviewed. The risks                            and benefits of the procedure and the sedation                            options and risks were discussed with the patient.                            All questions were answered, and informed consent                            was obtained. Prior Anticoagulants: The patient has                            taken no anticoagulant or antiplatelet agents  except for aspirin. ASA Grade Assessment: II - A                            patient with mild systemic disease. After reviewing                            the risks and benefits, the patient was deemed in                            satisfactory condition to undergo the procedure.                           After obtaining informed consent, the  endoscope was                            passed under direct vision. Throughout the                            procedure, the patient's blood pressure, pulse, and                            oxygen saturations were monitored continuously. The                            [id number]-H190 ([id number] Olympus endoscope was introduced                            through the mouth, and advanced to the second part                            of duodenum. The [id number]-[id number] ([id number]) Olympus                            radial ultrasound scope was introduced through the                            mouth, and advanced to the stomach for ultrasound                            examination. The upper EUS was accomplished without                            difficulty. The patient tolerated the procedure. Scope In: Scope Out: Findings:      ENDOSCOPIC FINDING: :      No gross lesions were noted in the entire esophagus.      The Z-line was irregular and was found 34 cm from the incisors.      A 4 cm hiatal hernia was present.      A single 18 mm subepithelial papule (nodule) with no bleeding and no       stigmata of recent bleeding was found on the lesser curvature of the       gastric body.      No gross lesions were noted in the entire examined stomach.  No gross lesions were noted in the duodenal bulb, in the first portion       of the duodenum and in the second portion of the duodenum.      ENDOSONOGRAPHIC FINDING: :      A round intramural (subepithelial) lesion was found in the lesser curve       of the stomach. The lesion was hypoechoic and calcified.       Sonographically, the lesion appeared to originate from the muscularis       propria (Layer 4). The lesion measured 14 mm (in maximum thickness). The       lesion also measured 13 mm in diameter. The outer endosonographic       borders were well defined.      Endosonographic imaging in the entire stomach showed no wall thickening.      Endosonographic  imaging in the visualized portion of the liver showed no       mass.      No malignant-appearing lymph nodes were visualized in the left gastric       region (level 17), gastrohepatic ligament (level 18) and perigastric       region.      The celiac region was visualized. Impression:               EGD impression:                           - No gross lesions in the entire esophagus. Z-line                            irregular, 34 cm from the incisors.                           - 4 cm hiatal hernia.                           - A single subepithelial papule (nodule) found in                            the lesser curve of the body of the stomach -                            previously biopsied and is consistent with GIST.                           - No other gross lesions in the entire stomach.                           - No gross lesions in the duodenal bulb, in the                            first portion of the duodenum and in the second                            portion of the duodenum.                           EUS  impression:                           - An intramural (subepithelial) lesion was found in                            the lesser curve of the body of the stomach. The                            lesion appeared to originate from within the                            muscularis propria (Layer 4). A tissue diagnosis                            was obtained prior to this exam. This is consistent                            with a stromal cell (smooth muscle) neoplasm.                           - No malignant-appearing lymph nodes were                            visualized in the left gastric region (level 17),                            gastrohepatic ligament (level 18) and perigastric                            region. Moderate Sedation:      Not Applicable - Patient had care per Anesthesia. Recommendation:           - The patient will be observed post-procedure,                             until all discharge criteria are met.                           - Discharge patient to home.                           - Patient has a contact number available for                            emergencies. The signs and symptoms of potential                            delayed complications were discussed with the                            patient. Return to normal activities tomorrow.                            Written discharge instructions  were provided to the                            patient.                           - Resume previous diet.                           - Observe patient's clinical course.                           - Repeat the upper endoscopic ultrasound in 1 year                            for surveillance. If overall stable GIST then will                            plan to extend out surveillance to every 2-years.                           - The findings and recommendations were discussed                            with the patient's family.                           - The findings and recommendations were discussed                            with the patient. Procedure Code(s):        --- Professional ---                           [phone], Esophagogastroduodenoscopy, flexible,                            transoral; with endoscopic ultrasound examination                            limited to the esophagus, stomach or duodenum, and                            adjacent structures Diagnosis Code(s):        --- Professional ---                           K22.89, Other specified disease of esophagus                           K44.9, Diaphragmatic hernia without obstruction or                            gangrene                           K31.89, Other diseases of stomach and duodenum  I89.9, Noninfective disorder of lymphatic vessels                            and lymph nodes, unspecified                           C49.A3, Gastrointestinal  stromal tumor of small                            intestine                           K92.9, Disease of digestive system, unspecified CPT copyright 2022 American Medical Association. All rights reserved. The codes documented in this report are preliminary and upon coder review may  be revised to meet current compliance requirements. Justice Britain, MD 06/20/2022 8:33:51 AM Number of Addenda: 0

## 2022-06-20 NOTE — Transfer of Care (Signed)
Immediate Anesthesia Transfer of Care Note  Patient: Sharon Shepherd  Procedure(s) Performed: UPPER ENDOSCOPIC ULTRASOUND (EUS) RADIAL ESOPHAGOGASTRODUODENOSCOPY (EGD)  Patient Location: Endoscopy Unit  Anesthesia Type:MAC  Level of Consciousness: drowsy  Airway & Oxygen Therapy: Patient Spontanous Breathing and Patient connected to face mask oxygen  Post-op Assessment: Report given to RN and Post -op Vital signs reviewed and stable  Post vital signs: Reviewed and stable  Last Vitals:  Vitals Value Taken Time  BP 107/50 06/20/22 0822  Temp    Pulse 59 06/20/22 0823  Resp 12 06/20/22 0823  SpO2 100 % 06/20/22 0823  Vitals shown include unvalidated device data.  Last Pain:  Vitals:   06/12/22 0946  TempSrc: Oral  PainSc: 0-No pain      Patients Stated Pain Goal: 3 (48/88/91 6945)  Complications: No notable events documented.

## 2022-06-20 NOTE — Discharge Instructions (Signed)
YOU HAD AN ENDOSCOPIC PROCEDURE TODAY: Refer to the procedure report and other information in the discharge instructions given to you for any specific questions about what was found during the examination. If this information does not answer your questions, please call [place] office at [phone] to clarify.   YOU SHOULD EXPECT: Some feelings of bloating in the abdomen. Passage of more gas than usual. Walking can help get rid of the air that was put into your GI tract during the procedure and reduce the bloating. If you had a lower endoscopy (such as a colonoscopy or flexible sigmoidoscopy) you may notice spotting of blood in your stool or on the toilet paper. Some abdominal soreness may be present for a day or two, also.  DIET: Your first meal following the procedure should be a light meal and then it is ok to progress to your normal diet. A half-sandwich or bowl of soup is an example of a good first meal. Heavy or fried foods are harder to digest and may make you feel nauseous or bloated. Drink plenty of fluids but you should avoid alcoholic beverages for 24 hours. If you had a esophageal dilation, please see attached instructions for diet.    ACTIVITY: Your care partner should take you home directly after the procedure. You should plan to take it easy, moving slowly for the rest of the day. You can resume normal activity the day after the procedure however YOU SHOULD NOT DRIVE, use power tools, machinery or perform tasks that involve climbing or major physical exertion for 24 hours (because of the sedation medicines used during the test).   SYMPTOMS TO REPORT IMMEDIATELY: A gastroenterologist can be reached at any hour. Please call [phone]  for any of the following symptoms:   Following upper endoscopy (EGD, EUS, ERCP, esophageal dilation) Vomiting of blood or coffee ground material  New, significant abdominal pain  New, significant chest pain or pain under the shoulder blades  Painful or  persistently difficult swallowing  New shortness of breath  Black, tarry-looking or red, bloody stools  FOLLOW UP:  If any biopsies were taken you will be contacted by phone or by letter within the next 1-3 weeks. Call [phone]  if you have not heard about the biopsies in 3 weeks.  Please also call with any specific questions about appointments or follow up tests.  

## 2022-06-21 ENCOUNTER — Telehealth: Payer: Self-pay

## 2022-06-21 NOTE — Telephone Encounter (Signed)
Recall entered as ordered

## 2022-06-21 NOTE — Telephone Encounter (Signed)
-----   Message from Irving Copas., MD sent at 06/21/2022  3:08 AM EST ----- Regarding: EUS Recall Sharon Shepherd, 1 year EUS recall.  Thanks. GM

## 2022-06-23 ENCOUNTER — Encounter (HOSPITAL_COMMUNITY): Payer: Self-pay | Admitting: Gastroenterology

## 2022-08-14 ENCOUNTER — Telehealth: Payer: 59 | Admitting: Nurse Practitioner

## 2022-08-14 DIAGNOSIS — J4520 Mild intermittent asthma, uncomplicated: Secondary | ICD-10-CM

## 2022-08-14 DIAGNOSIS — Z20828 Contact with and (suspected) exposure to other viral communicable diseases: Secondary | ICD-10-CM | POA: Diagnosis not present

## 2022-08-14 MED ORDER — OSELTAMIVIR PHOSPHATE 75 MG PO CAPS: 75.0000 mg | ORAL_CAPSULE | Freq: Every day | ORAL | 0 refills | Status: AC

## 2022-08-14 MED ORDER — ALBUTEROL SULFATE HFA 108 (90 BASE) MCG/ACT IN AERS: 2.0000 | INHALATION_SPRAY | Freq: Four times a day (QID) | RESPIRATORY_TRACT | 0 refills | Status: AC | PRN

## 2022-08-14 NOTE — Progress Notes (Signed)
Virtual Visit Consent   Sharon Shepherd, you are scheduled for a virtual visit with a [facility] provider today. Just as with appointments in the office, your consent must be obtained to participate. Your consent will be active for this visit and any virtual visit you may have with one of our providers in the next 365 days. If you have a MyChart account, a copy of this consent can be sent to you electronically.  As this is a virtual visit, video technology does not allow for your provider to perform a traditional examination. This may limit your provider's ability to fully assess your condition. If your provider identifies any concerns that need to be evaluated in person or the need to arrange testing (such as labs, EKG, etc.), we will make arrangements to do so. Although advances in technology are sophisticated, we cannot ensure that it will always work on either your end or our end. If the connection with a video visit is poor, the visit may have to be switched to a telephone visit. With either a video or telephone visit, we are not always able to ensure that we have a secure connection.  By engaging in this virtual visit, you consent to the provision of healthcare and authorize for your insurance to be billed (if applicable) for the services provided during this visit. Depending on your insurance coverage, you may receive a charge related to this service.  I need to obtain your verbal consent now. Are you willing to proceed with your visit today? [name] has provided verbal consent on 08/14/2022 for a virtual visit (video or telephone). Apolonio Schneiders, FNP  Date: 08/14/2022 10:28 AM  Virtual Visit via Video Note   I, Apolonio Schneiders, connected with  Sharon Shepherd  ([id number], 14-May-1951) on 08/14/22 at 10:30 AM EST by a video-enabled telemedicine application and verified that I am speaking with the correct person using two identifiers.  Location: Patient: Virtual Visit  Location Patient: Home Provider: Virtual Visit Location Provider: Home Office   I discussed the limitations of evaluation and management by telemedicine and the availability of in person appointments. The patient expressed understanding and agreed to proceed.    History of Present Illness: Sharon Shepherd is a 72 y.o. who identifies as a female who was assigned female at birth, and is being seen today for flu exposure.  Her husband tested positive for Flu, she is currently not having any symptoms.   She believes she has taken Tamiflu in the past   She did have a flu vaccine in December of 2023   She has an inhaler for allergic asthma   Problems:  Patient Active Problem List   Diagnosis Date Noted   Left upper quadrant abdominal pain 09/21/2021   Gastrointestinal stromal tumor (GIST) of stomach ([place]) 03/19/2021   Iron deficiency anemia 10/02/2020   Chronic constipation 06/21/2019   Allergic asthma 01/14/2018   Hyperlipidemia    Vitamin B12 deficiency    Hypothyroidism    Routine general medical examination at a health care facility 01/15/2011   Sleep disturbance 06/05/2009   ANEMIA-NOS 10/22/2007   Essential hypertension, benign 10/22/2007   DIVERTICULOSIS, COLON 10/22/2007   Celiac disease 10/22/2007    Allergies:  Allergies  Allergen Reactions   Codeine Itching   Medications:  Current Outpatient Medications:    albuterol (PROVENTIL HFA;VENTOLIN HFA) 108 (90 Base) MCG/ACT inhaler, Inhale 2 puffs into the lungs every 6 (six) hours as needed for wheezing or shortness of  breath., Disp: 1 Inhaler, Rfl: 1   aspirin 81 MG chewable tablet, Chew 81 mg by mouth every other day. In the evening, Disp: , Rfl:    atorvastatin (LIPITOR) 20 MG tablet, TAKE 1 TABLET BY MOUTH  DAILY, Disp: 90 tablet, Rfl: 3   calcium carbonate (TUMS - DOSED IN MG ELEMENTAL CALCIUM) 500 MG chewable tablet, Chew 2 tablets by mouth daily as needed for indigestion or heartburn., Disp: , Rfl:     cetirizine (ZYRTEC) 10 MG tablet, TAKE ONE TABLET BY MOUTH EVERY DAY, Disp: 100 tablet, Rfl: 4   Cholecalciferol (VITAMIN D3) 5000 UNITS CAPS, Take 5,000 Units by mouth at bedtime., Disp: , Rfl:    diazepam (VALIUM) 5 MG tablet, Take 1 tablet (5 mg total) by mouth 3 (three) times daily as needed. For severe vertigo (Patient not taking: Reported on 06/17/2022), Disp: 30 tablet, Rfl: 0   ibuprofen (ADVIL) 200 MG tablet, Take 400 mg by mouth every 8 (eight) hours as needed (pain.)., Disp: , Rfl:    levothyroxine (SYNTHROID) 50 MCG tablet, TAKE 1 TABLET BY MOUTH DAILY, Disp: 90 tablet, Rfl: 0   LINZESS 290 MCG CAPS capsule, TAKE ONE CAPSULE BY MOUTH EVERY OTHER DAY IN THE MORNING., Disp: 90 capsule, Rfl: 1   meclizine (ANTIVERT) 25 MG tablet, Take 1-2 tablets (25-50 mg total) by mouth 3 (three) times daily as needed. For vertigo (Patient taking differently: Take 25-50 mg by mouth 3 (three) times daily as needed (dizziness/vertigo).), Disp: 60 tablet, Rfl: 0   Syringe/Needle, Disp, 25G X 1-1/2" 1 ML MISC, 1 each by Does not apply route every 30 (thirty) days., Disp: 1 each, Rfl: 11   valsartan-hydrochlorothiazide (DIOVAN-HCT) 160-12.5 MG tablet, TAKE 1 TABLET BY MOUTH  DAILY, Disp: 90 tablet, Rfl: 3  Observations/Objective: Patient is well-developed, well-nourished in no acute distress.  Resting comfortably  at home.  Head is normocephalic, atraumatic.  No labored breathing.  Speech is clear and coherent with logical content.  Patient is alert and oriented at baseline.    Assessment and Plan: 1. Exposure to the flu  - oseltamivir (TAMIFLU) 75 MG capsule; Take 1 capsule (75 mg total) by mouth daily for 10 days.  Dispense: 10 capsule; Refill: 0  2. Mild intermittent asthma, unspecified whether complicated  - albuterol (VENTOLIN HFA) 108 (90 Base) MCG/ACT inhaler; Inhale 2 puffs into the lungs every 6 (six) hours as needed for wheezing or shortness of breath.  Dispense: 8 g; Refill: 0        Follow Up Instructions: I discussed the assessment and treatment plan with the patient. The patient was provided an opportunity to ask questions and all were answered. The patient agreed with the plan and demonstrated an understanding of the instructions.  A copy of instructions were sent to the patient via MyChart unless otherwise noted below.    The patient was advised to call back or seek an in-person evaluation if the symptoms worsen or if the condition fails to improve as anticipated.  Time:  I spent 10 minutes with the patient via telehealth technology discussing the above problems/concerns.    Apolonio Schneiders, FNP

## 2022-08-21 ENCOUNTER — Other Ambulatory Visit (INDEPENDENT_AMBULATORY_CARE_PROVIDER_SITE_OTHER): Payer: 59

## 2022-08-21 DIAGNOSIS — E039 Hypothyroidism, unspecified: Secondary | ICD-10-CM

## 2022-08-22 ENCOUNTER — Encounter: Payer: Self-pay | Admitting: Internal Medicine

## 2022-08-22 LAB — T4, FREE: Free T4: 0.98 ng/dL (ref 0.82–1.77)

## 2022-08-22 LAB — TSH: TSH: 11.5 u[IU]/mL — ABNORMAL HIGH (ref 0.450–4.500)

## 2022-08-25 ENCOUNTER — Other Ambulatory Visit: Payer: Self-pay | Admitting: Internal Medicine

## 2022-08-28 ENCOUNTER — Other Ambulatory Visit: Payer: 59

## 2022-09-09 ENCOUNTER — Telehealth: Payer: Self-pay

## 2022-09-09 NOTE — Telephone Encounter (Signed)
Transition Care Management Unsuccessful Follow-up Telephone Call  Date of discharge and from where:  [facility] 09-07-22 Dx: chest pain   Attempts:  1st Attempt  Reason for unsuccessful TCM follow-up call:  Unable to reach patient   Juanda Crumble LPN [facility] Direct Dial [phone]

## 2022-09-10 ENCOUNTER — Ambulatory Visit: Payer: 59 | Admitting: Internal Medicine

## 2022-09-10 ENCOUNTER — Encounter: Payer: Self-pay | Admitting: Internal Medicine

## 2022-09-10 VITALS — BP 112/84 | HR 74 | Temp 97.6°F | Ht 66.0 in | Wt 206.0 lb

## 2022-09-10 DIAGNOSIS — M79602 Pain in left arm: Secondary | ICD-10-CM | POA: Diagnosis not present

## 2022-09-10 DIAGNOSIS — E039 Hypothyroidism, unspecified: Secondary | ICD-10-CM | POA: Diagnosis not present

## 2022-09-10 DIAGNOSIS — I10 Essential (primary) hypertension: Secondary | ICD-10-CM | POA: Diagnosis not present

## 2022-09-10 MED ORDER — VALSARTAN-HYDROCHLOROTHIAZIDE 320-12.5 MG PO TABS: 1.0000 | ORAL_TABLET | Freq: Every day | ORAL | 3 refills | Status: DC

## 2022-09-10 NOTE — Assessment & Plan Note (Signed)
Was having stress--and BP elevated Unclear if this could have been anginal Will ask Dr Garen Lah about cardiac testing (had echo and zio 2 years ago)

## 2022-09-10 NOTE — Assessment & Plan Note (Signed)
TSH had been high Now alternating 75 with 17mg every other day

## 2022-09-10 NOTE — Telephone Encounter (Signed)
Pt had appt with PCP 09-10-22 at 815am

## 2022-09-10 NOTE — Assessment & Plan Note (Signed)
Was very high with stress and arm symptoms  Has stayed high since then--but better with adding lisinopril to the valsartan/HCTZ 160/12.5  Will increase the valsartan to 320/12.5 Stop the lisinopril

## 2022-09-10 NOTE — Progress Notes (Signed)
Subjective:    Patient ID: Sharon Shepherd, female    DOB: May 11, 1951, 72 y.o.   MRN: [id number]  HPI Here with husband for ER follow up  At friend's house and got heavy feeling in left arm Had weird dizzy feeling Happened twice---lasting a couple of minutes Hadn't eaten in many hours---checked sugar and it was okay BP was 187/105--got worried Kept checking and stayed up Took husband's lisinopril '20mg'$  ---1/2 ('10mg'$ ) before going Husband drove her to ER----[facility] [place] Troponin's negative and labs okay except TSH up  BP still up some---still taking 5 or '10mg'$  lisinopril as extra On the valsartan One bad vertigo spell--but no orthostatic dizziness Slight chest twinges--just sitting around No exercise or exertion--other than housework  Current Outpatient Medications on File Prior to Visit  Medication Sig Dispense Refill   albuterol (PROVENTIL HFA;VENTOLIN HFA) 108 (90 Base) MCG/ACT inhaler Inhale 2 puffs into the lungs every 6 (six) hours as needed for wheezing or shortness of breath. 1 Inhaler 1   albuterol (VENTOLIN HFA) 108 (90 Base) MCG/ACT inhaler Inhale 2 puffs into the lungs every 6 (six) hours as needed for wheezing or shortness of breath. 8 g 0   aspirin 81 MG chewable tablet Chew 81 mg by mouth every other day. In the evening     atorvastatin (LIPITOR) 20 MG tablet TAKE 1 TABLET BY MOUTH  DAILY 90 tablet 3   calcium carbonate (TUMS - DOSED IN MG ELEMENTAL CALCIUM) 500 MG chewable tablet Chew 2 tablets by mouth daily as needed for indigestion or heartburn.     cetirizine (ZYRTEC) 10 MG tablet TAKE ONE TABLET BY MOUTH EVERY DAY 100 tablet 4   Cholecalciferol (VITAMIN D3) 5000 UNITS CAPS Take 5,000 Units by mouth at bedtime.     diazepam (VALIUM) 5 MG tablet Take 1 tablet (5 mg total) by mouth 3 (three) times daily as needed. For severe vertigo 30 tablet 0   ibuprofen (ADVIL) 200 MG tablet Take 400 mg by mouth every 8 (eight) hours as needed (pain.).      levothyroxine (SYNTHROID) 50 MCG tablet TAKE 1 TABLET BY MOUTH DAILY 90 tablet 0   LINZESS 290 MCG CAPS capsule TAKE ONE CAPSULE BY MOUTH EVERY OTHER DAY IN THE MORNING. 90 capsule 1   LORazepam (ATIVAN) 0.5 MG tablet Take 0.5 mg by mouth every 8 (eight) hours as needed.     meclizine (ANTIVERT) 25 MG tablet Take 1-2 tablets (25-50 mg total) by mouth 3 (three) times daily as needed. For vertigo (Patient taking differently: Take 25-50 mg by mouth 3 (three) times daily as needed (dizziness/vertigo).) 60 tablet 0   Syringe/Needle, Disp, 25G X 1-1/2" 1 ML MISC 1 each by Does not apply route every 30 (thirty) days. 1 each 11   valsartan-hydrochlorothiazide (DIOVAN-HCT) 160-12.5 MG tablet TAKE 1 TABLET BY MOUTH DAILY 90 tablet 3   No current facility-administered medications on file prior to visit.    Allergies  Allergen Reactions   Codeine Itching    Past Medical History:  Diagnosis Date   Allergy    Anemia    Celiac disease    COVID 09/25/2020   Diverticulitis    GERD (gastroesophageal reflux disease)    GIST (gastrointestinal stroma tumor), malignant, colon ([place])    Hyperlipidemia    Hypertension    Sleep disturbance    Thyroiditis, lymphocytic    surgery 2013   Vitamin B12 deficiency    unresponsive to oral replacement    Past Surgical  History:  Procedure Laterality Date   COLONOSCOPY     ESOPHAGOGASTRODUODENOSCOPY  03/04   colon   ESOPHAGOGASTRODUODENOSCOPY N/A 02/15/2021   Procedure: ESOPHAGOGASTRODUODENOSCOPY (EGD);  Surgeon: Milus Banister, MD;  Location: [facility];  Service: Endoscopy;  Laterality: N/A;   ESOPHAGOGASTRODUODENOSCOPY N/A 06/20/2022   Procedure: ESOPHAGOGASTRODUODENOSCOPY (EGD);  Surgeon: Irving Copas., MD;  Location: [facility];  Service: Gastroenterology;  Laterality: N/A;   EUS N/A 02/15/2021   Procedure: UPPER ENDOSCOPIC ULTRASOUND (EUS) RADIAL;  Surgeon: Milus Banister, MD;  Location: [facility];  Service: Endoscopy;  Laterality: N/A;    EUS N/A 06/20/2022   Procedure: UPPER ENDOSCOPIC ULTRASOUND (EUS) RADIAL;  Surgeon: Irving Copas., MD;  Location: [facility];  Service: Gastroenterology;  Laterality: N/A;   FINE NEEDLE ASPIRATION N/A 02/15/2021   Procedure: FINE NEEDLE ASPIRATION (FNA) LINEAR;  Surgeon: Milus Banister, MD;  Location: [facility];  Service: Endoscopy;  Laterality: N/A;   Laryngeal implant  2/13   after damage during thyroidectomy   THYROIDECTOMY  12/12   Lymphocytic thyroiditis with goiter--Dr Rochel Brome    Family History  Problem Relation Age of Onset   Other Father        pancreatic absess   Heart attack Mother    Colon cancer Neg Hx    Breast cancer Neg Hx    Esophageal cancer Neg Hx    Stomach cancer Neg Hx    Rectal cancer Neg Hx     Social History   Socioeconomic History   Marital status: Married    Spouse name: Not on file   Number of children: 2   Years of education: Not on file   Highest education level: Not on file  Occupational History   Occupation: Building control surveyor: [employer]  Tobacco Use   Smoking status: Never    Passive exposure: Past (as a child)   Smokeless tobacco: Never  Vaping Use   Vaping Use: Never used  Substance and Sexual Activity   Alcohol use: Not Currently    Comment: rare   Drug use: No   Sexual activity: Not on file  Other Topics Concern   Not on file  Social History Narrative   The patient is married and has 2 children.   She is employed in Librarian, academic at [employer]   Rare alcohol and never tobacco   Social Determinants of Radio broadcast assistant Strain: Not on file  Food Insecurity: Not on file  Transportation Needs: Not on file  Physical Activity: Not on file  Stress: Not on file  Social Connections: Not on file  Intimate Partner Violence: Not on file   Review of Systems Has increased her levothyroxine--- 50/75 every other day Never sleeps well--but no change Appetite is okay    Objective:   Physical Exam          Assessment & Plan:

## 2022-09-11 NOTE — Progress Notes (Signed)
Appt scheduled for 09/13/2022

## 2022-09-13 ENCOUNTER — Ambulatory Visit: Payer: 59 | Attending: Cardiology | Admitting: Cardiology

## 2022-09-13 ENCOUNTER — Encounter: Payer: Self-pay | Admitting: Cardiology

## 2022-09-13 VITALS — BP 140/90 | HR 75 | Ht 66.0 in | Wt 205.7 lb

## 2022-09-13 DIAGNOSIS — M79603 Pain in arm, unspecified: Secondary | ICD-10-CM

## 2022-09-13 DIAGNOSIS — I2089 Other forms of angina pectoris: Secondary | ICD-10-CM

## 2022-09-13 DIAGNOSIS — E7800 Pure hypercholesterolemia, unspecified: Secondary | ICD-10-CM

## 2022-09-13 DIAGNOSIS — R079 Chest pain, unspecified: Secondary | ICD-10-CM

## 2022-09-13 DIAGNOSIS — I10 Essential (primary) hypertension: Secondary | ICD-10-CM | POA: Diagnosis not present

## 2022-09-13 MED ORDER — IVABRADINE HCL 5 MG PO TABS: ORAL_TABLET | ORAL | 0 refills | Status: DC

## 2022-09-13 MED ORDER — METOPROLOL TARTRATE 100 MG PO TABS: 100.0000 mg | ORAL_TABLET | Freq: Once | ORAL | 0 refills | Status: DC

## 2022-09-13 NOTE — Progress Notes (Signed)
Cardiology Office Note:    Date:  09/13/2022   ID:  Sharon, Shepherd 31-Aug-1950, MRN [id number]  PCP:  Sharon Carbon, MD   [place]  Cardiologist:  Sharon Sable, MD  Advanced Practice Provider:  No care team member to display Electrophysiologist:  None       Referring MD: Sharon Carbon, MD   Chief Complaint  Patient presents with   Follow-up    L Arm Pain. "Twinge" in chest    History of Present Illness:    Sharon Shepherd is a 72 y.o. female with a hx of hypertension, hyperlipidemia who presents for left arm pain.  States having left arm numbness/heaviness last week while sitting at home.  Also noticed a change in her chest.  States being stressed out lately with a lot of family members having health issues, husband had stents placed.  Blood pressures have been more elevated of late, valsartan was increased/titrated, she has not started taking newly prescribed medications.  Previously seen almost 2 years ago for symptoms of syncope.  Workup with echo and cardiac monitor was unrevealing.  Prior notes Echo 10/2020 EF 60 to 65% Cardiac monitor 10/2020 no significant arrhythmias sister has a history of WPW.  Father had MI in his 70s, mother MI in her 12s.  Past Medical History:  Diagnosis Date   Allergy    Anemia    Celiac disease    COVID 09/25/2020   Diverticulitis    GERD (gastroesophageal reflux disease)    GIST (gastrointestinal stroma tumor), malignant, colon ([place])    Hyperlipidemia    Hypertension    Sleep disturbance    Thyroiditis, lymphocytic    surgery 2013   Vitamin B12 deficiency    unresponsive to oral replacement    Past Surgical History:  Procedure Laterality Date   COLONOSCOPY     ESOPHAGOGASTRODUODENOSCOPY  03/04   colon   ESOPHAGOGASTRODUODENOSCOPY N/A 02/15/2021   Procedure: ESOPHAGOGASTRODUODENOSCOPY (EGD);  Surgeon: Sharon Banister, MD;  Location: [facility];  Service: Endoscopy;   Laterality: N/A;   ESOPHAGOGASTRODUODENOSCOPY N/A 06/20/2022   Procedure: ESOPHAGOGASTRODUODENOSCOPY (EGD);  Surgeon: Sharon Shepherd., MD;  Location: [facility];  Service: Gastroenterology;  Laterality: N/A;   EUS N/A 02/15/2021   Procedure: UPPER ENDOSCOPIC ULTRASOUND (EUS) RADIAL;  Surgeon: Sharon Banister, MD;  Location: [facility];  Service: Endoscopy;  Laterality: N/A;   EUS N/A 06/20/2022   Procedure: UPPER ENDOSCOPIC ULTRASOUND (EUS) RADIAL;  Surgeon: Sharon Shepherd., MD;  Location: [facility];  Service: Gastroenterology;  Laterality: N/A;   FINE NEEDLE ASPIRATION N/A 02/15/2021   Procedure: FINE NEEDLE ASPIRATION (FNA) LINEAR;  Surgeon: Sharon Banister, MD;  Location: [facility];  Service: Endoscopy;  Laterality: N/A;   Laryngeal implant  2/13   after damage during thyroidectomy   THYROIDECTOMY  12/12   Lymphocytic thyroiditis with goiter--Dr Rochel Brome    Current Medications: Current Meds  Medication Sig   albuterol (VENTOLIN HFA) 108 (90 Base) MCG/ACT inhaler Inhale 2 puffs into the lungs every 6 (six) hours as needed for wheezing or shortness of breath.   aspirin 81 MG chewable tablet Chew 81 mg by mouth every other day. In the evening   atorvastatin (LIPITOR) 20 MG tablet TAKE 1 TABLET BY MOUTH  DAILY   calcium carbonate (TUMS - DOSED IN MG ELEMENTAL CALCIUM) 500 MG chewable tablet Chew 2 tablets by mouth daily as needed for indigestion or heartburn.   cetirizine (ZYRTEC) 10 MG  tablet TAKE ONE TABLET BY MOUTH EVERY DAY   Cholecalciferol (VITAMIN D3) 5000 UNITS CAPS Take 5,000 Units by mouth at bedtime.   diazepam (VALIUM) 5 MG tablet Take 1 tablet (5 mg total) by mouth 3 (three) times daily as needed. For severe vertigo   ibuprofen (ADVIL) 200 MG tablet Take 400 mg by mouth every 8 (eight) hours as needed (pain.).   ivabradine (CORLANOR) 5 MG TABS tablet Take 2 tablets (10 mg) by mouth two hours prior to Cardiac CTA   levothyroxine (SYNTHROID) 50 MCG  tablet TAKE 1 TABLET BY MOUTH DAILY   LINZESS 290 MCG CAPS capsule TAKE ONE CAPSULE BY MOUTH EVERY OTHER DAY IN THE MORNING.   LORazepam (ATIVAN) 0.5 MG tablet Take 0.5 mg by mouth every 8 (eight) hours as needed.   meclizine (ANTIVERT) 25 MG tablet Take 1-2 tablets (25-50 mg total) by mouth 3 (three) times daily as needed. For vertigo (Patient taking differently: Take 25-50 mg by mouth 3 (three) times daily as needed (dizziness/vertigo).)   metoprolol tartrate (LOPRESSOR) 100 MG tablet Take 1 tablet (100 mg total) by mouth once for 1 dose. TWO hours prior to cardiac ct   Syringe/Needle, Disp, 25G X 1-1/2" 1 ML MISC 1 each by Does not apply route every 30 (thirty) days.   valsartan-hydrochlorothiazide (DIOVAN-HCT) 320-12.5 MG tablet Take 1 tablet by mouth daily. (Patient taking differently: Take 1 tablet by mouth daily. Currently taking 160-12.'5mg'$ )     Allergies:   Codeine   Social History   Socioeconomic History   Marital status: Married    Spouse name: Not on file   Number of children: 2   Years of education: Not on file   Highest education level: Not on file  Occupational History   Occupation: Building control surveyor: [employer]  Tobacco Use   Smoking status: Never    Passive exposure: Past (as a child)   Smokeless tobacco: Never  Vaping Use   Vaping Use: Never used  Substance and Sexual Activity   Alcohol use: Not Currently    Comment: rare   Drug use: No   Sexual activity: Not on file  Other Topics Concern   Not on file  Social History Narrative   The patient is married and has 2 children.   She is employed in Librarian, academic at [employer]   Rare alcohol and never tobacco   Social Determinants of Radio broadcast assistant Strain: Not on file  Food Insecurity: Not on file  Transportation Needs: Not on file  Physical Activity: Not on file  Stress: Not on file  Social Connections: Not on file     Family History: The patient's family history includes Heart attack in her  mother; Other in her father. There is no history of Colon cancer, Breast cancer, Esophageal cancer, Stomach cancer, or Rectal cancer.  ROS:   Please see the history of present illness.     All other systems reviewed and are negative.  EKGs/Labs/Other Studies Reviewed:    The following studies were reviewed today:   EKG:  EKG is ordered.  EKG shows normal sinus rhythm.  Recent Labs: 09/21/2021: BUN 18; Creatinine, Ser 0.94; Potassium 3.3; Sodium 137 10/18/2021: ALT 25 01/29/2022: Hemoglobin 13.9; Platelets 180 08/21/2022: TSH 11.500  Recent Lipid Panel    Component Value Date/Time   CHOL 146 01/29/2022 1118   TRIG 99 01/29/2022 1118   HDL 53 01/29/2022 1118   CHOLHDL 2.8 01/29/2022 1118   LDLCALC 75  01/29/2022 1118     Risk Assessment/Calculations:      Physical Exam:    VS:  BP (!) 140/90 (BP Location: Left Arm, Patient Position: Sitting, Cuff Size: Normal)   Pulse 75   Ht '5\' 6"'$  (1.676 m)   Wt 205 lb 11 oz (93.3 kg)   SpO2 98%   BMI 33.20 kg/m     Wt Readings from Last 3 Encounters:  09/13/22 205 lb 11 oz (93.3 kg)  09/10/22 206 lb (93.4 kg)  06/12/22 205 lb 14.6 oz (93.4 kg)     GEN:  Well nourished, well developed in no acute distress HEENT: Normal NECK: No JVD; No carotid bruits CARDIAC: RRR, no murmurs, rubs, gallops RESPIRATORY:  Clear to auscultation without rales, wheezing or rhonchi  ABDOMEN: Soft, non-tender, non-distended MUSCULOSKELETAL:  No edema; No deformity  SKIN: Warm and dry NEUROLOGIC:  Alert and oriented x 3 PSYCHIATRIC:  Normal affect   ASSESSMENT:    1. Pain of upper extremity, unspecified laterality   2. Primary hypertension   3. Pure hypercholesterolemia   4. Anginal equivalent   5. Chest pain, unspecified type    PLAN:    In order of problems listed above:  2020, left arm heaviness, change in chest.  Previous echo with normal EF, obtain coronary CTA to evaluate CAD. Hypertension, BP elevated.  Has not taking newly prescribed  BP medication.  Advised to take meds as prescribed.  Valsartan 320 mg, HCTZ. Hyperlipidemia, cholesterol controlled, continue Lipitor  Follow-up in 6 weeks.     Medication Adjustments/Labs and Tests Ordered: Current medicines are reviewed at length with the patient today.  Concerns regarding medicines are outlined above.  Orders Placed This Encounter  Procedures   CT CORONARY MORPH W/CTA COR W/SCORE W/CA W/CM &/OR WO/CM   Basic Metabolic Panel (BMET)   EKG 12-Lead   Meds ordered this encounter  Medications   metoprolol tartrate (LOPRESSOR) 100 MG tablet    Sig: Take 1 tablet (100 mg total) by mouth once for 1 dose. TWO hours prior to cardiac ct    Dispense:  1 tablet    Refill:  0   ivabradine (CORLANOR) 5 MG TABS tablet    Sig: Take 2 tablets (10 mg) by mouth two hours prior to Cardiac CTA    Dispense:  2 tablet    Refill:  0    Patient Instructions  Medication Instructions:   Your physician recommends that you continue on your current medications as directed. Please refer to the Current Medication list given to you today.  *If you need a refill on your cardiac medications before your next appointment, please call your pharmacy*   Lab Work:  You physician recommends that you go to the medical mall for lab work.   If you have labs (blood work) drawn today and your tests are completely normal, you will receive your results only by: [place] (if you have MyChart) OR A paper copy in the mail If you have any lab test that is abnormal or we need to change your treatment, we will call you to review the results.   Testing/Procedures:    Your cardiac CT will be scheduled at: [facility] [address] [place], [place] [address] [phone]  If scheduled at [facility] or [facility], please arrive 15 mins early for check-in and test prep.   Please follow these  instructions carefully (unless otherwise directed):  On the Night  Before the Test: Be sure to Drink plenty of water. Do not consume any caffeinated/decaffeinated beverages or chocolate 12 hours prior to your test. Do not take any antihistamines 12 hours prior to your test.   On the Day of the Test: Drink plenty of water until 1 hour prior to the test. Do not eat any food 1 hour prior to test. You may take your regular medications prior to the test.  Take metoprolol (Lopressor) two hours prior to test. Take Corlanor two hours prior test FEMALES- please wear underwire-free bra if available, avoid dresses & tight clothing      After the Test: Drink plenty of water. After receiving IV contrast, you may experience a mild flushed feeling. This is normal. On occasion, you may experience a mild rash up to 24 hours after the test. This is not dangerous. If this occurs, you can take Benadryl 25 mg and increase your fluid intake. If you experience trouble breathing, this can be serious. If it is severe call 911 IMMEDIATELY. If it is mild, please call our office.   We will call to schedule your test 2-4 weeks out understanding that some insurance companies will need an authorization prior to the service being performed.   For scheduling needs, including cancellations and rescheduling, please call [place], [phone].    Follow-Up: At [facility], you and your health needs are our priority.  As part of our continuing mission to provide you with exceptional heart care, we have created designated Provider Care Teams.  These Care Teams include your primary Cardiologist (physician) and Advanced Practice Providers (APPs -  Physician Assistants and Nurse Practitioners) who all work together to provide you with the care you need, when you need it.  We recommend signing up for the patient portal called "MyChart".  Sign up information is provided on this After Visit Summary.  MyChart is used  to connect with patients for Virtual Visits (Telemedicine).  Patients are able to view lab/test results, encounter notes, upcoming appointments, etc.  Non-urgent messages can be sent to your provider as well.   To learn more about what you can do with MyChart, go to [email or web address].    Your next appointment:    After Cardiac Testing  Provider:   You may see Sharon Sable, MD or one of the following Advanced Practice Providers on your designated Care Team:   Murray Hodgkins, NP Christell Faith, PA-C Cadence Kathlen Mody, PA-C Gerrie Nordmann, NP    Signed, Sharon Sable, MD  09/13/2022 4:09 PM    [place]

## 2022-09-13 NOTE — Patient Instructions (Signed)
Medication Instructions:   Your physician recommends that you continue on your current medications as directed. Please refer to the Current Medication list given to you today.  *If you need a refill on your cardiac medications before your next appointment, please call your pharmacy*   Lab Work:  You physician recommends that you go to the medical mall for lab work.   If you have labs (blood work) drawn today and your tests are completely normal, you will receive your results only by: [place] (if you have MyChart) OR A paper copy in the mail If you have any lab test that is abnormal or we need to change your treatment, we will call you to review the results.   Testing/Procedures:    Your cardiac CT will be scheduled at: [facility] [address] [place], [place] [address] [phone]  If scheduled at [facility] or [facility], please arrive 15 mins early for check-in and test prep.   Please follow these instructions carefully (unless otherwise directed):  On the Night Before the Test: Be sure to Drink plenty of water. Do not consume any caffeinated/decaffeinated beverages or chocolate 12 hours prior to your test. Do not take any antihistamines 12 hours prior to your test.   On the Day of the Test: Drink plenty of water until 1 hour prior to the test. Do not eat any food 1 hour prior to test. You may take your regular medications prior to the test.  Take metoprolol (Lopressor) two hours prior to test. Take Corlanor two hours prior test FEMALES- please wear underwire-free bra if available, avoid dresses & tight clothing      After the Test: Drink plenty of water. After receiving IV contrast, you may experience a mild flushed feeling. This is normal. On occasion, you may experience a mild rash up to 24 hours after the test. This is not dangerous. If this occurs, you can  take Benadryl 25 mg and increase your fluid intake. If you experience trouble breathing, this can be serious. If it is severe call 911 IMMEDIATELY. If it is mild, please call our office.   We will call to schedule your test 2-4 weeks out understanding that some insurance companies will need an authorization prior to the service being performed.   For scheduling needs, including cancellations and rescheduling, please call [place], [phone].    Follow-Up: At [facility], you and your health needs are our priority.  As part of our continuing mission to provide you with exceptional heart care, we have created designated Provider Care Teams.  These Care Teams include your primary Cardiologist (physician) and Advanced Practice Providers (APPs -  Physician Assistants and Nurse Practitioners) who all work together to provide you with the care you need, when you need it.  We recommend signing up for the patient portal called "MyChart".  Sign up information is provided on this After Visit Summary.  MyChart is used to connect with patients for Virtual Visits (Telemedicine).  Patients are able to view lab/test results, encounter notes, upcoming appointments, etc.  Non-urgent messages can be sent to your provider as well.   To learn more about what you can do with MyChart, go to [email or web address].    Your next appointment:    After Cardiac Testing  Provider:   You may see Kate Sable, MD or one of the following Advanced Practice Providers on your designated Care Team:   Murray Hodgkins, NP  Christell Faith, PA-C Cadence Furth, PA-C Gerrie Nordmann, NP

## 2022-09-26 ENCOUNTER — Other Ambulatory Visit: Payer: Self-pay | Admitting: Cardiology

## 2022-09-27 LAB — BASIC METABOLIC PANEL
BUN/Creatinine Ratio: 19 (ref 12–28)
BUN: 15 mg/dL (ref 8–27)
CO2: 22 mmol/L (ref 20–29)
Calcium: 9.4 mg/dL (ref 8.7–10.3)
Chloride: 104 mmol/L (ref 96–106)
Creatinine, Ser: 0.81 mg/dL (ref 0.57–1.00)
Glucose: 90 mg/dL (ref 70–99)
Potassium: 3.6 mmol/L (ref 3.5–5.2)
Sodium: 142 mmol/L (ref 134–144)
eGFR: 79 mL/min/{1.73_m2} (ref >59)

## 2022-10-02 ENCOUNTER — Telehealth (HOSPITAL_COMMUNITY): Payer: Self-pay | Admitting: Emergency Medicine

## 2022-10-02 NOTE — Telephone Encounter (Signed)
Reaching out to patient to offer assistance regarding upcoming cardiac imaging study; pt verbalizes understanding of appt date/time, parking situation and where to check in, pre-test NPO status and medications ordered, and verified current allergies; name and call back number provided for further questions should they arise Marchia Bond RN Navigator Cardiac Imaging [facility] [phone] office [phone] cell  [place]  Denies iv issues 111m metop + 142mivabradine [email or web address]

## 2022-10-03 ENCOUNTER — Ambulatory Visit
Admission: RE | Admit: 2022-10-03 | Discharge: 2022-10-03 | Disposition: A | Discharge status: Home / Self Care | Payer: 59 | Source: Ambulatory Visit | Attending: Cardiology | Admitting: Cardiology

## 2022-10-03 DIAGNOSIS — R079 Chest pain, unspecified: Secondary | ICD-10-CM | POA: Diagnosis not present

## 2022-10-03 MED ORDER — IOHEXOL 350 MG/ML SOLN
100.0000 mL | Freq: Once | INTRAVENOUS | Status: AC | PRN
  Administered 2022-10-03: 100 mL via INTRAVENOUS

## 2022-10-03 MED ORDER — NITROGLYCERIN 0.4 MG SL SUBL
0.8000 mg | SUBLINGUAL_TABLET | Freq: Once | SUBLINGUAL | Status: AC
  Administered 2022-10-03: 0.8 mg via SUBLINGUAL

## 2022-10-03 NOTE — Progress Notes (Signed)
Patient tolerated CT well. Drank water after. Vital signs stable encourage to drink water throughout day.Reasons explained and verbalized understanding. Ambulated steady gait.  

## 2022-11-01 ENCOUNTER — Other Ambulatory Visit: Payer: Self-pay | Admitting: Internal Medicine

## 2022-11-13 ENCOUNTER — Encounter: Payer: Self-pay | Admitting: Internal Medicine

## 2022-11-13 ENCOUNTER — Other Ambulatory Visit: Payer: Self-pay | Admitting: Internal Medicine

## 2022-11-25 ENCOUNTER — Other Ambulatory Visit: Payer: Self-pay | Admitting: Internal Medicine

## 2023-01-26 ENCOUNTER — Other Ambulatory Visit: Payer: Self-pay | Admitting: Internal Medicine

## 2023-01-28 NOTE — Progress Notes (Unsigned)
    Sharon Shepherd T. Sharon Kant, MD, CAQ Sports Medicine [facility] [address] [place], [address]  Phone: [phone]  FAX: [phone]  Sharon Shepherd - 72 y.o. female  MRN [id number]  Date of Birth: 03-Mar-1951  Date: 01/29/2023  PCP: Karie Schwalbe, MD  Referral: Karie Schwalbe, MD  No chief complaint on file.  Subjective:   Sharon Shepherd is a 72 y.o. very pleasant female patient with There is no height or weight on file to calculate BMI. who presents with the following:  She is a pleasant patient, and she presents with some ongoing finger pain and trigger finger.  I did see her previously almost 1 year ago for trigger finger on the right fourth digit, that point I did do a trigger finger injection.    Review of Systems is noted in the HPI, as appropriate  Objective:   There were no vitals taken for this visit.  GEN: No acute distress; alert,appropriate. PULM: Breathing comfortably in no respiratory distress PSYCH: Normally interactive.   Laboratory and Imaging Data:  Assessment and Plan:   ***

## 2023-01-29 ENCOUNTER — Ambulatory Visit: Payer: 59 | Admitting: Family Medicine

## 2023-01-29 ENCOUNTER — Encounter: Payer: Self-pay | Admitting: Family Medicine

## 2023-01-29 VITALS — BP 110/76 | HR 69 | Temp 97.7°F | Ht 66.0 in | Wt 205.1 lb

## 2023-01-29 DIAGNOSIS — M65341 Trigger finger, right ring finger: Secondary | ICD-10-CM | POA: Diagnosis not present

## 2023-01-29 MED ORDER — TRIAMCINOLONE ACETONIDE 40 MG/ML IJ SUSP
20.0000 mg | Freq: Once | INTRAMUSCULAR | Status: AC
Start: 2023-01-29 — End: 2023-01-29
  Administered 2023-01-29: 20 mg via INTRA_ARTICULAR

## 2023-01-31 ENCOUNTER — Encounter: Payer: Self-pay | Admitting: Internal Medicine

## 2023-01-31 ENCOUNTER — Ambulatory Visit (INDEPENDENT_AMBULATORY_CARE_PROVIDER_SITE_OTHER): Payer: 59 | Admitting: Internal Medicine

## 2023-01-31 VITALS — BP 134/88 | HR 62 | Temp 97.2°F | Ht 65.0 in | Wt 204.0 lb

## 2023-01-31 DIAGNOSIS — Z0000 Encounter for general adult medical examination without abnormal findings: Secondary | ICD-10-CM | POA: Diagnosis not present

## 2023-01-31 DIAGNOSIS — I10 Essential (primary) hypertension: Secondary | ICD-10-CM | POA: Diagnosis not present

## 2023-01-31 DIAGNOSIS — K5909 Other constipation: Secondary | ICD-10-CM

## 2023-01-31 DIAGNOSIS — E890 Postprocedural hypothyroidism: Secondary | ICD-10-CM | POA: Diagnosis not present

## 2023-01-31 DIAGNOSIS — K900 Celiac disease: Secondary | ICD-10-CM

## 2023-01-31 DIAGNOSIS — E538 Deficiency of other specified B group vitamins: Secondary | ICD-10-CM

## 2023-01-31 DIAGNOSIS — C49A2 Gastrointestinal stromal tumor of stomach: Secondary | ICD-10-CM | POA: Diagnosis not present

## 2023-01-31 NOTE — Assessment & Plan Note (Signed)
BP Readings from Last 3 Encounters:  01/31/23 134/88  01/29/23 110/76  10/03/22 109/69   Controlled with valsartan/hydrochlorothiazide 320/12.5

## 2023-01-31 NOTE — Assessment & Plan Note (Signed)
Healthy Colon due in 2032--will consider Yearly mammogram till 23 at least No pap due to age Discussed increased exercise Prefers no COVID vaccine Flu/RSV in the fall

## 2023-01-31 NOTE — Assessment & Plan Note (Addendum)
Does well with the linzess 290mg  daily

## 2023-01-31 NOTE — Assessment & Plan Note (Signed)
Just taking oral now If low again, will need to go back to injections

## 2023-01-31 NOTE — Progress Notes (Signed)
Subjective:    Patient ID: Sharon Shepherd, female    DOB: 1950-08-27, 72 y.o.   MRN: [id number]  HPI Here for physical  No new concerns Still working  Will need EGD again later this year for the GIST tumor No abdominal pain No heartburn Bowels are fine with the linzess  Rare vertigo Still has some diazepam for prn use  Gardens but not exercising otherwise  Current Outpatient Medications on File Prior to Visit  Medication Sig Dispense Refill   albuterol (VENTOLIN HFA) 108 (90 Base) MCG/ACT inhaler Inhale 2 puffs into the lungs every 6 (six) hours as needed for wheezing or shortness of breath. 8 g 0   aspirin 81 MG chewable tablet Chew 81 mg by mouth every other day. In the evening     atorvastatin (LIPITOR) 20 MG tablet TAKE 1 TABLET BY MOUTH ONCE  DAILY 90 tablet 3   cetirizine (ZYRTEC) 10 MG tablet TAKE 1 TABLET BY MOUTH DAILY. 90 tablet 3   Cholecalciferol (VITAMIN D3) 5000 UNITS CAPS Take 5,000 Units by mouth at bedtime.     diazepam (VALIUM) 5 MG tablet Take 1 tablet (5 mg total) by mouth 3 (three) times daily as needed. For severe vertigo 30 tablet 0   levothyroxine (SYNTHROID) 50 MCG tablet TAKE 1 TABLET BY MOUTH DAILY 90 tablet 3   LINZESS 290 MCG CAPS capsule TAKE ONE CAPSULE BY MOUTH EVERY OTHER DAY IN THE MORNING 90 capsule 1   meclizine (ANTIVERT) 25 MG tablet Take 1-2 tablets (25-50 mg total) by mouth 3 (three) times daily as needed. For vertigo (Patient taking differently: Take 25-50 mg by mouth 3 (three) times daily as needed (dizziness/vertigo).) 60 tablet 0   Syringe/Needle, Disp, 25G X 1-1/2" 1 ML MISC 1 each by Does not apply route every 30 (thirty) days. 1 each 11   valsartan-hydrochlorothiazide (DIOVAN-HCT) 320-12.5 MG tablet Take 1 tablet by mouth daily. (Patient taking differently: Take 1 tablet by mouth daily. Currently taking 160-12.5mg ) 90 tablet 3   No current facility-administered medications on file prior to visit.    Allergies  Allergen  Reactions   Codeine Itching    Past Medical History:  Diagnosis Date   Allergy    Anemia    Celiac disease    COVID 09/25/2020   Diverticulitis    GERD (gastroesophageal reflux disease)    GIST (gastrointestinal stroma tumor), malignant, colon (HCC)    Hyperlipidemia    Hypertension    Sleep disturbance    Thyroiditis, lymphocytic    surgery 2013   Vitamin B12 deficiency    unresponsive to oral replacement    Past Surgical History:  Procedure Laterality Date   COLONOSCOPY     ESOPHAGOGASTRODUODENOSCOPY  03/04   colon   ESOPHAGOGASTRODUODENOSCOPY N/A 02/15/2021   Procedure: ESOPHAGOGASTRODUODENOSCOPY (EGD);  Surgeon: Rachael Fee, MD;  Location: [facility];  Service: Endoscopy;  Laterality: N/A;   ESOPHAGOGASTRODUODENOSCOPY N/A 06/20/2022   Procedure: ESOPHAGOGASTRODUODENOSCOPY (EGD);  Surgeon: Lemar Lofty., MD;  Location: [facility];  Service: Gastroenterology;  Laterality: N/A;   EUS N/A 02/15/2021   Procedure: UPPER ENDOSCOPIC ULTRASOUND (EUS) RADIAL;  Surgeon: Rachael Fee, MD;  Location: [facility];  Service: Endoscopy;  Laterality: N/A;   EUS N/A 06/20/2022   Procedure: UPPER ENDOSCOPIC ULTRASOUND (EUS) RADIAL;  Surgeon: Lemar Lofty., MD;  Location: [facility];  Service: Gastroenterology;  Laterality: N/A;   FINE NEEDLE ASPIRATION N/A 02/15/2021   Procedure: FINE NEEDLE ASPIRATION (FNA) LINEAR;  Surgeon: Christella Hartigan,  Melton Alar, MD;  Location: [facility];  Service: Endoscopy;  Laterality: N/A;   Laryngeal implant  2/13   after damage during thyroidectomy   THYROIDECTOMY  12/12   Lymphocytic thyroiditis with goiter--Dr Renda Rolls    Family History  Problem Relation Age of Onset   Other Father        pancreatic absess   Heart attack Mother    Colon cancer Neg Hx    Breast cancer Neg Hx    Esophageal cancer Neg Hx    Stomach cancer Neg Hx    Rectal cancer Neg Hx     Social History   Socioeconomic History   Marital status: Married     Spouse name: Not on file   Number of children: 2   Years of education: Not on file   Highest education level: Not on file  Occupational History   Occupation: Primary school teacher: [employer]  Tobacco Use   Smoking status: Never    Passive exposure: Past (as a child)   Smokeless tobacco: Never  Vaping Use   Vaping Use: Never used  Substance and Sexual Activity   Alcohol use: Not Currently    Comment: rare   Drug use: No   Sexual activity: Not on file  Other Topics Concern   Not on file  Social History Narrative   The patient is married and has 2 children.   She is employed in Firefighter at [employer]   Rare alcohol and never tobacco   Social Determinants of Corporate investment banker Strain: Not on file  Food Insecurity: Not on file  Transportation Needs: Not on file  Physical Activity: Not on file  Stress: Not on file  Social Connections: Not on file  Intimate Partner Violence: Not on file   Review of Systems  Constitutional:  Negative for fatigue and unexpected weight change.       Wears seat belt  HENT:  Positive for tinnitus. Negative for dental problem and hearing loss.        Keeps up with dentist  Eyes:  Negative for visual disturbance.       No diplopia or unilateral vision loss  Respiratory:  Negative for cough, chest tightness and shortness of breath.   Cardiovascular:  Negative for chest pain, palpitations and leg swelling.  Gastrointestinal:  Negative for blood in stool and constipation.       No heartburn  Endocrine: Negative for polydipsia and polyuria.  Genitourinary:  Negative for dyspareunia, dysuria and hematuria.  Musculoskeletal:  Negative for back pain and joint swelling.       Got trigger finger injection right 4th  Skin:  Negative for rash.  Allergic/Immunologic: Positive for environmental allergies. Negative for immunocompromised state.       Cetirizine year round  Neurological:  Negative for dizziness, syncope, light-headedness and  headaches.  Hematological:  Negative for adenopathy. Does not bruise/bleed easily.  Psychiatric/Behavioral:  Negative for dysphoric mood and sleep disturbance. The patient is not nervous/anxious.        Chronic sleep problems---no noted apnea        Objective:   Physical Exam Constitutional:      Appearance: Normal appearance.  HENT:     Mouth/Throat:     Pharynx: No oropharyngeal exudate or posterior oropharyngeal erythema.  Eyes:     Conjunctiva/sclera: Conjunctivae normal.     Pupils: Pupils are equal, round, and reactive to light.  Cardiovascular:     Rate and  Rhythm: Normal rate and regular rhythm.     Pulses: Normal pulses.     Heart sounds: No murmur heard.    No gallop.  Pulmonary:     Effort: Pulmonary effort is normal.     Breath sounds: Normal breath sounds. No wheezing or rales.  Abdominal:     Palpations: Abdomen is soft.     Tenderness: There is no abdominal tenderness.  Musculoskeletal:     Cervical back: Neck supple.     Right lower leg: No edema.     Left lower leg: No edema.  Lymphadenopathy:     Cervical: No cervical adenopathy.  Skin:    Findings: No rash.  Neurological:     General: No focal deficit present.     Mental Status: She is alert and oriented to person, place, and time.  Psychiatric:        Mood and Affect: Mood normal.        Behavior: Behavior normal.            Assessment & Plan:

## 2023-01-31 NOTE — Assessment & Plan Note (Signed)
Will be due for EGD again later this year

## 2023-01-31 NOTE — Assessment & Plan Note (Signed)
Seems euthyroid on levothyroxine 50

## 2023-01-31 NOTE — Assessment & Plan Note (Signed)
Okay with gluten restriction

## 2023-02-01 LAB — LIPID PANEL
Chol/HDL Ratio: 2.4 ratio (ref 0.0–4.4)
Cholesterol, Total: 142 mg/dL (ref 100–199)
HDL: 58 mg/dL (ref >39)
LDL Chol Calc (NIH): 69 mg/dL (ref 0–99)
Triglycerides: 75 mg/dL (ref 0–149)
VLDL Cholesterol Cal: 15 mg/dL (ref 5–40)

## 2023-02-01 LAB — COMPREHENSIVE METABOLIC PANEL
ALT: 18 IU/L (ref 0–32)
AST: 25 IU/L (ref 0–40)
Albumin: 4.3 g/dL (ref 3.9–4.9)
Alkaline Phosphatase: 81 IU/L (ref 44–121)
BUN/Creatinine Ratio: 18 (ref 12–28)
BUN: 13 mg/dL (ref 8–27)
Bilirubin Total: 0.8 mg/dL (ref 0.0–1.2)
CO2: 25 mmol/L (ref 20–29)
Calcium: 9.4 mg/dL (ref 8.7–10.3)
Chloride: 104 mmol/L (ref 96–106)
Creatinine, Ser: 0.73 mg/dL (ref 0.57–1.00)
Globulin, Total: 2.3 g/dL (ref 1.5–4.5)
Glucose: 84 mg/dL (ref 70–99)
Potassium: 4.1 mmol/L (ref 3.5–5.2)
Sodium: 142 mmol/L (ref 134–144)
Total Protein: 6.6 g/dL (ref 6.0–8.5)
eGFR: 89 mL/min/{1.73_m2} (ref >59)

## 2023-02-01 LAB — CBC
Hematocrit: 41.3 % (ref 34.0–46.6)
Hemoglobin: 13.2 g/dL (ref 11.1–15.9)
MCH: 27.1 pg (ref 26.6–33.0)
MCHC: 32 g/dL (ref 31.5–35.7)
MCV: 85 fL (ref 79–97)
Platelets: 181 10*3/uL (ref 150–450)
RBC: 4.87 x10E6/uL (ref 3.77–5.28)
RDW: 12.8 % (ref 11.7–15.4)
WBC: 4.4 10*3/uL (ref 3.4–10.8)

## 2023-02-01 LAB — TSH: TSH: 1.73 u[IU]/mL (ref 0.450–4.500)

## 2023-02-01 LAB — IRON,TIBC AND FERRITIN PANEL
Ferritin: 17 ng/mL (ref 15–150)
Iron Saturation: 16 % (ref 15–55)
Iron: 68 ug/dL (ref 27–139)
Total Iron Binding Capacity: 428 ug/dL (ref 250–450)
UIBC: 360 ug/dL (ref 118–369)

## 2023-02-01 LAB — T4, FREE: Free T4: 1.19 ng/dL (ref 0.82–1.77)

## 2023-02-01 LAB — VITAMIN B12: Vitamin B-12: 229 pg/mL — ABNORMAL LOW (ref 232–1245)

## 2023-02-05 MED ORDER — "SYRINGE/NEEDLE (DISP) 25G X 1-1/2"" 1 ML MISC": 1.0000 | 11 refills | Status: DC

## 2023-02-05 MED ORDER — CYANOCOBALAMIN 1000 MCG/ML IJ SOLN: 1000.0000 ug | INTRAMUSCULAR | 3 refills | Status: DC

## 2023-04-30 ENCOUNTER — Telehealth: Payer: Self-pay | Admitting: Internal Medicine

## 2023-04-30 NOTE — Telephone Encounter (Signed)
Amber from [employer] called in and stated that the manufacture for levothyroxine (SYNTHROID) 50 MCG tablet is changing and they are needing an ok for this. Reference number is [id number]. Thank you!

## 2023-04-30 NOTE — Telephone Encounter (Signed)
We received a fax with the same request. It has been given to Dr Alphonsus Sias to authorize. Will fax back when done.

## 2023-05-19 NOTE — Telephone Encounter (Signed)
[facility] contacted the office regarding this request again, advised they they did not receive a response regarding this. They are needing the OK from provider to switch manufacturer on this medication. Please advise, thanks!

## 2023-05-19 NOTE — Telephone Encounter (Signed)
Form was faxed with authorization 2 weeks ago.

## 2023-05-20 ENCOUNTER — Telehealth: Payer: Self-pay | Admitting: Internal Medicine

## 2023-05-20 NOTE — Telephone Encounter (Signed)
[facility] called in and stated that  levothyroxine (SYNTHROID) 50 MCG tablet is changing  to Amneal and need to know if its ok to change pt medication and would like a call back at [phone] ask for Cherly. Also RX has been trying to reach the patient but can not reach the patient phone just rings.

## 2023-05-21 NOTE — Telephone Encounter (Signed)
Faxed back, again

## 2023-05-21 NOTE — Telephone Encounter (Signed)
[facility] form faxed back, again.

## 2023-06-23 ENCOUNTER — Other Ambulatory Visit: Payer: Self-pay | Admitting: Internal Medicine

## 2023-08-14 ENCOUNTER — Encounter: Payer: Self-pay | Admitting: Oncology

## 2023-08-21 ENCOUNTER — Ambulatory Visit: Payer: 59 | Admitting: Internal Medicine

## 2023-08-21 ENCOUNTER — Other Ambulatory Visit: Payer: Self-pay | Admitting: Internal Medicine

## 2023-08-21 ENCOUNTER — Encounter: Payer: Self-pay | Admitting: Internal Medicine

## 2023-08-21 ENCOUNTER — Encounter: Payer: Self-pay | Admitting: Oncology

## 2023-08-21 VITALS — BP 104/70 | HR 99 | Ht 65.0 in | Wt 207.0 lb

## 2023-08-21 DIAGNOSIS — C49A2 Gastrointestinal stromal tumor of stomach: Secondary | ICD-10-CM | POA: Diagnosis not present

## 2023-08-21 NOTE — Progress Notes (Signed)
 Sharon Shepherd 72 y.o. 01/26/1951 [id number]  Assessment & Plan:   Encounter Diagnosis  Name Primary?   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 Wilhelmenia for him to arrange a repeat endoscopic ultrasound to reassess this lesion.     Subjective:  Gastroenterology summary:  GIST of stomach-submucosal lesion 12/29/2020 upper endoscopy,EUS 02/15/2021 1.6 cm submucosal lesion calcified consistent with GIST FNA cytology spindle cell neoplasm 06/20/2022 EUS with 1.8 cm submucosal lesion plan repeat exam 1 year  Lesion has also been seen on CT scan last in 10/01/2021, 10 mm on that.  Diverticulitis-last episode 10/01/2021  Iron  deficiency anemia evaluation May 2022-colonoscopy with 2 diminutive polyps (hyperplastic distal-not precancerous) recall 20 32  EGD with findings as above plus fundic gland polyps and hiatal hernia  Celiac disease-per patient diagnosed with serologies biopsies negative she maintains a gluten-free diet  Chronic constipation-on Linzess   --------------------------------------------------------------------------------------------------  Chief Complaint: Follow-up of GIST of the stomach  HPI 73 year old white woman here with husband, she has a history  of diverticulitis, iron  deficiency anemia and a small GIST of the stomach previously evaluated with EUS and CT scanning.   Last endoscopic ultrasound November 2023 with stable 18 mm lesion consistent with GIST on the lesser curve over the gastric body.  Dr. Wilhelmenia recommended a 1 year follow-up exam.  The patient is without GI complaints at this time.  She is suffering with and recovering from an upper respiratory tract infection. Allergies  Allergen Reactions   Codeine Itching   Current Meds  Medication Sig   albuterol  (VENTOLIN  HFA) 108 (90 Base) MCG/ACT inhaler Inhale 2 puffs into the lungs every 6 (six) hours as needed for wheezing or shortness of breath.    aspirin 81 MG chewable tablet Chew 81 mg by mouth every other day. In the evening   atorvastatin  (LIPITOR) 20 MG tablet TAKE 1 TABLET BY MOUTH ONCE  DAILY   cetirizine (ZYRTEC) 10 MG tablet TAKE 1 TABLET BY MOUTH DAILY.   Cholecalciferol (VITAMIN D3) 5000 UNITS CAPS Take 5,000 Units by mouth at bedtime.   cyanocobalamin  (VITAMIN B12) 1000 MCG/ML injection Inject 1 mL (1,000 mcg total) into the muscle every 30 (thirty) days.   diazepam  (VALIUM ) 5 MG tablet Take 1 tablet (5 mg total) by mouth 3 (three) times daily as needed. For severe vertigo   levothyroxine  (SYNTHROID ) 50 MCG tablet TAKE 1 TABLET BY MOUTH DAILY   LINZESS  290 MCG CAPS capsule TAKE ONE CAPSULE BY MOUTH EVERY OTHER DAY IN THE MORNING   meclizine  (ANTIVERT ) 25 MG tablet Take 1-2 tablets (25-50 mg total) by mouth 3 (three) times daily as needed. For vertigo (Patient taking differently: Take 25-50 mg by mouth 3 (three) times daily as needed (dizziness/vertigo).)   Syringe/Needle, Disp, 25G X 1-1/2 1 ML MISC 1 each by Does not apply route every 30 (thirty) days.   valsartan -hydrochlorothiazide  (DIOVAN -HCT) 320-12.5 MG tablet TAKE 1 TABLET BY MOUTH DAILY   Past Medical History:  Diagnosis Date   Allergy     Anemia    Celiac disease    COVID 09/25/2020   Diverticulitis    GERD (gastroesophageal reflux disease)    GIST (gastrointestinal stroma tumor), malignant, colon (HCC)    Hyperlipidemia    Hypertension    Sleep disturbance    Thyroiditis, lymphocytic    surgery 2013   Vitamin B12 deficiency    unresponsive to oral replacement   Past Surgical History:  Procedure Laterality Date  COLONOSCOPY     ESOPHAGOGASTRODUODENOSCOPY  03/04   colon   ESOPHAGOGASTRODUODENOSCOPY N/A 02/15/2021   Procedure: ESOPHAGOGASTRODUODENOSCOPY (EGD);  Surgeon: Teressa Toribio SQUIBB, MD;  Location: [facility];  Service: Endoscopy;  Laterality: N/A;   ESOPHAGOGASTRODUODENOSCOPY N/A 06/20/2022   Procedure: ESOPHAGOGASTRODUODENOSCOPY (EGD);  Surgeon:  Wilhelmenia Aloha Raddle., MD;  Location: [facility];  Service: Gastroenterology;  Laterality: N/A;   EUS N/A 02/15/2021   Procedure: UPPER ENDOSCOPIC ULTRASOUND (EUS) RADIAL;  Surgeon: Teressa Toribio SQUIBB, MD;  Location: [facility];  Service: Endoscopy;  Laterality: N/A;   EUS N/A 06/20/2022   Procedure: UPPER ENDOSCOPIC ULTRASOUND (EUS) RADIAL;  Surgeon: Wilhelmenia Aloha Raddle., MD;  Location: [facility];  Service: Gastroenterology;  Laterality: N/A;   FINE NEEDLE ASPIRATION N/A 02/15/2021   Procedure: FINE NEEDLE ASPIRATION (FNA) LINEAR;  Surgeon: Teressa Toribio SQUIBB, MD;  Location: [facility];  Service: Endoscopy;  Laterality: N/A;   Laryngeal implant  2/13   after damage during thyroidectomy   THYROIDECTOMY  12/12   Lymphocytic thyroiditis with goiter--Dr Unknown Sharps   Social History   Social History Narrative   The patient is married and has 2 children.   She is employed in firefighter at [employer]   Rare alcohol and never tobacco      No living will   Husband is health care POA--sons are alternates   Would accept resuscitation   Wouldn't want long term tube feeds   family history includes Heart attack in her mother; Other in her father.   Review of Systems As above  Objective:   Physical Exam BP 104/70   Pulse 99   Ht 5' 5 (1.651 m)   Wt 207 lb (93.9 kg)   BMI 34.45 kg/m

## 2023-08-21 NOTE — Patient Instructions (Signed)
 We will be in touch about setting you up for an EUS with Dr Aloha Finner.   _______________________________________________________  If your blood pressure at your visit was 140/90 or greater, please contact your primary care physician to follow up on this.  _______________________________________________________  If you are age 71 or older, your body mass index should be between 23-30. Your Body mass index is 34.45 kg/m. If this is out of the aforementioned range listed, please consider follow up with your Primary Care Provider.  If you are age 17 or younger, your body mass index should be between 19-25. Your Body mass index is 34.45 kg/m. If this is out of the aformentioned range listed, please consider follow up with your Primary Care Provider.   ________________________________________________________  The [facility] providers would like to encourage you to use MYCHART to communicate with providers for non-urgent requests or questions.  Due to long hold times on the telephone, sending your provider a message by [facility] may be a faster and more efficient way to get a response.  Please allow 48 business hours for a response.  Please remember that this is for non-urgent requests.  _______________________________________________________  I appreciate the opportunity to care for you. Lupita Commander, MD, [facility]

## 2023-08-22 ENCOUNTER — Telehealth: Payer: Self-pay

## 2023-08-22 NOTE — Telephone Encounter (Signed)
 Left message on machine to call back

## 2023-08-22 NOTE — Telephone Encounter (Signed)
-----   Message from [facility] sent at 08/21/2023  5:23 PM EST ----- Regarding: RE: EUS please CEG, Sounds good. Malachy Coleman, please schedule as able upper EUS. GM ----- Message ----- From: Avram Lupita BRAVO, MD Sent: 08/21/2023   1:38 PM EST To: Odetta LITTIE Curly, RN; Aloha Wilhelmenia Raddle., MD Subject: EUS please                                     Diann and Odetta,  This lady has a GIST that GM said to repeat EUS for 1 year after 11/23 EUS.  Seen in office today - made her aware it would likely be March or so (she is fine w/ that)  Thanks for your help.  She said a My Chart message would be a great way to inform her of appointment.  Thanks  CEG

## 2023-08-25 ENCOUNTER — Other Ambulatory Visit: Payer: Self-pay

## 2023-08-25 DIAGNOSIS — C49A2 Gastrointestinal stromal tumor of stomach: Secondary | ICD-10-CM

## 2023-08-25 NOTE — Telephone Encounter (Signed)
 EUS scheduled, pt instructed and medications reviewed.  Patient instructions mailed to home.  Patient to call with any questions or concerns.  The pt instructions were incorrect due to my error. I have discussed with the patient the correct time of 145 pm procedure and 1215 pm arrival time.  No further questions or concerns

## 2023-08-25 NOTE — Telephone Encounter (Signed)
 EUS has been set up for 10/23/23 at 145 pm at [facility] with GM   Left message on machine to call back

## 2023-10-15 ENCOUNTER — Telehealth: Payer: Self-pay

## 2023-10-15 NOTE — Telephone Encounter (Signed)
 Procedure:endo Procedure date: 10/23/23 Procedure location: [facility] Arrival Time: 1215 Spoke with the patient Y/N: y Any prep concerns? n  Has the patient obtained the prep from the pharmacy ? n Do you have a care partner and transportation: y Any additional concerns? Y about her insurance

## 2023-10-16 ENCOUNTER — Encounter (HOSPITAL_COMMUNITY): Payer: Self-pay | Admitting: Gastroenterology

## 2023-10-22 NOTE — Anesthesia Preprocedure Evaluation (Signed)
 Anesthesia Evaluation  Patient identified by MRN, date of birth, ID band Patient awake    Reviewed: Allergy & Precautions, NPO status , Patient's Chart, lab work & pertinent test results  Airway Mallampati: II  TM Distance: >3 FB Neck ROM: Full    Dental no notable dental hx.    Pulmonary asthma    Pulmonary exam normal        Cardiovascular hypertension, Pt. on medications  Rhythm:Regular Rate:Normal     Neuro/Psych negative neurological ROS  negative psych ROS   GI/Hepatic Neg liver ROS,GERD  ,,GIST   Endo/Other  Hypothyroidism    Renal/GU negative Renal ROS  negative genitourinary   Musculoskeletal negative musculoskeletal ROS (+)    Abdominal Normal abdominal exam  (+)   Peds  Hematology  (+) Blood dyscrasia, anemia   Anesthesia Other Findings   Reproductive/Obstetrics                             Anesthesia Physical Anesthesia Plan  ASA: 2  Anesthesia Plan: MAC   Post-op Pain Management:    Induction:   PONV Risk Score and Plan: 2 and Propofol infusion and Treatment may vary due to age or medical condition  Airway Management Planned: Simple Face Mask and Nasal Cannula  Additional Equipment: None  Intra-op Plan:   Post-operative Plan:   Informed Consent: I have reviewed the patients History and Physical, chart, labs and discussed the procedure including the risks, benefits and alternatives for the proposed anesthesia with the patient or authorized representative who has indicated his/her understanding and acceptance.     Dental advisory given  Plan Discussed with: CRNA  Anesthesia Plan Comments:        Anesthesia Quick Evaluation

## 2023-10-23 ENCOUNTER — Encounter (HOSPITAL_COMMUNITY)
Admission: RE | Disposition: A | Discharge status: Home / Self Care | Payer: Self-pay | Source: Home / Self Care | Attending: Gastroenterology

## 2023-10-23 ENCOUNTER — Other Ambulatory Visit: Payer: Self-pay

## 2023-10-23 ENCOUNTER — Telehealth: Payer: Self-pay

## 2023-10-23 ENCOUNTER — Ambulatory Visit (HOSPITAL_COMMUNITY): Admitting: Anesthesiology

## 2023-10-23 ENCOUNTER — Encounter (HOSPITAL_COMMUNITY): Payer: Self-pay | Admitting: Gastroenterology

## 2023-10-23 ENCOUNTER — Ambulatory Visit (HOSPITAL_COMMUNITY)
Admission: RE | Admit: 2023-10-23 | Discharge: 2023-10-23 | Disposition: A | Discharge status: Home / Self Care | Payer: 59 | Attending: Gastroenterology | Admitting: Gastroenterology

## 2023-10-23 DIAGNOSIS — K3189 Other diseases of stomach and duodenum: Secondary | ICD-10-CM | POA: Insufficient documentation

## 2023-10-23 DIAGNOSIS — C49A2 Gastrointestinal stromal tumor of stomach: Secondary | ICD-10-CM | POA: Insufficient documentation

## 2023-10-23 DIAGNOSIS — I899 Noninfective disorder of lymphatic vessels and lymph nodes, unspecified: Secondary | ICD-10-CM

## 2023-10-23 DIAGNOSIS — K2289 Other specified disease of esophagus: Secondary | ICD-10-CM | POA: Diagnosis not present

## 2023-10-23 DIAGNOSIS — Q399 Congenital malformation of esophagus, unspecified: Secondary | ICD-10-CM | POA: Insufficient documentation

## 2023-10-23 DIAGNOSIS — I10 Essential (primary) hypertension: Secondary | ICD-10-CM | POA: Insufficient documentation

## 2023-10-23 DIAGNOSIS — K449 Diaphragmatic hernia without obstruction or gangrene: Secondary | ICD-10-CM | POA: Insufficient documentation

## 2023-10-23 DIAGNOSIS — E039 Hypothyroidism, unspecified: Secondary | ICD-10-CM | POA: Diagnosis not present

## 2023-10-23 HISTORY — PX: EUS: SHX5427

## 2023-10-23 SURGERY — UPPER ENDOSCOPIC ULTRASOUND (EUS) RADIAL
Anesthesia: Monitor Anesthesia Care

## 2023-10-23 MED ORDER — LIDOCAINE 2% (20 MG/ML) 5 ML SYRINGE
INTRAMUSCULAR | Status: DC | PRN
Start: 2023-10-23 — End: 2023-10-23
  Administered 2023-10-23: 100 mg via INTRAVENOUS

## 2023-10-23 MED ORDER — PROPOFOL 1000 MG/100ML IV EMUL
INTRAVENOUS | Status: AC
  Filled 2023-10-23: qty 100

## 2023-10-23 MED ORDER — SODIUM CHLORIDE 0.9 % IV SOLN
INTRAVENOUS | Status: AC | PRN
  Administered 2023-10-23: 500 mL via INTRAVENOUS

## 2023-10-23 MED ORDER — PROPOFOL 500 MG/50ML IV EMUL
INTRAVENOUS | Status: DC | PRN
  Administered 2023-10-23: 150 ug/kg/min via INTRAVENOUS

## 2023-10-23 MED ORDER — SODIUM CHLORIDE 0.9 % IV SOLN: INTRAVENOUS | Status: DC | PRN

## 2023-10-23 MED ORDER — SODIUM CHLORIDE 0.9 % IV SOLN: INTRAVENOUS | Status: DC

## 2023-10-23 NOTE — Anesthesia Postprocedure Evaluation (Signed)
 Anesthesia Post Note  Patient: Sharon Shepherd  Procedure(s) Performed: UPPER ENDOSCOPIC ULTRASOUND (EUS) RADIAL     Patient location during evaluation: PACU Anesthesia Type: MAC Level of consciousness: awake and alert Pain management: pain level controlled Vital Signs Assessment: post-procedure vital signs reviewed and stable Respiratory status: spontaneous breathing, nonlabored ventilation, respiratory function stable and patient connected to nasal cannula oxygen Cardiovascular status: stable and blood pressure returned to baseline Postop Assessment: no apparent nausea or vomiting Anesthetic complications: no   No notable events documented.  Last Vitals:  Vitals:   10/23/23 0830 10/23/23 0840  BP: 96/64 115/68  Pulse: 74 80  Resp: 17 (!) 21  Temp:    SpO2: 100% 97%    Last Pain:  Vitals:   10/23/23 0840  TempSrc:   PainSc: 0-No pain                 Earl Lites P Nidya Bouyer

## 2023-10-23 NOTE — H&P (Signed)
 GASTROENTEROLOGY PROCEDURE H&P NOTE   Primary Care Physician: Karie Schwalbe, MD  HPI: Nupur Hohman is a 73 y.o. female who presents for EGD/EUS to followup surveillance of a GIST.  Past Medical History:  Diagnosis Date   Allergy    Anemia    Celiac disease    COVID 09/25/2020   Diverticulitis    GERD (gastroesophageal reflux disease)    GIST (gastrointestinal stroma tumor), malignant, colon (HCC)    Hyperlipidemia    Hypertension    Sleep disturbance    Thyroiditis, lymphocytic    surgery 2013   Vitamin B12 deficiency    unresponsive to oral replacement   Past Surgical History:  Procedure Laterality Date   COLONOSCOPY     ESOPHAGOGASTRODUODENOSCOPY  03/04   colon   ESOPHAGOGASTRODUODENOSCOPY N/A 02/15/2021   Procedure: ESOPHAGOGASTRODUODENOSCOPY (EGD);  Surgeon: Rachael Fee, MD;  Location: [facility];  Service: Endoscopy;  Laterality: N/A;   ESOPHAGOGASTRODUODENOSCOPY N/A 06/20/2022   Procedure: ESOPHAGOGASTRODUODENOSCOPY (EGD);  Surgeon: Lemar Lofty., MD;  Location: [facility];  Service: Gastroenterology;  Laterality: N/A;   EUS N/A 02/15/2021   Procedure: UPPER ENDOSCOPIC ULTRASOUND (EUS) RADIAL;  Surgeon: Rachael Fee, MD;  Location: [facility];  Service: Endoscopy;  Laterality: N/A;   EUS N/A 06/20/2022   Procedure: UPPER ENDOSCOPIC ULTRASOUND (EUS) RADIAL;  Surgeon: Lemar Lofty., MD;  Location: [facility];  Service: Gastroenterology;  Laterality: N/A;   FINE NEEDLE ASPIRATION N/A 02/15/2021   Procedure: FINE NEEDLE ASPIRATION (FNA) LINEAR;  Surgeon: Rachael Fee, MD;  Location: [facility];  Service: Endoscopy;  Laterality: N/A;   Laryngeal implant  2/13   after damage during thyroidectomy   THYROIDECTOMY  12/12   Lymphocytic thyroiditis with goiter--Dr Renda Rolls   Current Facility-Administered Medications  Medication Dose Route Frequency Provider Last Rate Last Admin   0.9 %  sodium chloride infusion    Intravenous Continuous Mansouraty, Netty Starring., MD       0.9 %  sodium chloride infusion    Continuous PRN Mansouraty, Netty Starring., MD 20 mL/hr at 10/23/23 0715 500 mL at 10/23/23 0715    Current Facility-Administered Medications:    0.9 %  sodium chloride infusion, , Intravenous, Continuous, Mansouraty, Netty Starring., MD   0.9 %  sodium chloride infusion, , , Continuous PRN, Mansouraty, Netty Starring., MD, Last Rate: 20 mL/hr at 10/23/23 0715, 500 mL at 10/23/23 0715 Allergies  Allergen Reactions   Codeine Itching   Family History  Problem Relation Age of Onset   Other Father        pancreatic absess   Heart attack Mother    Colon cancer Neg Hx    Breast cancer Neg Hx    Esophageal cancer Neg Hx    Stomach cancer Neg Hx    Rectal cancer Neg Hx    Social History   Socioeconomic History   Marital status: Married    Spouse name: Not on file   Number of children: 2   Years of education: Not on file   Highest education level: Not on file  Occupational History   Occupation: Primary school teacher: [employer]  Tobacco Use   Smoking status: Never    Passive exposure: Past (as a child)   Smokeless tobacco: Never  Vaping Use   Vaping status: Never Used  Substance and Sexual Activity   Alcohol use: Not Currently    Comment: rare   Drug use: No   Sexual activity: Not on  file  Other Topics Concern   Not on file  Social History Narrative   The patient is married and has 2 children.   She is employed in Firefighter at [employer]   Rare alcohol and never tobacco      No living will   Husband is health care POA--sons are alternates   Would accept resuscitation   Wouldn't want long term tube feeds   Social Drivers of Corporate investment banker Strain: Not on file  Food Insecurity: Not on file  Transportation Needs: Not on file  Physical Activity: Not on file  Stress: Not on file  Social Connections: Not on file  Intimate Partner Violence: Not on file    Physical  Exam: Today's Vitals   10/23/23 0714  BP: (!) 149/93  Pulse: 79  Resp: (!) 21  Temp: (!) 97.4 F (36.3 C)  TempSrc: Temporal  SpO2: 98%  Weight: 93 kg  Height: 5' 5.5" (1.664 m)  PainSc: 0-No pain   Body mass index is 33.59 kg/m. GEN: NAD EYE: Sclerae anicteric ENT: MMM CV: Non-tachycardic GI: Soft, NT/ND NEURO:  Alert & Oriented x 3  Lab Results: No results for input(s): "WBC", "HGB", "HCT", "PLT" in the last 72 hours. BMET No results for input(s): "NA", "K", "CL", "CO2", "GLUCOSE", "BUN", "CREATININE", "CALCIUM" in the last 72 hours. LFT No results for input(s): "PROT", "ALBUMIN", "AST", "ALT", "ALKPHOS", "BILITOT", "BILIDIR", "IBILI" in the last 72 hours. PT/INR No results for input(s): "LABPROT", "INR" in the last 72 hours.   Impression / Plan: This is a 73 y.o.female  who presents for EGD/EUS to followup surveillance of a GIST.  The risks of an EUS including intestinal perforation, bleeding, infection, aspiration, and medication effects were discussed as was the possibility it may not give a definitive diagnosis if a biopsy is performed.  When a biopsy of the pancreas is done as part of the EUS, there is an additional risk of pancreatitis at the rate of about 1-2%.  It was explained that procedure related pancreatitis is typically mild, although it can be severe and even life threatening, which is why we do not perform random pancreatic biopsies and only biopsy a lesion/area we feel is concerning enough to warrant the risk.   The risks and benefits of endoscopic evaluation/treatment were discussed with the patient and/or family; these include but are not limited to the risk of perforation, infection, bleeding, missed lesions, lack of diagnosis, severe illness requiring hospitalization, as well as anesthesia and sedation related illnesses.  The patient's history has been reviewed, patient examined, no change in status, and deemed stable for procedure.  The patient and/or  family is agreeable to proceed.    Corliss Parish, MD [facility] Office # [phone]

## 2023-10-23 NOTE — Transfer of Care (Signed)
 Immediate Anesthesia Transfer of Care Note  Patient: Sharon Shepherd  Procedure(s) Performed: UPPER ENDOSCOPIC ULTRASOUND (EUS) RADIAL  Patient Location: PACU  Anesthesia Type:MAC  Level of Consciousness: sedated  Airway & Oxygen Therapy: Patient Spontanous Breathing  Post-op Assessment: Report given to RN and Post -op Vital signs reviewed and stable  Post vital signs: Reviewed and stable  Last Vitals:  Vitals Value Taken Time  BP 99/64 10/23/23 0829  Temp 36.5 C 10/23/23 0829  Pulse 69 10/23/23 0831  Resp 16 10/23/23 0831  SpO2 100 % 10/23/23 0831  Vitals shown include unfiled device data.  Last Pain:  Vitals:   10/23/23 0829  TempSrc: Temporal  PainSc: Asleep         Complications: No notable events documented.

## 2023-10-23 NOTE — Telephone Encounter (Signed)
-----   Message from [facility] sent at 10/23/2023  8:45 AM EDT ----- Team, Overall stability in the gastric GIST. Will plan a 2 year EUS surveillance. Thanks. GM

## 2023-10-23 NOTE — Op Note (Addendum)
 [facility] Patient Name: Sharon Shepherd Procedure Date: 10/23/2023 MRN: [id number] Attending MD: Corliss Parish , MD, [id number] Date of Birth: 07/06/1951 CSN: [id number] Age: 73 Admit Type: Ambulatory Procedure:                Upper EUS Indications:              Gastrointestinal stromal tumor (GIST) of the stomach Providers:                Corliss Parish, MD, Margaree Mackintosh, RN,                            Marja Kays, Technician, Harrington Challenger,                            Technician Referring MD:              Medicines:                Monitored Anesthesia Care Complications:            No immediate complications. Estimated Blood Loss:     Estimated blood loss: none. Procedure:                Pre-Anesthesia Assessment:                           - Prior to the procedure, a History and Physical                            was performed, and patient medications and                            allergies were reviewed. The patient's tolerance of                            previous anesthesia was also reviewed. The risks                            and benefits of the procedure and the sedation                            options and risks were discussed with the patient.                            All questions were answered, and informed consent                            was obtained. Prior Anticoagulants: The patient has                            taken no anticoagulant or antiplatelet agents                            except for aspirin. ASA Grade Assessment: II - A  patient with mild systemic disease. After reviewing                            the risks and benefits, the patient was deemed in                            satisfactory condition to undergo the procedure.                           After obtaining informed consent, the endoscope was                            passed under direct vision. Throughout the                             procedure, the patient's blood pressure, pulse, and                            oxygen saturations were monitored continuously. The                            [id number]-[id number] ([id number]) Olympus endoscope was introduced                            through the mouth, and advanced to the second part                            of duodenum. The GF-[id number]-[id number] ([id number]) Olympus                            radial ultrasound scope was introduced through the                            mouth, and advanced to the stomach for ultrasound                            examination from the esophagus and stomach. The                            upper EUS was accomplished without difficulty. The                            patient tolerated the procedure. Scope In: Scope Out: Findings:      ENDOSCOPIC FINDING: :      No gross lesions were noted in the entire esophagus.      The distal esophagus was mildly tortuous.      The Z-line was irregular and was found 32 cm from the incisors.      A 3 cm hiatal hernia was present.      A single 15 mm subepithelial papule (nodule) was found on the lesser       curvature of the stomach.      No other gross lesions were noted in the entire examined stomach.      No gross lesions were noted  in the duodenal bulb, in the first portion       of the duodenum and in the second portion of the duodenum.      ENDOSONOGRAPHIC FINDING: :      A round intramural (subepithelial) lesion was found in the lesser curve       of the stomach. The lesion was hypoechoic, calcified and shadowing.       Sonographically, the lesion appeared to originate from the muscularis       propria (Layer 4). The lesion also measured 13.5 mm by 11.6 mm in       diameter. The outer endosonographic borders were well defined.      Endosonographic imaging in the rest of the cardia of the stomach, in the       fundus of the stomach, in the body of the stomach and in the antrum of       the stomach showed no  intramural (subepithelial) lesion.      Endosonographic imaging in the visualized portion of the liver showed no       mass.      No malignant-appearing lymph nodes were visualized in the left gastric       region (level 17), gastrohepatic ligament (level 18), celiac region       (level 20) and perigastric region.      The celiac region ganglia was visualized. Impression:               EGD impression:                           - No gross lesions in the entire esophagus.                            Tortuous distal esophagus. Z-line irregular, 32 cm                            from the incisors.                           - 3 cm hiatal hernia.                           - A single subepithelial papule (nodule) found in                            the lesser curve of the stomach.                           - No other gross lesions in the entire stomach.                           - No gross lesions in the duodenal bulb, in the                            first portion of the duodenum and in the second                            portion of the duodenum.  EUS impression:                           - An intramural (subepithelial) lesion was found in                            the lesser curve of the stomach. The lesion                            appeared to originate from within the muscularis                            propria (Layer 4). A tissue diagnosis was obtained                            prior to this exam a few years ago. This is                            consistent with a stromal cell (smooth muscle)                            neoplasm (GIST). It is unchanged in size from last                            EUS evaluation.                           - No malignant-appearing lymph nodes were                            visualized in the left gastric region (level 17),                            gastrohepatic ligament (level 18), celiac region                             (level 20) and perigastric region. Moderate Sedation:      Not Applicable - Patient had care per Anesthesia. Recommendation:           - The patient will be observed post-procedure,                            until all discharge criteria are met.                           - Discharge patient to home.                           - Patient has a contact number available for                            emergencies. The signs and symptoms of potential  delayed complications were discussed with the                            patient. Return to normal activities tomorrow.                            Written discharge instructions were provided to the                            patient.                           - Resume previous diet.                           - Observe patient's clinical course.                           - With overall stability of GIST over the last few                            years, I think it is reasonable to push her                            surveillance out to 2 years.                           - Follow-up with primary GI in the interim if other                            issues arise.                           - The findings and recommendations were discussed                            with the patient.                           - The findings and recommendations were discussed                            with the patient's family. Procedure Code(s):        --- Professional ---                           [phone], Esophagogastroduodenoscopy, flexible,                            transoral; with endoscopic ultrasound examination                            limited to the esophagus, stomach or duodenum, and                            adjacent structures Diagnosis Code(s):        ---  Professional ---                           Q39.9, Congenital malformation of esophagus,                            unspecified                           K22.89, Other  specified disease of esophagus                           K44.9, Diaphragmatic hernia without obstruction or                            gangrene                           K31.89, Other diseases of stomach and duodenum                           I89.9, Noninfective disorder of lymphatic vessels                            and lymph nodes, unspecified                           C49.A2, Gastrointestinal stromal tumor of stomach CPT copyright 2022 American Medical Association. All rights reserved. The codes documented in this report are preliminary and upon coder review may  be revised to meet current compliance requirements. Corliss Parish, MD 10/23/2023 8:38:55 AM Number of Addenda: 0

## 2023-10-23 NOTE — Telephone Encounter (Signed)
 Recall has been entered for 2 year EUS

## 2023-10-23 NOTE — Discharge Instructions (Signed)

## 2023-10-26 ENCOUNTER — Encounter (HOSPITAL_COMMUNITY): Payer: Self-pay | Admitting: Gastroenterology

## 2023-10-31 MED ORDER — AMOXICILLIN-POT CLAVULANATE 875-125 MG PO TABS: 1.0000 | ORAL_TABLET | Freq: Two times a day (BID) | ORAL | 0 refills | Status: DC

## 2023-10-31 NOTE — Telephone Encounter (Signed)
 Noted.

## 2023-10-31 NOTE — Telephone Encounter (Signed)
 The pt has complaints of lower abdominal pain that seems to be more generalized than to the left or right. Pressure to the abd causes more discomfort.  This pain/discomfort started yesterday but seems worse today.  She has a history of diverticulitis with last flare in 2023. She reports regular bowel movements on Linzess.  Last BM was today and it was loose and watery.  No bleeding or mucous but seems to be a "pale" color.  No nausea or vomiting.  She does not believe she has a fever.  She states Dr Leone Payor advised that if she developed diverticulitis again to call and he would prescribe abx.  Dr Leone Payor please advise

## 2023-10-31 NOTE — Telephone Encounter (Signed)
 Inbound call fro patient requesting to speak with a nurse in regards to her having diverticulitis. Please advise.   Thank you

## 2023-10-31 NOTE — Addendum Note (Signed)
 Addended by: Iva Boop on: 10/31/2023 01:18 PM   Modules accepted: Orders

## 2023-10-31 NOTE — Telephone Encounter (Signed)
 I called to clarify the water stool but she says Linzess does that.  Rx sent  Meds ordered this encounter  Medications   amoxicillin-clavulanate (AUGMENTIN) 875-125 MG tablet    Sig: Take 1 tablet by mouth 2 (two) times daily.    Dispense:  20 tablet    Refill:  0     Advised soft low-fiber diet and if fails to resolve/worsens call back or go to ED

## 2023-11-02 ENCOUNTER — Encounter (HOSPITAL_COMMUNITY): Payer: Self-pay | Admitting: Emergency Medicine

## 2023-11-02 ENCOUNTER — Inpatient Hospital Stay (HOSPITAL_COMMUNITY)
Admission: EM | Admit: 2023-11-02 | Discharge: 2023-11-04 | DRG: 392 | Disposition: A | Discharge status: Home / Self Care | Attending: Internal Medicine | Admitting: Internal Medicine

## 2023-11-02 ENCOUNTER — Other Ambulatory Visit: Payer: Self-pay

## 2023-11-02 ENCOUNTER — Encounter: Payer: Self-pay | Admitting: Oncology

## 2023-11-02 ENCOUNTER — Emergency Department (HOSPITAL_COMMUNITY)

## 2023-11-02 DIAGNOSIS — E890 Postprocedural hypothyroidism: Secondary | ICD-10-CM | POA: Diagnosis present

## 2023-11-02 DIAGNOSIS — I10 Essential (primary) hypertension: Secondary | ICD-10-CM | POA: Diagnosis present

## 2023-11-02 DIAGNOSIS — E785 Hyperlipidemia, unspecified: Secondary | ICD-10-CM | POA: Diagnosis present

## 2023-11-02 DIAGNOSIS — J45909 Unspecified asthma, uncomplicated: Secondary | ICD-10-CM | POA: Diagnosis present

## 2023-11-02 DIAGNOSIS — R110 Nausea: Secondary | ICD-10-CM

## 2023-11-02 DIAGNOSIS — Z7982 Long term (current) use of aspirin: Secondary | ICD-10-CM | POA: Diagnosis not present

## 2023-11-02 DIAGNOSIS — K900 Celiac disease: Secondary | ICD-10-CM | POA: Diagnosis present

## 2023-11-02 DIAGNOSIS — Z85831 Personal history of malignant neoplasm of soft tissue: Secondary | ICD-10-CM

## 2023-11-02 DIAGNOSIS — Z860100 Personal history of colon polyps, unspecified: Secondary | ICD-10-CM

## 2023-11-02 DIAGNOSIS — Z8249 Family history of ischemic heart disease and other diseases of the circulatory system: Secondary | ICD-10-CM | POA: Diagnosis not present

## 2023-11-02 DIAGNOSIS — E876 Hypokalemia: Secondary | ICD-10-CM | POA: Diagnosis present

## 2023-11-02 DIAGNOSIS — Z79899 Other long term (current) drug therapy: Secondary | ICD-10-CM

## 2023-11-02 DIAGNOSIS — Z8616 Personal history of COVID-19: Secondary | ICD-10-CM

## 2023-11-02 DIAGNOSIS — K5792 Diverticulitis of intestine, part unspecified, without perforation or abscess without bleeding: Principal | ICD-10-CM | POA: Diagnosis present

## 2023-11-02 DIAGNOSIS — Z79890 Hormone replacement therapy (postmenopausal): Secondary | ICD-10-CM | POA: Diagnosis not present

## 2023-11-02 DIAGNOSIS — K219 Gastro-esophageal reflux disease without esophagitis: Secondary | ICD-10-CM | POA: Diagnosis present

## 2023-11-02 DIAGNOSIS — Z885 Allergy status to narcotic agent status: Secondary | ICD-10-CM | POA: Diagnosis not present

## 2023-11-02 DIAGNOSIS — K5732 Diverticulitis of large intestine without perforation or abscess without bleeding: Principal | ICD-10-CM | POA: Diagnosis present

## 2023-11-02 DIAGNOSIS — C49A2 Gastrointestinal stromal tumor of stomach: Secondary | ICD-10-CM | POA: Diagnosis present

## 2023-11-02 DIAGNOSIS — R1032 Left lower quadrant pain: Secondary | ICD-10-CM

## 2023-11-02 DIAGNOSIS — E039 Hypothyroidism, unspecified: Secondary | ICD-10-CM | POA: Diagnosis present

## 2023-11-02 LAB — COMPREHENSIVE METABOLIC PANEL
ALT: 20 U/L (ref 0–44)
AST: 25 U/L (ref 15–41)
Albumin: 4 g/dL (ref 3.5–5.0)
Alkaline Phosphatase: 76 U/L (ref 38–126)
Anion gap: 10 (ref 5–15)
BUN: 17 mg/dL (ref 8–23)
CO2: 23 mmol/L (ref 22–32)
Calcium: 9.3 mg/dL (ref 8.9–10.3)
Chloride: 105 mmol/L (ref 98–111)
Creatinine, Ser: 0.78 mg/dL (ref 0.44–1.00)
GFR, Estimated: >60 mL/min (ref >60)
Glucose, Bld: 132 mg/dL — ABNORMAL HIGH (ref 70–99)
Potassium: 3.1 mmol/L — ABNORMAL LOW (ref 3.5–5.1)
Sodium: 138 mmol/L (ref 135–145)
Total Bilirubin: 1.7 mg/dL — ABNORMAL HIGH (ref 0.0–1.2)
Total Protein: 7.5 g/dL (ref 6.5–8.1)

## 2023-11-02 LAB — LIPASE, BLOOD: Lipase: 32 U/L (ref 11–51)

## 2023-11-02 LAB — URINALYSIS, ROUTINE W REFLEX MICROSCOPIC
Bacteria, UA: NONE SEEN
Bilirubin Urine: NEGATIVE
Glucose, UA: NEGATIVE mg/dL
Hgb urine dipstick: NEGATIVE
Ketones, ur: NEGATIVE mg/dL
Nitrite: NEGATIVE
Protein, ur: NEGATIVE mg/dL
Specific Gravity, Urine: 1.025 (ref 1.005–1.030)
pH: 5 (ref 5.0–8.0)

## 2023-11-02 LAB — CBC
HCT: 40.6 % (ref 36.0–46.0)
Hemoglobin: 13.3 g/dL (ref 12.0–15.0)
MCH: 27.3 pg (ref 26.0–34.0)
MCHC: 32.8 g/dL (ref 30.0–36.0)
MCV: 83.2 fL (ref 80.0–100.0)
Platelets: 195 10*3/uL (ref 150–400)
RBC: 4.88 MIL/uL (ref 3.87–5.11)
RDW: 13.9 % (ref 11.5–15.5)
WBC: 7.7 10*3/uL (ref 4.0–10.5)
nRBC: 0 % (ref 0.0–0.2)

## 2023-11-02 MED ORDER — PIPERACILLIN-TAZOBACTAM 3.375 G IVPB 30 MIN
3.3750 g | Freq: Once | INTRAVENOUS | Status: AC
  Administered 2023-11-02: 3.375 g via INTRAVENOUS
  Filled 2023-11-02: qty 50

## 2023-11-02 MED ORDER — MORPHINE SULFATE (PF) 2 MG/ML IV SOLN: 2.0000 mg | Freq: Once | INTRAVENOUS | Status: DC

## 2023-11-02 MED ORDER — IOHEXOL 300 MG/ML  SOLN
100.0000 mL | Freq: Once | INTRAMUSCULAR | Status: AC | PRN
  Administered 2023-11-02: 100 mL via INTRAVENOUS

## 2023-11-02 MED ORDER — ONDANSETRON HCL 4 MG/2ML IJ SOLN: 4.0000 mg | Freq: Once | INTRAMUSCULAR | Status: DC

## 2023-11-02 NOTE — ED Triage Notes (Signed)
 Pt reports being diagnosed w/ pancreatitis and taking antibiotics w/ no relief. Pt reports she pain has been 10/10 today and she also has had nausea.

## 2023-11-02 NOTE — H&P (Signed)
 History and Physical    Patient: Sharon Shepherd [facility] ZHY:[id number] DOB: June 22, 1951 DOA: 11/02/2023 DOS: the patient was seen and examined on 11/02/2023 PCP: Karie Schwalbe, MD  Patient coming from: Home  Chief Complaint:  Chief Complaint  Patient presents with   Abdominal Pain   HPI: Sharon Shepherd is a 73 y.o. 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  Patient has been on oral Augmentin for about 5 days but no relief.  Continues to have left lower quadrant abdominal pain with some nausea.  No hematemesis no melena.  No bright red blood per rectum.  Patient came to the ER where she was seen and evaluated.  She appears to acute diverticulitis that has failed outpatient treatment.  Patient is therefore being admitted to the hospital for IV antibiotic.  Review of Systems: As mentioned in the history of present illness. All other systems reviewed and are negative. Past Medical History:  Diagnosis Date   Allergy    Anemia    Celiac disease    COVID 09/25/2020   Diverticulitis    GERD (gastroesophageal reflux disease)    GIST (gastrointestinal stroma tumor), malignant, colon (HCC)    Hyperlipidemia    Hypertension    Sleep disturbance    Thyroiditis, lymphocytic    surgery 2013   Vitamin B12 deficiency    unresponsive to oral replacement   Past Surgical History:  Procedure Laterality Date   COLONOSCOPY     ESOPHAGOGASTRODUODENOSCOPY  03/04   colon   ESOPHAGOGASTRODUODENOSCOPY N/A 02/15/2021   Procedure: ESOPHAGOGASTRODUODENOSCOPY (EGD);  Surgeon: Rachael Fee, MD;  Location: [facility];  Service: Endoscopy;  Laterality: N/A;   ESOPHAGOGASTRODUODENOSCOPY N/A 06/20/2022   Procedure: ESOPHAGOGASTRODUODENOSCOPY (EGD);  Surgeon: Lemar Lofty., MD;   Location: [facility];  Service: Gastroenterology;  Laterality: N/A;   EUS N/A 02/15/2021   Procedure: UPPER ENDOSCOPIC ULTRASOUND (EUS) RADIAL;  Surgeon: Rachael Fee, MD;  Location: [facility];  Service: Endoscopy;  Laterality: N/A;   EUS N/A 06/20/2022   Procedure: UPPER ENDOSCOPIC ULTRASOUND (EUS) RADIAL;  Surgeon: Lemar Lofty., MD;  Location: [facility];  Service: Gastroenterology;  Laterality: N/A;   EUS N/A 10/23/2023   Procedure: UPPER ENDOSCOPIC ULTRASOUND (EUS) RADIAL;  Surgeon: Lemar Lofty., MD;  Location: [facility];  Service: Gastroenterology;  Laterality: N/A;   FINE NEEDLE ASPIRATION N/A 02/15/2021   Procedure: FINE NEEDLE ASPIRATION (FNA) LINEAR;  Surgeon: Rachael Fee, MD;  Location: [facility];  Service: Endoscopy;  Laterality: N/A;   Laryngeal implant  2/13   after damage during thyroidectomy   THYROIDECTOMY  12/12   Lymphocytic thyroiditis with goiter--Dr Renda Rolls   Social History:  reports that she has never smoked. She has been exposed to tobacco smoke. She has never used smokeless tobacco. She reports that she does not currently use alcohol. She reports that she does not use drugs.  Allergies  Allergen Reactions   Codeine Itching    Family History  Problem Relation Age of Onset   Other Father        pancreatic absess   Heart attack Mother    Colon cancer Neg Hx    Breast cancer Neg Hx    Esophageal cancer Neg Hx    Stomach cancer Neg Hx    Rectal cancer Neg Hx     Prior to Admission medications  Medication Sig Start Date End Date Taking? Authorizing Provider  albuterol (VENTOLIN HFA) 108 (90 Base) MCG/ACT inhaler Inhale 2 puffs into the lungs every 6 (six) hours as needed for wheezing or shortness of breath. 08/14/22   Viviano Simas, FNP  amoxicillin-clavulanate (AUGMENTIN) 875-125 MG tablet Take 1 tablet by mouth 2 (two) times daily. 10/31/23   Iva Boop, MD  aspirin 81 MG chewable tablet Chew 81 mg by mouth every  other day. In the evening    [provider]  atorvastatin (LIPITOR) 20 MG tablet TAKE 1 TABLET BY MOUTH ONCE  DAILY 01/27/23   Tillman Abide I, MD  cetirizine (ZYRTEC) 10 MG tablet TAKE 1 TABLET BY MOUTH DAILY. 11/25/22   Karie Schwalbe, MD  Cholecalciferol (VITAMIN D3) 5000 UNITS CAPS Take 5,000 Units by mouth at bedtime.    [provider]  cyanocobalamin (VITAMIN B12) 1000 MCG/ML injection Inject 1 mL (1,000 mcg total) into the muscle every 30 (thirty) days. 02/05/23   Karie Schwalbe, MD  diazepam (VALIUM) 5 MG tablet Take 1 tablet (5 mg total) by mouth 3 (three) times daily as needed. For severe vertigo 12/25/18   Karie Schwalbe, MD  levothyroxine (SYNTHROID) 50 MCG tablet TAKE 1 TABLET BY MOUTH DAILY 11/04/22   Karie Schwalbe, MD  LINZESS 290 MCG CAPS capsule TAKE ONE CAPSULE BY MOUTH EVERY OTHER DAY IN THE MORNING 08/22/23   Karie Schwalbe, MD  meclizine (ANTIVERT) 25 MG tablet Take 1-2 tablets (25-50 mg total) by mouth 3 (three) times daily as needed. For vertigo Patient taking differently: Take 25-50 mg by mouth 3 (three) times daily as needed (dizziness/vertigo). 10/02/20   Karie Schwalbe, MD  Syringe/Needle, Disp, 25G X 1-1/2" 1 ML MISC 1 each by Does not apply route every 30 (thirty) days. 02/05/23   Karie Schwalbe, MD  valsartan-hydrochlorothiazide (DIOVAN-HCT) 320-12.5 MG tablet TAKE 1 TABLET BY MOUTH DAILY Patient not taking: Reported on 10/23/2023 06/24/23   Karie Schwalbe, MD    Physical Exam: Vitals:   11/02/23 2054  BP: 127/77  Pulse: (!) 112  Resp: 17  Temp: 98.1 F (36.7 C)  TempSrc: Oral  SpO2: 96%   Constitutional: Acute on chronic illness: NAD, calm, comfortable Eyes: PERRL, lids and conjunctivae normal ENMT: Mucous membranes are moist. Posterior pharynx clear of any exudate or lesions.Normal dentition.  Neck: normal, supple, no masses, no thyromegaly Respiratory: clear to auscultation bilaterally, no wheezing, no crackles.  Normal respiratory effort. No accessory muscle use.  Cardiovascular: Sinus tachycardia, no murmurs / rubs / gallops. No extremity edema. 2+ pedal pulses. No carotid bruits.  Abdomen: Left lower quadrant tenderness, no masses palpated. No hepatosplenomegaly. Bowel sounds positive.  Musculoskeletal: Good range of motion, no joint swelling or tenderness, Skin: no rashes, lesions, ulcers. No induration Neurologic: CN 2-12 grossly intact. Sensation intact, DTR normal. Strength 5/5 in all 4.  Psychiatric: Normal judgment and insight. Alert and oriented x 3. Normal mood  Data Reviewed:  Temperature 98, pulse of 112, potassium 3.1 glucose 132 total bili 1.7 CT abdomen and pelvis showed sigmoid colonic diverticulosis with pericolonic inflammatory changes consistent with acute diverticulitis.  Small amount of free fluid in the pelvis which is reactive  Assessment and Plan:  #1 acute sigmoid diverticulitis: Patient has failed outpatient oral antibiotics.  Will admit the patient.  IV antibiotics.  She is on IV Zosyn will continue.  Clear liquid diet.  Supportive care.  Pain control and nausea control.  Patient may  require new colonoscopy down the road.  #2 essential hypertension: Will resume home regimen as tolerated.  #3 history of asthma: Continue home medications.  #4 hyperlipidemia: Continue home statin.  #5 hypothyroidism: Continue home levothyroxine  #6 history of GIST tumor of the stomach: Patient following up with GI for pleuritic monitoring.  Will defer to GI  #7 history of celiac disease: Diet consideration.  Appears to be in remission.    Advance Care Planning:   Code Status: Full Code   Consults: None  Family Communication: No family at bedside  Severity of Illness: The appropriate patient status for this patient is INPATIENT. Inpatient status is judged to be reasonable and necessary in order to provide the required intensity of service to ensure the patient's safety. The  patient's presenting symptoms, physical exam findings, and initial radiographic and laboratory data in the context of their chronic comorbidities is felt to place them at high risk for further clinical deterioration. Furthermore, it is not anticipated that the patient will be medically stable for discharge from the hospital within 2 midnights of admission.   * I certify that at the point of admission it is my clinical judgment that the patient will require inpatient hospital care spanning beyond 2 midnights from the point of admission due to high intensity of service, high risk for further deterioration and high frequency of surveillance required.*  AuthorLonia Blood, MD 11/02/2023 11:28 PM  For on call review [email or web address].

## 2023-11-02 NOTE — ED Provider Notes (Signed)
 [place] EMERGENCY DEPARTMENT AT [facility] Provider Note   CSN: [id number] Arrival date & time: 11/02/23  2045     History  Chief Complaint  Patient presents with   Abdominal Pain    Sharon Shepherd is a 73 y.o. female with past medical history of HTN, celiac disease, hypothyroidism, HLD, IDA, chronic constipation, diverticulosis, diverticulitis presents emergency department for evaluation of left lower abdominal pain that started 2 days ago.  She reports that this feels similar to when she had diverticulitis in 10/01/2021. She reports that she reached out to her GI doctor Dr. Leone Payor at [facility] who prescribed Augmentin for suspected diverticulosis.  She has had 5 doses of antibiotics at this point.  She endorses some associated nausea without vomiting.  Last BM 2 days ago. She denies fevers  HPI     Home Medications Prior to Admission medications   Medication Sig Start Date End Date Taking? Authorizing Provider  albuterol (VENTOLIN HFA) 108 (90 Base) MCG/ACT inhaler Inhale 2 puffs into the lungs every 6 (six) hours as needed for wheezing or shortness of breath. 08/14/22   Viviano Simas, FNP  amoxicillin-clavulanate (AUGMENTIN) 875-125 MG tablet Take 1 tablet by mouth 2 (two) times daily. 10/31/23   Iva Boop, MD  aspirin 81 MG chewable tablet Chew 81 mg by mouth every other day. In the evening    [provider]  atorvastatin (LIPITOR) 20 MG tablet TAKE 1 TABLET BY MOUTH ONCE  DAILY 01/27/23   Tillman Abide I, MD  cetirizine (ZYRTEC) 10 MG tablet TAKE 1 TABLET BY MOUTH DAILY. 11/25/22   Karie Schwalbe, MD  Cholecalciferol (VITAMIN D3) 5000 UNITS CAPS Take 5,000 Units by mouth at bedtime.    [provider]  cyanocobalamin (VITAMIN B12) 1000 MCG/ML injection Inject 1 mL (1,000 mcg total) into the muscle every 30 (thirty) days. 02/05/23   Karie Schwalbe, MD  diazepam (VALIUM) 5 MG tablet Take 1 tablet (5 mg total) by mouth 3 (three) times  daily as needed. For severe vertigo 12/25/18   Karie Schwalbe, MD  levothyroxine (SYNTHROID) 50 MCG tablet TAKE 1 TABLET BY MOUTH DAILY 11/04/22   Karie Schwalbe, MD  LINZESS 290 MCG CAPS capsule TAKE ONE CAPSULE BY MOUTH EVERY OTHER DAY IN THE MORNING 08/22/23   Karie Schwalbe, MD  meclizine (ANTIVERT) 25 MG tablet Take 1-2 tablets (25-50 mg total) by mouth 3 (three) times daily as needed. For vertigo Patient taking differently: Take 25-50 mg by mouth 3 (three) times daily as needed (dizziness/vertigo). 10/02/20   Karie Schwalbe, MD  Syringe/Needle, Disp, 25G X 1-1/2" 1 ML MISC 1 each by Does not apply route every 30 (thirty) days. 02/05/23   Karie Schwalbe, MD  valsartan-hydrochlorothiazide (DIOVAN-HCT) 320-12.5 MG tablet TAKE 1 TABLET BY MOUTH DAILY Patient not taking: Reported on 10/23/2023 06/24/23   Karie Schwalbe, MD      Allergies    Codeine    Review of Systems   Review of Systems  Constitutional:  Negative for chills, fatigue and fever.  Respiratory:  Negative for cough, chest tightness, shortness of breath and wheezing.   Cardiovascular:  Negative for chest pain and palpitations.  Gastrointestinal:  Positive for abdominal pain. Negative for constipation, diarrhea, nausea and vomiting.  Neurological:  Negative for dizziness, seizures, weakness, light-headedness, numbness and headaches.    Physical Exam Updated Vital Signs BP 127/77 (BP Location: Left Arm)   Pulse (!) 112   Temp 98.1  F (36.7 C) (Oral)   Resp 17   SpO2 96%  Physical Exam Vitals and nursing note reviewed.  Constitutional:      General: She is not in acute distress.    Appearance: Normal appearance. She is not ill-appearing.  HENT:     Head: Normocephalic and atraumatic.  Eyes:     Conjunctiva/sclera: Conjunctivae normal.  Cardiovascular:     Rate and Rhythm: Tachycardia present.     Comments: 112bpm Pulmonary:     Effort: Pulmonary effort is normal. No respiratory distress.     Breath  sounds: Normal breath sounds.  Abdominal:     General: Bowel sounds are normal.     Palpations: Abdomen is soft.     Tenderness: There is abdominal tenderness in the suprapubic area and left lower quadrant. There is guarding. There is no right CVA tenderness or left CVA tenderness.  Musculoskeletal:     Right lower leg: No edema.     Left lower leg: No edema.  Skin:    General: Skin is warm.     Capillary Refill: Capillary refill takes less than 2 seconds.     Coloration: Skin is not jaundiced or pale.  Neurological:     Mental Status: She is alert and oriented to person, place, and time. Mental status is at baseline.     ED Results / Procedures / Treatments   Labs (all labs ordered are listed, but only abnormal results are displayed) Labs Reviewed  COMPREHENSIVE METABOLIC PANEL - Abnormal; Notable for the following components:      Result Value   Potassium 3.1 (*)    Glucose, Bld 132 (*)    Total Bilirubin 1.7 (*)    All other components within normal limits  URINALYSIS, ROUTINE W REFLEX MICROSCOPIC - Abnormal; Notable for the following components:   APPearance HAZY (*)    Leukocytes,Ua TRACE (*)    All other components within normal limits  LIPASE, BLOOD  CBC    EKG None  Radiology CT ABDOMEN PELVIS W CONTRAST Result Date: 11/02/2023 CLINICAL DATA:  Left lower quadrant abdominal pain EXAM: CT ABDOMEN AND PELVIS WITH CONTRAST TECHNIQUE: Multidetector CT imaging of the abdomen and pelvis was performed using the standard protocol following bolus administration of intravenous contrast. RADIATION DOSE REDUCTION: This exam was performed according to the departmental dose-optimization program which includes automated exposure control, adjustment of the mA and/or kV according to patient size and/or use of iterative reconstruction technique. CONTRAST:  OMNIPAQUE IOHEXOL 300 MG/ML  SOLN COMPARISON:  09/21/2021 FINDINGS: Lower chest: Patchy infiltrates in the lung bases, greater  on the left. This may represent atelectasis or pneumonia. Moderate-sized esophageal hiatal hernia. Hepatobiliary: No focal liver abnormality is seen. No gallstones, gallbladder wall thickening, or biliary dilatation. Pancreas: Unremarkable. No pancreatic ductal dilatation or surrounding inflammatory changes. Spleen: Normal in size without focal abnormality. Adrenals/Urinary Tract: Adrenal glands are unremarkable. Kidneys are normal, without renal calculi, focal lesion, or hydronephrosis. Bladder is unremarkable. Stomach/Bowel: Stomach, small bowel, and colon are not abnormally distended. Stool throughout the colon. Sigmoid colonic diverticulosis. Edema and stranding in the pericolonic fat around the sigmoid colon. Changes are consistent with acute diverticulitis. No loculated fluid collections. Small amount of free fluid in the low pelvis is likely reactive. Vascular/Lymphatic: Aortic atherosclerosis. No enlarged abdominal or pelvic lymph nodes. Reproductive: Uterus and bilateral adnexa are unremarkable. Other: No free air in the abdomen. Abdominal wall musculature appears intact. Musculoskeletal: No acute or significant osseous findings. IMPRESSION: 1. Sigmoid colonic  diverticulosis with pericolonic inflammatory changes and edema consistent with acute diverticulitis. No abscess. 2. Small amount of free fluid in the pelvis is likely reactive. 3. Moderate-sized esophageal hiatal hernia. 4. Atelectasis or infiltration in the lung bases. 5. Aortic atherosclerosis. Electronically Signed   By: Burman Nieves M.D.   On: 11/02/2023 22:38    Procedures Procedures    Medications Ordered in ED Medications  piperacillin-tazobactam (ZOSYN) IVPB 3.375 g (3.375 g Intravenous New Bag/Given 11/02/23 2325)  morphine (PF) 2 MG/ML injection 2 mg (has no administration in time range)  ondansetron (ZOFRAN) injection 4 mg (has no administration in time range)  iohexol (OMNIPAQUE) 300 MG/ML solution 100 mL (100 mLs  Intravenous Contrast Given 11/02/23 2223)    ED Course/ Medical Decision Making/ A&P                                 Medical Decision Making Amount and/or Complexity of Data Reviewed Labs: ordered. Radiology: ordered.  Risk Prescription drug management. Decision regarding hospitalization.   Patient presents to the ED for concern of LLQ pain, this involves an extensive number of treatment options, and is a complaint that carries with it a high risk of complications and morbidity.  The differential diagnosis includes appendicitis, diverticulosis, obstruction, perforation, colitis.  Deafly not exhaustive list   Co morbidities that complicate the patient evaluation  Past medical history of diverticulosis and diverticulitis   Additional history obtained:  Additional history obtained from  Nursing and Outside Medical Records   External records from outside source obtained and reviewed including  Triage RN note GI office visit on 08/21/23 ED visit from 09/21/21 for diverticulitis   Lab Tests:  I Ordered, and personally interpreted labs.  The pertinent results include:   K+ 3.1 CBG 132    Imaging Studies ordered:  I ordered imaging studies including CT abd pelvis w contrast  I independently visualized and interpreted imaging which showed sigmoid colonic diverticulitis without abscess, small amount of free fluid in pelvis likely reactive I agree with the radiologist interpretation     Medicines ordered and prescription drug management:  I ordered medication including zosyn, zofran, morphine  for diverticulitis Reevaluation of the patient after these medicines showed that the patient improved I have reviewed the patients home medicines and have made adjustments as needed     Consultations Obtained:  I requested consultation with the hospitalist (Dr. Mikeal Hawthorne),  and discussed lab and imaging findings as well as pertinent plan -accepts patient for admission for failed  outpatient treatment of diverticulitis    Problem List / ED Course:  Diverticulitis Pain and nausea persist despite 5 doses of Augmentin. VS with mild tachycardia. No fever nor hypotension She is significantly tender in LLQ and suprapubic regions.   CT supports diverticulitis.  At this time, I feel the patient would benefit from admission for failed outpatient management Started zosyn in ED    Reevaluation:  After the interventions noted above, I reevaluated the patient and found that they have :improved    Dispostion:  After consideration of the diagnostic results and the patients response to treatment, I feel that the patent would benefit from admission.    Final Clinical Impression(s) / ED Diagnoses Final diagnoses:  Diverticulitis  Left lower quadrant abdominal pain  Nausea    Rx / DC Orders ED Discharge Orders     None         Judithann Sheen,  PA 11/02/23 4401    Glyn Ade, MD 11/06/23 1455

## 2023-11-03 ENCOUNTER — Encounter: Payer: Self-pay | Admitting: Oncology

## 2023-11-03 DIAGNOSIS — K5792 Diverticulitis of intestine, part unspecified, without perforation or abscess without bleeding: Secondary | ICD-10-CM | POA: Diagnosis not present

## 2023-11-03 LAB — COMPREHENSIVE METABOLIC PANEL
ALT: 19 U/L (ref 0–44)
AST: 23 U/L (ref 15–41)
Albumin: 3.7 g/dL (ref 3.5–5.0)
Alkaline Phosphatase: 72 U/L (ref 38–126)
Anion gap: 8 (ref 5–15)
BUN: 15 mg/dL (ref 8–23)
CO2: 25 mmol/L (ref 22–32)
Calcium: 9.1 mg/dL (ref 8.9–10.3)
Chloride: 105 mmol/L (ref 98–111)
Creatinine, Ser: 0.8 mg/dL (ref 0.44–1.00)
GFR, Estimated: >60 mL/min (ref >60)
Glucose, Bld: 106 mg/dL — ABNORMAL HIGH (ref 70–99)
Potassium: 3.4 mmol/L — ABNORMAL LOW (ref 3.5–5.1)
Sodium: 138 mmol/L (ref 135–145)
Total Bilirubin: 1.5 mg/dL — ABNORMAL HIGH (ref 0.0–1.2)
Total Protein: 6.9 g/dL (ref 6.5–8.1)

## 2023-11-03 LAB — CBC
HCT: 40.3 % (ref 36.0–46.0)
Hemoglobin: 12.8 g/dL (ref 12.0–15.0)
MCH: 27.2 pg (ref 26.0–34.0)
MCHC: 31.8 g/dL (ref 30.0–36.0)
MCV: 85.7 fL (ref 80.0–100.0)
Platelets: 182 10*3/uL (ref 150–400)
RBC: 4.7 MIL/uL (ref 3.87–5.11)
RDW: 13.8 % (ref 11.5–15.5)
WBC: 5.3 10*3/uL (ref 4.0–10.5)
nRBC: 0 % (ref 0.0–0.2)

## 2023-11-03 LAB — HIV ANTIBODY (ROUTINE TESTING W REFLEX): HIV Screen 4th Generation wRfx: NONREACTIVE

## 2023-11-03 MED ORDER — ATORVASTATIN CALCIUM 20 MG PO TABS
20.0000 mg | ORAL_TABLET | Freq: Every day | ORAL | Status: DC
  Administered 2023-11-03 – 2023-11-04 (×2): 20 mg via ORAL
  Filled 2023-11-03 (×2): qty 1

## 2023-11-03 MED ORDER — GUAIFENESIN 100 MG/5ML PO LIQD: 5.0000 mL | ORAL | Status: DC | PRN

## 2023-11-03 MED ORDER — SODIUM CHLORIDE 0.9 % IV SOLN
2.0000 g | INTRAVENOUS | Status: DC
  Administered 2023-11-03: 2 g via INTRAVENOUS
  Filled 2023-11-03: qty 20

## 2023-11-03 MED ORDER — ONDANSETRON HCL 4 MG/2ML IJ SOLN
4.0000 mg | Freq: Four times a day (QID) | INTRAMUSCULAR | Status: DC | PRN
Start: 2023-11-03 — End: 2023-11-04
  Administered 2023-11-03: 4 mg via INTRAVENOUS

## 2023-11-03 MED ORDER — METOPROLOL TARTRATE 5 MG/5ML IV SOLN: 5.0000 mg | Freq: Four times a day (QID) | INTRAVENOUS | Status: DC | PRN

## 2023-11-03 MED ORDER — SENNOSIDES-DOCUSATE SODIUM 8.6-50 MG PO TABS: 1.0000 | ORAL_TABLET | Freq: Every evening | ORAL | Status: DC | PRN

## 2023-11-03 MED ORDER — METRONIDAZOLE 500 MG/100ML IV SOLN
500.0000 mg | Freq: Two times a day (BID) | INTRAVENOUS | Status: DC
  Administered 2023-11-03 – 2023-11-04 (×3): 500 mg via INTRAVENOUS
  Filled 2023-11-03 (×3): qty 100

## 2023-11-03 MED ORDER — LINACLOTIDE 145 MCG PO CAPS: 290.0000 ug | ORAL_CAPSULE | Freq: Every day | ORAL | Status: DC | PRN

## 2023-11-03 MED ORDER — ACETAMINOPHEN 325 MG PO TABS
650.0000 mg | ORAL_TABLET | Freq: Four times a day (QID) | ORAL | Status: DC | PRN
  Administered 2023-11-03: 650 mg via ORAL
  Filled 2023-11-03: qty 2

## 2023-11-03 MED ORDER — VALSARTAN-HYDROCHLOROTHIAZIDE 320-12.5 MG PO TABS: 1.0000 | ORAL_TABLET | Freq: Every day | ORAL | Status: DC

## 2023-11-03 MED ORDER — ONDANSETRON HCL 4 MG PO TABS: 4.0000 mg | ORAL_TABLET | Freq: Four times a day (QID) | ORAL | Status: DC | PRN

## 2023-11-03 MED ORDER — HYDROCHLOROTHIAZIDE 12.5 MG PO TABS
12.5000 mg | ORAL_TABLET | Freq: Every day | ORAL | Status: DC
  Administered 2023-11-03 – 2023-11-04 (×2): 12.5 mg via ORAL
  Filled 2023-11-03 (×2): qty 1

## 2023-11-03 MED ORDER — IRBESARTAN 150 MG PO TABS
300.0000 mg | ORAL_TABLET | Freq: Every day | ORAL | Status: DC
  Administered 2023-11-03 – 2023-11-04 (×2): 300 mg via ORAL
  Filled 2023-11-03 (×2): qty 2

## 2023-11-03 MED ORDER — ACETAMINOPHEN 325 MG PO TABS
650.0000 mg | ORAL_TABLET | Freq: Once | ORAL | Status: AC
  Administered 2023-11-03: 650 mg via ORAL
  Filled 2023-11-03: qty 2

## 2023-11-03 MED ORDER — KCL-LACTATED RINGERS-D5W 20 MEQ/L IV SOLN
INTRAVENOUS | Status: DC
  Filled 2023-11-03: qty 1000

## 2023-11-03 MED ORDER — DEXTROSE-SODIUM CHLORIDE 5-0.45 % IV SOLN: INTRAVENOUS | Status: AC

## 2023-11-03 MED ORDER — METOPROLOL TARTRATE 5 MG/5ML IV SOLN: 5.0000 mg | INTRAVENOUS | Status: DC | PRN

## 2023-11-03 MED ORDER — TRAZODONE HCL 50 MG PO TABS: 50.0000 mg | ORAL_TABLET | Freq: Every evening | ORAL | Status: DC | PRN

## 2023-11-03 MED ORDER — HYDRALAZINE HCL 20 MG/ML IJ SOLN: 10.0000 mg | INTRAMUSCULAR | Status: DC | PRN

## 2023-11-03 MED ORDER — IPRATROPIUM-ALBUTEROL 0.5-2.5 (3) MG/3ML IN SOLN: 3.0000 mL | RESPIRATORY_TRACT | Status: DC | PRN

## 2023-11-03 MED ORDER — PIPERACILLIN-TAZOBACTAM 3.375 G IVPB
3.3750 g | Freq: Three times a day (TID) | INTRAVENOUS | Status: DC
  Administered 2023-11-03: 3.375 g via INTRAVENOUS
  Filled 2023-11-03: qty 50

## 2023-11-03 MED ORDER — HYDROMORPHONE HCL 1 MG/ML IJ SOLN
0.5000 mg | INTRAMUSCULAR | Status: DC | PRN
  Administered 2023-11-03: 0.5 mg via INTRAVENOUS
  Filled 2023-11-03: qty 0.5

## 2023-11-03 MED ORDER — POTASSIUM CHLORIDE CRYS ER 20 MEQ PO TBCR
40.0000 meq | EXTENDED_RELEASE_TABLET | Freq: Once | ORAL | Status: AC
  Administered 2023-11-03: 40 meq via ORAL
  Filled 2023-11-03: qty 2

## 2023-11-03 MED ORDER — LEVOTHYROXINE SODIUM 50 MCG PO TABS
50.0000 ug | ORAL_TABLET | Freq: Every day | ORAL | Status: DC
  Administered 2023-11-03 – 2023-11-04 (×2): 50 ug via ORAL
  Filled 2023-11-03 (×2): qty 1

## 2023-11-03 NOTE — Progress Notes (Signed)
 PROGRESS NOTE    Sharon Shepherd  BMW:[id number] DOB: Dec 24, 1950 DOA: 11/02/2023 PCP: Karie Schwalbe, MD    Brief Narrative:   73 year old with history of diverticular disease, celiac disease, HTN, GIST tumor, hypothyroidism, hyperlipidemia admitted for acute diverticulitis.  She failed outpatient treatment with 5 days of p.o. Augmentin.  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  Currently on IV Zosyn, will de-escalate. Will need repeat C scope in 8 weeks.  - Supportive care, antiemetics.  [facility] colonoscopy 2022-severe diverticulosis, polyps removed  Hypokalemia - As needed repletion  Essential hypertension - IV as needed  Asthma - Bronchodilators  HLD - Statin  Hypothyroidism - Synthroid  GIST tumor History of celiac's disease -Follows outpatient GI  DVT prophylaxis: SCDs Start: 11/03/23 0013    Code Status: Full Code Family Communication:   Status is: Inpatient Remains inpatient appropriate because: Continue hospital stay for IV antibiotics.    Subjective: Still abdominal pain with slight improvement since yesterday.   Examination:  General exam: Appears calm and comfortable  Respiratory system: Clear to auscultation. Respiratory effort normal. Cardiovascular system: S1 & S2 heard, RRR. No JVD, murmurs, rubs, gallops or clicks. No pedal edema. Gastrointestinal system: Abdomen tender to deep palpation Central nervous system: Alert and oriented. No focal neurological deficits. Extremities: Symmetric 5 x 5 power. Skin: No rashes, lesions or ulcers Psychiatry: Judgement and insight appear normal. Mood & affect appropriate.                Diet  Orders (From admission, onward)     Start     Ordered   11/03/23 0013  Diet clear liquid Room service appropriate? Yes; Fluid consistency: Thin  Diet effective now       Question Answer Comment  Room service appropriate? Yes   Fluid consistency: Thin      11/03/23 0013            Objective: Vitals:   11/02/23 2054 11/03/23 0047 11/03/23 0102 11/03/23 0530  BP: 127/77 129/82  104/71  Pulse: (!) 112 88  72  Resp: 17 16  15   Temp: 98.1 F (36.7 C) 97.8 F (36.6 C)  98.5 F (36.9 C)  TempSrc: Oral Oral  Oral  SpO2: 96% 99%  97%  Weight:   93 kg   Height:   5' 5.5" (1.664 m)     Intake/Output Summary (Last 24 hours) at 11/03/2023 1122 Last data filed at 11/03/2023 0600 Gross per 24 hour  Intake 854.78 ml  Output 200 ml  Net 654.78 ml   Filed Weights   11/03/23 0102  Weight: 93 kg    Scheduled Meds:  atorvastatin  20 mg Oral Daily   hydrochlorothiazide  12.5 mg Oral Daily   irbesartan  300 mg Oral Daily   levothyroxine  50 mcg Oral Daily    morphine injection  2 mg Intravenous Once   ondansetron (ZOFRAN) IV  4 mg Intravenous Once   Continuous Infusions:  cefTRIAXone (ROCEPHIN)  IV     dextrose 5 % and 0.45 % NaCl 75 mL/hr at 11/03/23 1008   metronidazole      Nutritional status     Body mass index is 33.59 kg/m.  Data Reviewed:  CBC: Recent Labs  Lab 11/02/23 2114 11/03/23 0432  WBC 7.7 5.3  HGB 13.3 12.8  HCT 40.6 40.3  MCV 83.2 85.7  PLT 195 182   Basic Metabolic Panel: Recent Labs  Lab 11/02/23 2114 11/03/23 0432  NA 138 138  K 3.1* 3.4*  CL 105 105  CO2 23 25  GLUCOSE 132* 106*  BUN 17 15  CREATININE 0.78 0.80  CALCIUM 9.3 9.1   GFR: Estimated Creatinine Clearance: 74.5 mL/min (by C-G formula based on SCr of 0.8 mg/dL). Liver Function Tests: Recent Labs  Lab 11/02/23 2114 11/03/23 0432  AST 25 23  ALT 20 19  ALKPHOS 76 72  BILITOT 1.7* 1.5*  PROT 7.5 6.9  ALBUMIN 4.0 3.7   Recent Labs  Lab 11/02/23 2114  LIPASE  32   No results for input(s): "AMMONIA" in the last 168 hours. Coagulation Profile: No results for input(s): "INR", "PROTIME" in the last 168 hours. Cardiac Enzymes: No results for input(s): "CKTOTAL", "CKMB", "CKMBINDEX", "TROPONINI" in the last 168 hours. BNP (last 3 results) No results for input(s): "PROBNP" in the last 8760 hours. HbA1C: No results for input(s): "HGBA1C" in the last 72 hours. CBG: No results for input(s): "GLUCAP" in the last 168 hours. Lipid Profile: No results for input(s): "CHOL", "HDL", "LDLCALC", "TRIG", "CHOLHDL", "LDLDIRECT" in the last 72 hours. Thyroid Function Tests: No results for input(s): "TSH", "T4TOTAL", "FREET4", "T3FREE", "THYROIDAB" in the last 72 hours. Anemia Panel: No results for input(s): "VITAMINB12", "FOLATE", "FERRITIN", "TIBC", "IRON", "RETICCTPCT" in the last 72 hours. Sepsis Labs: No results for input(s): "PROCALCITON", "LATICACIDVEN" in the last 168 hours.  No results found for this or any previous visit (from the past 240 hours).       Radiology Studies: CT ABDOMEN PELVIS W CONTRAST Result Date: 11/02/2023 CLINICAL DATA:  Left lower quadrant abdominal pain EXAM: CT ABDOMEN AND PELVIS WITH CONTRAST TECHNIQUE: Multidetector CT imaging of the abdomen and pelvis was performed using the standard protocol following bolus administration of intravenous contrast. RADIATION DOSE REDUCTION: This exam was performed according to the departmental dose-optimization program which includes automated exposure control, adjustment of the mA and/or kV according to patient size and/or use of iterative reconstruction technique. CONTRAST:  OMNIPAQUE IOHEXOL 300 MG/ML  SOLN COMPARISON:  09/21/2021 FINDINGS: Lower chest: Patchy infiltrates in the lung bases, greater on the left. This may represent atelectasis or pneumonia. Moderate-sized esophageal hiatal hernia. Hepatobiliary: No focal liver abnormality is seen. No gallstones, gallbladder wall thickening,  or biliary dilatation. Pancreas: Unremarkable. No pancreatic ductal dilatation or surrounding inflammatory changes. Spleen: Normal in size without focal abnormality. Adrenals/Urinary Tract: Adrenal glands are unremarkable. Kidneys are normal, without renal calculi, focal lesion, or hydronephrosis. Bladder is unremarkable. Stomach/Bowel: Stomach, small bowel, and colon are not abnormally distended. Stool throughout the colon. Sigmoid colonic diverticulosis. Edema and stranding in the pericolonic fat around the sigmoid colon. Changes are consistent with acute diverticulitis. No loculated fluid collections. Small amount of free fluid in the low pelvis is likely reactive. Vascular/Lymphatic: Aortic atherosclerosis. No enlarged abdominal or pelvic lymph nodes. Reproductive: Uterus and bilateral adnexa are unremarkable. Other: No free air in the abdomen. Abdominal wall musculature appears intact. Musculoskeletal: No acute or significant osseous findings. IMPRESSION: 1. Sigmoid colonic diverticulosis with pericolonic inflammatory changes and edema consistent with acute diverticulitis. No abscess. 2. Small amount of free fluid in the pelvis is likely reactive. 3. Moderate-sized esophageal hiatal hernia. 4. Atelectasis or infiltration in the lung bases. 5. Aortic atherosclerosis. Electronically Signed  By: Burman Nieves M.D.   On: 11/02/2023 22:38           LOS: 1 day   Time spent= 35 mins    Miguel Rota, MD [facility]  If 7PM-7AM, please contact night-coverage  11/03/2023, 11:22 AM

## 2023-11-03 NOTE — Progress Notes (Signed)
 NP Garner Nash was notified that patient is asking for Tylenol for pain.

## 2023-11-03 NOTE — Plan of Care (Signed)
   Problem: Education: Goal: Knowledge of General Education information will improve Description Including pain rating scale, medication(s)/side effects and non-pharmacologic comfort measures Outcome: Progressing   Problem: Health Behavior/Discharge Planning: Goal: Ability to manage health-related needs will improve Outcome: Progressing

## 2023-11-03 NOTE — Hospital Course (Addendum)
 Brief Narrative:   73 year old with history of diverticular disease, celiac disease, HTN, GIST tumor, hypothyroidism, hyperlipidemia admitted for acute diverticulitis.  She failed outpatient treatment with 5 days of p.o. Augmentin.  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  Currently on IV Zosyn, will de-escalate. Will need repeat C scope in 8 weeks.  - Supportive care, antiemetics.  [facility] colonoscopy 2022-severe diverticulosis, polyps removed  Hypokalemia - As needed repletion  Essential hypertension - IV as needed  Asthma - Bronchodilators  HLD - Statin  Hypothyroidism - Synthroid  GIST tumor History of celiac's disease -Follows outpatient GI  DVT prophylaxis: SCDs Start: 11/03/23 0013    Code Status: Full Code Family Communication:   Status is: Inpatient Remains inpatient appropriate because: Continue hospital stay for IV antibiotics.    Subjective: Still abdominal pain with slight improvement since yesterday.   Examination:  General exam: Appears calm and comfortable  Respiratory system: Clear to auscultation. Respiratory effort normal. Cardiovascular system: S1 & S2 heard, RRR. No JVD, murmurs, rubs, gallops or clicks. No pedal edema. Gastrointestinal system: Abdomen tender to deep palpation Central nervous system: Alert and oriented. No focal neurological deficits. Extremities: Symmetric 5 x 5 power. Skin: No rashes, lesions or ulcers Psychiatry: Judgement and insight appear normal. Mood & affect appropriate.

## 2023-11-03 NOTE — Progress Notes (Signed)
   11/03/23 1057  TOC Brief Assessment  Insurance and Status Reviewed  Patient has primary care physician Yes  Home environment has been reviewed resides in private residence with spouse  Prior level of function: Independent  Prior/Current Home Services No current home services  Social Drivers of Health Review SDOH reviewed no interventions necessary  Readmission risk has been reviewed Yes  Transition of care needs no transition of care needs at this time

## 2023-11-04 DIAGNOSIS — K5792 Diverticulitis of intestine, part unspecified, without perforation or abscess without bleeding: Secondary | ICD-10-CM | POA: Diagnosis not present

## 2023-11-04 LAB — BASIC METABOLIC PANEL
Anion gap: 5 (ref 5–15)
BUN: 8 mg/dL (ref 8–23)
CO2: 25 mmol/L (ref 22–32)
Calcium: 8.7 mg/dL — ABNORMAL LOW (ref 8.9–10.3)
Chloride: 108 mmol/L (ref 98–111)
Creatinine, Ser: 0.68 mg/dL (ref 0.44–1.00)
GFR, Estimated: >60 mL/min (ref >60)
Glucose, Bld: 85 mg/dL (ref 70–99)
Potassium: 3.4 mmol/L — ABNORMAL LOW (ref 3.5–5.1)
Sodium: 138 mmol/L (ref 135–145)

## 2023-11-04 LAB — CBC
HCT: 38.6 % (ref 36.0–46.0)
Hemoglobin: 12.1 g/dL (ref 12.0–15.0)
MCH: 27.2 pg (ref 26.0–34.0)
MCHC: 31.3 g/dL (ref 30.0–36.0)
MCV: 86.7 fL (ref 80.0–100.0)
Platelets: 156 10*3/uL (ref 150–400)
RBC: 4.45 MIL/uL (ref 3.87–5.11)
RDW: 13.8 % (ref 11.5–15.5)
WBC: 3.2 10*3/uL — ABNORMAL LOW (ref 4.0–10.5)
nRBC: 0 % (ref 0.0–0.2)

## 2023-11-04 LAB — MAGNESIUM: Magnesium: 1.8 mg/dL (ref 1.7–2.4)

## 2023-11-04 LAB — PHOSPHORUS: Phosphorus: 2.6 mg/dL (ref 2.5–4.6)

## 2023-11-04 MED ORDER — DOCUSATE SODIUM 100 MG PO CAPS
100.0000 mg | ORAL_CAPSULE | Freq: Two times a day (BID) | ORAL | Status: DC
  Administered 2023-11-04: 100 mg via ORAL
  Filled 2023-11-04: qty 1

## 2023-11-04 MED ORDER — ONDANSETRON 4 MG PO TBDP: 4.0000 mg | ORAL_TABLET | Freq: Three times a day (TID) | ORAL | 0 refills | Status: DC | PRN

## 2023-11-04 MED ORDER — POTASSIUM CHLORIDE CRYS ER 20 MEQ PO TBCR
40.0000 meq | EXTENDED_RELEASE_TABLET | Freq: Once | ORAL | Status: AC
  Administered 2023-11-04: 40 meq via ORAL
  Filled 2023-11-04: qty 2

## 2023-11-04 MED ORDER — POLYETHYLENE GLYCOL 3350 17 G PO PACK
17.0000 g | PACK | Freq: Two times a day (BID) | ORAL | Status: DC
  Administered 2023-11-04: 17 g via ORAL
  Filled 2023-11-04: qty 1

## 2023-11-04 MED ORDER — AMOXICILLIN-POT CLAVULANATE 875-125 MG PO TABS: 1.0000 | ORAL_TABLET | Freq: Two times a day (BID) | ORAL | 0 refills | Status: AC

## 2023-11-04 MED ORDER — MAGNESIUM OXIDE -MG SUPPLEMENT 400 (240 MG) MG PO TABS
800.0000 mg | ORAL_TABLET | Freq: Once | ORAL | Status: AC
  Administered 2023-11-04: 800 mg via ORAL
  Filled 2023-11-04: qty 2

## 2023-11-04 NOTE — Discharge Summary (Signed)
 Physician Discharge Summary  Sharon Shepherd [place] ZOX:[id number] DOB: 1951/04/01 DOA: 11/02/2023  PCP: Karie Schwalbe, MD  Admit date: 11/02/2023 Discharge date: 11/04/2023  Admitted From: Home Disposition: Home  Recommendations for Outpatient Follow-up:  Follow up with PCP in 1-2 weeks Please obtain BMP/CBC in one week your next doctors visit.  7 days of p.o. Augmentin Outpatient GI follow-up, appointment has been made for 5/19   Discharge Condition: Stable CODE STATUS: Full code Diet recommendation: Soft  Brief/Interim Summary: Brief Narrative:   73 year old with history of diverticular disease, celiac disease, HTN, GIST tumor, hypothyroidism, hyperlipidemia admitted for acute diverticulitis.  She failed outpatient treatment with 5 days of p.o. Augmentin.  During the hospitalization started on Rocephin and Flagyl.  Today she is doing significantly well therefore we will advance her diet.  If she tolerates this, we will discharge her on 7 more days of Augmentin.  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  Patient currently on Rocephin and Flagyl, doing well if she tolerates p.o. diet, we will discharge her on 7 more days of Augmentin with outpatient GI follow-up.  [facility] colonoscopy 2022-severe diverticulosis, polyps removed  Hypokalemia - As needed repletion  Essential hypertension - IV as needed  Asthma - Bronchodilators  HLD - Statin  Hypothyroidism - Synthroid  GIST tumor History of celiac's disease -Follows outpatient GI  DVT prophylaxis: SCDs Start: 11/03/23 0013    Code Status: Full Code Family Communication: Spouse at bedside Status is: Inpatient Remains  inpatient appropriate because: Discharge Subjective: Feeling well no complaints ambulating in the hallway  Examination:  General exam: Appears calm and comfortable  Respiratory system: Clear to auscultation. Respiratory effort normal. Cardiovascular system: S1 & S2 heard, RRR. No JVD, murmurs, rubs, gallops or clicks. No pedal edema. Gastrointestinal system: Abdomen tender to deep palpation Central nervous system: Alert and oriented. No focal neurological deficits. Extremities: Symmetric 5 x 5 power. Skin: No rashes, lesions or ulcers Psychiatry: Judgement and insight appear normal. Mood & affect appropriate.    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)      Discharge Exam: Vitals:   11/03/23 2021 11/04/23 0609  BP: 116/70 123/71  Pulse: 70 60  Resp: 16 17  Temp: 97.8 F (36.6 C) 97.6 F (36.4 C)  SpO2: 99% 96%   Vitals:   11/03/23 0530 11/03/23 1349 11/03/23 2021 11/04/23 0609  BP: 104/71 104/69 116/70 123/71  Pulse: 72 63 70 60  Resp: 15  16 17   Temp: 98.5 F (36.9 C) (!) 97.5 F (36.4 C) 97.8 F (36.6 C) 97.6 F (36.4 C)  TempSrc: Oral Oral Oral   SpO2: 97% 98% 99% 96%  Weight:      Height:          Discharge Instructions   Allergies as of 11/04/2023       Reactions   Codeine Itching        Medication List     STOP taking these medications    doxycycline 100 MG tablet Commonly known as: ADOXA  TAKE these medications    albuterol 108 (90 Base) MCG/ACT inhaler Commonly known as: VENTOLIN HFA Inhale 2 puffs into the lungs every 6 (six) hours as needed for wheezing or shortness of breath.   amoxicillin-clavulanate 875-125 MG tablet Commonly known as: AUGMENTIN Take 1 tablet by mouth 2 (two) times daily for 7 days.   aspirin 81 MG  chewable tablet Chew 81 mg by mouth every other day. In the evening   atorvastatin 20 MG tablet Commonly known as: LIPITOR TAKE 1 TABLET BY MOUTH ONCE  DAILY   benzonatate 200 MG capsule Commonly known as: TESSALON Take 200 mg by mouth as directed.   cetirizine 10 MG tablet Commonly known as: ZYRTEC TAKE 1 TABLET BY MOUTH DAILY.   cyanocobalamin 1000 MCG/ML injection Commonly known as: VITAMIN B12 Inject 1 mL (1,000 mcg total) into the muscle every 30 (thirty) days.   diazepam 5 MG tablet Commonly known as: VALIUM Take 1 tablet (5 mg total) by mouth 3 (three) times daily as needed. For severe vertigo   levothyroxine 50 MCG tablet Commonly known as: SYNTHROID TAKE 1 TABLET BY MOUTH DAILY   Linzess 290 MCG Caps capsule Generic drug: linaclotide TAKE ONE CAPSULE BY MOUTH EVERY OTHER DAY IN THE MORNING What changed: See the new instructions.   meclizine 25 MG tablet Commonly known as: ANTIVERT Take 1-2 tablets (25-50 mg total) by mouth 3 (three) times daily as needed. For vertigo   ondansetron 4 MG disintegrating tablet Commonly known as: ZOFRAN-ODT Take 1 tablet (4 mg total) by mouth every 8 (eight) hours as needed.   Syringe/Needle (Disp) 25G X 1-1/2" 1 ML Misc 1 each by Does not apply route every 30 (thirty) days.   valsartan-hydrochlorothiazide 320-12.5 MG tablet Commonly known as: DIOVAN-HCT TAKE 1 TABLET BY MOUTH DAILY   Vitamin D3 125 MCG (5000 UT) Caps Take 5,000 Units by mouth at bedtime.        Follow-up Information     Zehr, Princella Pellegrini, PA-C Follow up on 12/29/2023.   Specialty: Gastroenterology Why: 9:00 AM Contact information: [address] [place] [address] [phone]                Allergies  Allergen Reactions   Codeine Itching    You were cared for by a hospitalist during your hospital stay. If you have any questions about your discharge medications or the care you received while you were in the hospital after you are  discharged, you can call the unit and asked to speak with the hospitalist on call if the hospitalist that took care of you is not available. Once you are discharged, your primary care physician will handle any further medical issues. Please note that no refills for any discharge medications will be authorized once you are discharged, as it is imperative that you return to your primary care physician (or establish a relationship with a primary care physician if you do not have one) for your aftercare needs so that they can reassess your need for medications and monitor your lab values.  You were cared for by a hospitalist during your hospital stay. If you have any questions about your discharge medications or the care you received while you were in the hospital after you are discharged, you can call the unit and asked to speak with the hospitalist on call if the hospitalist that took care of you is not available. Once you are discharged, your primary care physician will handle any further medical issues.  Please note that NO REFILLS for any discharge medications will be authorized once you are discharged, as it is imperative that you return to your primary care physician (or establish a relationship with a primary care physician if you do not have one) for your aftercare needs so that they can reassess your need for medications and monitor your lab values.  Please request your Prim.MD to go over all Hospital Tests and Procedure/Radiological results at the follow up, please get all Hospital records sent to your Prim MD by signing hospital release before you go home.  Get CBC, CMP, 2 view Chest X ray checked  by Primary MD during your next visit or SNF MD in 5-7 days ( we routinely change or add medications that can affect your baseline labs and fluid status, therefore we recommend that you get the mentioned basic workup next visit with your PCP, your PCP may decide not to get them or add new tests based on their  clinical decision)  On your next visit with your primary care physician please Get Medicines reviewed and adjusted.  If you experience worsening of your admission symptoms, develop shortness of breath, life threatening emergency, suicidal or homicidal thoughts you must seek medical attention immediately by calling 911 or calling your MD immediately  if symptoms less severe.  You Must read complete instructions/literature along with all the possible adverse reactions/side effects for all the Medicines you take and that have been prescribed to you. Take any new Medicines after you have completely understood and accpet all the possible adverse reactions/side effects.   Do not drive, operate heavy machinery, perform activities at heights, swimming or participation in water activities or provide baby sitting services if your were admitted for syncope or siezures until you have seen by Primary MD or a Neurologist and advised to do so again.  Do not drive when taking Pain medications.   Procedures/Studies: CT ABDOMEN PELVIS W CONTRAST Result Date: 11/02/2023 CLINICAL DATA:  Left lower quadrant abdominal pain EXAM: CT ABDOMEN AND PELVIS WITH CONTRAST TECHNIQUE: Multidetector CT imaging of the abdomen and pelvis was performed using the standard protocol following bolus administration of intravenous contrast. RADIATION DOSE REDUCTION: This exam was performed according to the departmental dose-optimization program which includes automated exposure control, adjustment of the mA and/or kV according to patient size and/or use of iterative reconstruction technique. CONTRAST:  OMNIPAQUE IOHEXOL 300 MG/ML  SOLN COMPARISON:  09/21/2021 FINDINGS: Lower chest: Patchy infiltrates in the lung bases, greater on the left. This may represent atelectasis or pneumonia. Moderate-sized esophageal hiatal hernia. Hepatobiliary: No focal liver abnormality is seen. No gallstones, gallbladder wall thickening, or biliary  dilatation. Pancreas: Unremarkable. No pancreatic ductal dilatation or surrounding inflammatory changes. Spleen: Normal in size without focal abnormality. Adrenals/Urinary Tract: Adrenal glands are unremarkable. Kidneys are normal, without renal calculi, focal lesion, or hydronephrosis. Bladder is unremarkable. Stomach/Bowel: Stomach, small bowel, and colon are not abnormally distended. Stool throughout the colon. Sigmoid colonic diverticulosis. Edema and stranding in the pericolonic fat around the sigmoid colon. Changes are consistent with acute diverticulitis. No loculated fluid collections. Small amount of free fluid in the low pelvis is likely reactive. Vascular/Lymphatic: Aortic atherosclerosis. No enlarged abdominal or pelvic lymph nodes. Reproductive: Uterus and bilateral adnexa are unremarkable. Other: No free air in the abdomen. Abdominal wall musculature appears intact. Musculoskeletal: No acute or significant osseous findings. IMPRESSION: 1. Sigmoid colonic diverticulosis with pericolonic inflammatory changes and edema consistent with acute diverticulitis. No abscess. 2. Small amount of free  fluid in the pelvis is likely reactive. 3. Moderate-sized esophageal hiatal hernia. 4. Atelectasis or infiltration in the lung bases. 5. Aortic atherosclerosis. Electronically Signed   By: Burman Nieves M.D.   On: 11/02/2023 22:38     The results of significant diagnostics from this hospitalization (including imaging, microbiology, ancillary and laboratory) are listed below for reference.     Microbiology: No results found for this or any previous visit (from the past 240 hours).   Labs: BNP (last 3 results) No results for input(s): "BNP" in the last 8760 hours. Basic Metabolic Panel: Recent Labs  Lab 11/02/23 2114 11/03/23 0432 11/04/23 0457  NA 138 138 138  K 3.1* 3.4* 3.4*  CL 105 105 108  CO2 23 25 25   GLUCOSE 132* 106* 85  BUN 17 15 8   CREATININE 0.78 0.80 0.68  CALCIUM 9.3 9.1 8.7*   MG  --   --  1.8  PHOS  --   --  2.6   Liver Function Tests: Recent Labs  Lab 11/02/23 2114 11/03/23 0432  AST 25 23  ALT 20 19  ALKPHOS 76 72  BILITOT 1.7* 1.5*  PROT 7.5 6.9  ALBUMIN 4.0 3.7   Recent Labs  Lab 11/02/23 2114  LIPASE 32   No results for input(s): "AMMONIA" in the last 168 hours. CBC: Recent Labs  Lab 11/02/23 2114 11/03/23 0432 11/04/23 0457  WBC 7.7 5.3 3.2*  HGB 13.3 12.8 12.1  HCT 40.6 40.3 38.6  MCV 83.2 85.7 86.7  PLT 195 182 156   Cardiac Enzymes: No results for input(s): "CKTOTAL", "CKMB", "CKMBINDEX", "TROPONINI" in the last 168 hours. BNP: Invalid input(s): "POCBNP" CBG: No results for input(s): "GLUCAP" in the last 168 hours. D-Dimer No results for input(s): "DDIMER" in the last 72 hours. Hgb A1c No results for input(s): "HGBA1C" in the last 72 hours. Lipid Profile No results for input(s): "CHOL", "HDL", "LDLCALC", "TRIG", "CHOLHDL", "LDLDIRECT" in the last 72 hours. Thyroid function studies No results for input(s): "TSH", "T4TOTAL", "T3FREE", "THYROIDAB" in the last 72 hours.  Invalid input(s): "FREET3" Anemia work up No results for input(s): "VITAMINB12", "FOLATE", "FERRITIN", "TIBC", "IRON", "RETICCTPCT" in the last 72 hours. Urinalysis    Component Value Date/Time   COLORURINE YELLOW 11/02/2023 2114   APPEARANCEUR HAZY (A) 11/02/2023 2114   LABSPEC 1.025 11/02/2023 2114   PHURINE 5.0 11/02/2023 2114   GLUCOSEU NEGATIVE 11/02/2023 2114   HGBUR NEGATIVE 11/02/2023 2114   BILIRUBINUR NEGATIVE 11/02/2023 2114   KETONESUR NEGATIVE 11/02/2023 2114   PROTEINUR NEGATIVE 11/02/2023 2114   NITRITE NEGATIVE 11/02/2023 2114   LEUKOCYTESUR TRACE (A) 11/02/2023 2114   Sepsis Labs Recent Labs  Lab 11/02/23 2114 11/03/23 0432 11/04/23 0457  WBC 7.7 5.3 3.2*   Microbiology No results found for this or any previous visit (from the past 240 hours).   Time coordinating discharge:  I have spent 35 minutes face to face with the  patient and on the ward discussing the patients care, assessment, plan and disposition with other care givers. >50% of the time was devoted counseling the patient about the risks and benefits of treatment/Discharge disposition and coordinating care.   SIGNED:   Miguel Rota, MD  [facility] 11/04/2023, 12:15 PM   If 7PM-7AM, please contact night-coverage

## 2023-11-06 ENCOUNTER — Ambulatory Visit: Admitting: Internal Medicine

## 2023-11-06 ENCOUNTER — Encounter: Payer: Self-pay | Admitting: Internal Medicine

## 2023-11-06 VITALS — BP 110/70 | HR 60 | Temp 97.8°F | Ht 65.0 in | Wt 207.0 lb

## 2023-11-06 DIAGNOSIS — K5792 Diverticulitis of intestine, part unspecified, without perforation or abscess without bleeding: Secondary | ICD-10-CM | POA: Diagnosis not present

## 2023-11-06 NOTE — Progress Notes (Signed)
 Subjective:    Patient ID: Sharon Shepherd, female    DOB: 1950/08/19, 73 y.o.   MRN: [id number]  HPI Here with husband for hospital follow up  Started just over a week ago Abdominal pain fairly generalized---and worsened Bloating, etc Called Dr Leone Payor 3/21---Rx augmentin given for presumed diverticulitis Still worsened--eventually couldn't even stand up So ER 3/23---CT showed uncomplicated diverticulitis Given rocephin and flagyl No appetite at first No fever  Did improve with Rx Slight stool while there Home 3/25 on augmentin  Now moving bowels --liquid like and darker (not black) Still little appetite--yogurt, protein shakes Abdominal pain is better No N/V  Current Outpatient Medications on File Prior to Visit  Medication Sig Dispense Refill   albuterol (VENTOLIN HFA) 108 (90 Base) MCG/ACT inhaler Inhale 2 puffs into the lungs every 6 (six) hours as needed for wheezing or shortness of breath. 8 g 0   amoxicillin-clavulanate (AUGMENTIN) 875-125 MG tablet Take 1 tablet by mouth 2 (two) times daily for 7 days. 14 tablet 0   aspirin 81 MG chewable tablet Chew 81 mg by mouth every other day. In the evening     atorvastatin (LIPITOR) 20 MG tablet TAKE 1 TABLET BY MOUTH ONCE  DAILY 90 tablet 3   cetirizine (ZYRTEC) 10 MG tablet TAKE 1 TABLET BY MOUTH DAILY. 90 tablet 3   Cholecalciferol (VITAMIN D3) 5000 UNITS CAPS Take 5,000 Units by mouth at bedtime.     Cyanocobalamin (B-12) 5000 MCG SUBL Place 1 tablet under the tongue daily in the afternoon.     diazepam (VALIUM) 5 MG tablet Take 1 tablet (5 mg total) by mouth 3 (three) times daily as needed. For severe vertigo 30 tablet 0   levothyroxine (SYNTHROID) 50 MCG tablet TAKE 1 TABLET BY MOUTH DAILY 90 tablet 3   LINZESS 290 MCG CAPS capsule TAKE ONE CAPSULE BY MOUTH EVERY OTHER DAY IN THE MORNING (Patient taking differently: Take 290 mcg by mouth daily as needed (Constipation).) 90 capsule 1   meclizine (ANTIVERT) 25 MG  tablet Take 1-2 tablets (25-50 mg total) by mouth 3 (three) times daily as needed. For vertigo 60 tablet 0   ondansetron (ZOFRAN-ODT) 4 MG disintegrating tablet Take 1 tablet (4 mg total) by mouth every 8 (eight) hours as needed. 30 tablet 0   valsartan-hydrochlorothiazide (DIOVAN-HCT) 320-12.5 MG tablet TAKE 1 TABLET BY MOUTH DAILY 90 tablet 3   cyanocobalamin (VITAMIN B12) 1000 MCG/ML injection Inject 1 mL (1,000 mcg total) into the muscle every 30 (thirty) days. (Patient not taking: Reported on 11/03/2023) 3 mL 3   Syringe/Needle, Disp, 25G X 1-1/2" 1 ML MISC 1 each by Does not apply route every 30 (thirty) days. (Patient not taking: Reported on 11/06/2023) 1 each 11   No current facility-administered medications on file prior to visit.    Allergies  Allergen Reactions   Codeine Itching    Past Medical History:  Diagnosis Date   Allergy    Anemia    Celiac disease    COVID 09/25/2020   Diverticulitis    GERD (gastroesophageal reflux disease)    GIST (gastrointestinal stroma tumor), malignant, colon (HCC)    Hyperlipidemia    Hypertension    Sleep disturbance    Thyroiditis, lymphocytic    surgery 2013   Vitamin B12 deficiency    unresponsive to oral replacement    Past Surgical History:  Procedure Laterality Date   COLONOSCOPY     ESOPHAGOGASTRODUODENOSCOPY  03/04   colon   ESOPHAGOGASTRODUODENOSCOPY  N/A 02/15/2021   Procedure: ESOPHAGOGASTRODUODENOSCOPY (EGD);  Surgeon: Rachael Fee, MD;  Location: [facility];  Service: Endoscopy;  Laterality: N/A;   ESOPHAGOGASTRODUODENOSCOPY N/A 06/20/2022   Procedure: ESOPHAGOGASTRODUODENOSCOPY (EGD);  Surgeon: Lemar Lofty., MD;  Location: [facility];  Service: Gastroenterology;  Laterality: N/A;   EUS N/A 02/15/2021   Procedure: UPPER ENDOSCOPIC ULTRASOUND (EUS) RADIAL;  Surgeon: Rachael Fee, MD;  Location: [facility];  Service: Endoscopy;  Laterality: N/A;   EUS N/A 06/20/2022   Procedure: UPPER ENDOSCOPIC  ULTRASOUND (EUS) RADIAL;  Surgeon: Lemar Lofty., MD;  Location: [facility];  Service: Gastroenterology;  Laterality: N/A;   EUS N/A 10/23/2023   Procedure: UPPER ENDOSCOPIC ULTRASOUND (EUS) RADIAL;  Surgeon: Lemar Lofty., MD;  Location: [facility];  Service: Gastroenterology;  Laterality: N/A;   FINE NEEDLE ASPIRATION N/A 02/15/2021   Procedure: FINE NEEDLE ASPIRATION (FNA) LINEAR;  Surgeon: Rachael Fee, MD;  Location: [facility];  Service: Endoscopy;  Laterality: N/A;   Laryngeal implant  2/13   after damage during thyroidectomy   THYROIDECTOMY  12/12   Lymphocytic thyroiditis with goiter--Dr Renda Rolls    Family History  Problem Relation Age of Onset   Other Father        pancreatic absess   Heart attack Mother    Colon cancer Neg Hx    Breast cancer Neg Hx    Esophageal cancer Neg Hx    Stomach cancer Neg Hx    Rectal cancer Neg Hx     Social History   Socioeconomic History   Marital status: Married    Spouse name: Not on file   Number of children: 2   Years of education: Not on file   Highest education level: Not on file  Occupational History   Occupation: Primary school teacher: [employer]  Tobacco Use   Smoking status: Never    Passive exposure: Past (as a child)   Smokeless tobacco: Never  Vaping Use   Vaping status: Never Used  Substance and Sexual Activity   Alcohol use: Not Currently    Comment: rare   Drug use: No   Sexual activity: Not on file  Other Topics Concern   Not on file  Social History Narrative   The patient is married and has 2 children.   She is employed in Firefighter at [employer]   Rare alcohol and never tobacco      No living will   Husband is health care POA--sons are alternates   Would accept resuscitation   Wouldn't want long term tube feeds   Social Drivers of Health   Financial Resource Strain: Not on file  Food Insecurity: No Food Insecurity (11/03/2023)   Hunger Vital Sign    Worried About  Running Out of Food in the Last Year: Never true    Ran Out of Food in the Last Year: Never true  Transportation Needs: No Transportation Needs (11/03/2023)   PRAPARE - Administrator, Civil Service (Medical): No    Lack of Transportation (Non-Medical): No  Physical Activity: Not on file  Stress: Not on file  Social Connections: Unknown (11/03/2023)   Social Connection and Isolation Panel [NHANES]    Frequency of Communication with Friends and Family: More than three times a week    Frequency of Social Gatherings with Friends and Family: More than three times a week    Attends Religious Services: Patient declined    Active Member of Clubs or  Organizations: Patient declined    Attends Banker Meetings: Patient declined    Marital Status: Married  Catering manager Violence: Not At Risk (11/03/2023)   Humiliation, Afraid, Rape, and Kick questionnaire    Fear of Current or Ex-Partner: No    Emotionally Abused: No    Physically Abused: No    Sexually Abused: No   Review of Systems Did have episode last year also No cough or SOB No urinary symptoms    Objective:   Physical Exam Constitutional:      Appearance: Normal appearance.  Cardiovascular:     Rate and Rhythm: Normal rate and regular rhythm.     Heart sounds: No murmur heard.    No gallop.  Pulmonary:     Effort: Pulmonary effort is normal.     Breath sounds: Normal breath sounds. No wheezing or rales.  Abdominal:     General: Bowel sounds are normal. There is no distension.     Palpations: Abdomen is soft.     Tenderness: There is no guarding or rebound.     Comments: Mild mostly suprapubic tenderness  Musculoskeletal:     Cervical back: Neck supple.  Lymphadenopathy:     Cervical: No cervical adenopathy.  Neurological:     Mental Status: She is alert.            Assessment & Plan:

## 2023-11-06 NOTE — Assessment & Plan Note (Signed)
 Improved but not resolved yet Will finish the augmentin Slowly increase her eating--and expect her stools to normalize over time Will follow up with Dr Leone Payor Discussed elective sigmoid resection if frequent recurrences

## 2023-12-10 ENCOUNTER — Other Ambulatory Visit: Payer: Self-pay | Admitting: Internal Medicine

## 2023-12-29 ENCOUNTER — Encounter: Payer: Self-pay | Admitting: Gastroenterology

## 2023-12-29 ENCOUNTER — Ambulatory Visit: Admitting: Gastroenterology

## 2023-12-29 VITALS — BP 102/70 | HR 72 | Ht 66.0 in | Wt 208.0 lb

## 2023-12-29 DIAGNOSIS — Z8719 Personal history of other diseases of the digestive system: Secondary | ICD-10-CM | POA: Insufficient documentation

## 2023-12-29 DIAGNOSIS — K5732 Diverticulitis of large intestine without perforation or abscess without bleeding: Secondary | ICD-10-CM | POA: Diagnosis not present

## 2023-12-29 DIAGNOSIS — C49A0 Gastrointestinal stromal tumor, unspecified site: Secondary | ICD-10-CM

## 2023-12-29 NOTE — Patient Instructions (Signed)
 Follow up as needed.   Contact our office if you have any recurrent diverticulitis symptoms.   _______________________________________________________  If your blood pressure at your visit was 140/90 or greater, please contact your primary care physician to follow up on this.  _______________________________________________________  If you are age 71 or older, your body mass index should be between 23-30. Your Body mass index is 33.57 kg/m. If this is out of the aforementioned range listed, please consider follow up with your Primary Care Provider.  If you are age 89 or younger, your body mass index should be between 19-25. Your Body mass index is 33.57 kg/m. If this is out of the aformentioned range listed, please consider follow up with your Primary Care Provider.   ________________________________________________________  The [facility] providers would like to encourage you to use MYCHART to communicate with providers for non-urgent requests or questions.  Due to long hold times on the telephone, sending your provider a message by [facility] may be a faster and more efficient way to get a response.  Please allow 48 business hours for a response.  Please remember that this is for non-urgent requests.  _______________________________________________________

## 2023-12-29 NOTE — Progress Notes (Addendum)
 12/29/2023 Sharon Shepherd [id number] 06-29-1951   HISTORY OF PRESENT ILLNESS: This is a 73 year old female who is a patient of Dr. Jadene Maxwell.  She is here today for follow-up of diverticulitis.  Was treated for diverticulitis in March.  This is her second episode with her first being in February 2023.  She failed outpatient treatment with Augmentin  for 5 days and ended up being hospitalized for 2 days, given IV Rocephin  and Flagyl .  She was then transitioned to 7 more days of Augmentin .  She did well with all that, completed antibiotics.  At this point she feels well with no residual symptoms.  Moving her bowels well, no abdominal pain.  CT scan of the abdomen and pelvis with contrast 10/2023:  IMPRESSION: 1. Sigmoid colonic diverticulosis with pericolonic inflammatory changes and edema consistent with acute diverticulitis. No abscess. 2. Small amount of free fluid in the pelvis is likely reactive. 3. Moderate-sized esophageal hiatal hernia. 4. Atelectasis or infiltration in the lung bases. 5. Aortic atherosclerosis.    Colonoscopy 12/2020:  - Severe diverticulosis in the sigmoid colon, in the descending colon, in the transverse colon and in the ascending colon. - Two diminutive polyps in the distal rectum, removed with a cold biopsy forceps. Resected and retrieved. - The examination was otherwise normal on direct and retroflexion views. An up/ down cable broke during procedure and scope was changed without problem. Patient also was treated for bradycardia with glycopyrrolate  and atropine ( vagal response to scope passage in sigmoid).  Hyperplastic polyps.  10 year recall.   Also has history of a GIST tumor and follows with Dr. Brice Campi for EUS surveillance of that, just had EUS in March 2025.  She tells me that her sister was just diagnosed with FAP.  Says she has had several polyps that have been large and flat, gets colonoscopies very frequently, several times a year, but  most recent colonoscopy her sister had over 100 polyps.  She is asking if this affects her surveillance.   Past Medical History:  Diagnosis Date   Allergy     Anemia    Celiac disease    COVID 09/25/2020   Diverticulitis    GERD (gastroesophageal reflux disease)    GIST (gastrointestinal stroma tumor), malignant, colon (HCC)    Hyperlipidemia    Hypertension    Sleep disturbance    Thyroiditis, lymphocytic    surgery 2013   Vitamin B12 deficiency    unresponsive to oral replacement   Past Surgical History:  Procedure Laterality Date   COLONOSCOPY     ESOPHAGOGASTRODUODENOSCOPY  03/04   colon   ESOPHAGOGASTRODUODENOSCOPY N/A 02/15/2021   Procedure: ESOPHAGOGASTRODUODENOSCOPY (EGD);  Surgeon: Janel Medford, MD;  Location: [facility];  Service: Endoscopy;  Laterality: N/A;   ESOPHAGOGASTRODUODENOSCOPY N/A 06/20/2022   Procedure: ESOPHAGOGASTRODUODENOSCOPY (EGD);  Surgeon: Normie Becton., MD;  Location: [facility];  Service: Gastroenterology;  Laterality: N/A;   EUS N/A 02/15/2021   Procedure: UPPER ENDOSCOPIC ULTRASOUND (EUS) RADIAL;  Surgeon: Janel Medford, MD;  Location: [facility];  Service: Endoscopy;  Laterality: N/A;   EUS N/A 06/20/2022   Procedure: UPPER ENDOSCOPIC ULTRASOUND (EUS) RADIAL;  Surgeon: Normie Becton., MD;  Location: [facility];  Service: Gastroenterology;  Laterality: N/A;   EUS N/A 10/23/2023   Procedure: UPPER ENDOSCOPIC ULTRASOUND (EUS) RADIAL;  Surgeon: Normie Becton., MD;  Location: [facility];  Service: Gastroenterology;  Laterality: N/A;   FINE NEEDLE ASPIRATION N/A 02/15/2021   Procedure: FINE NEEDLE ASPIRATION (FNA)  LINEAR;  Surgeon: Janel Medford, MD;  Location: [facility];  Service: Endoscopy;  Laterality: N/A;   Laryngeal implant  2/13   after damage during thyroidectomy   THYROIDECTOMY  12/12   Lymphocytic thyroiditis with goiter--Dr Hortensia Ma    reports that she has never smoked. She has been exposed  to tobacco smoke. She has never used smokeless tobacco. She reports that she does not currently use alcohol. She reports that she does not use drugs. family history includes Heart attack in her mother; Other in her father. Allergies  Allergen Reactions   Codeine Itching      Outpatient Encounter Medications as of 12/29/2023  Medication Sig   albuterol  (VENTOLIN  HFA) 108 (90 Base) MCG/ACT inhaler Inhale 2 puffs into the lungs every 6 (six) hours as needed for wheezing or shortness of breath.   aspirin 81 MG chewable tablet Chew 81 mg by mouth every other day. In the evening   atorvastatin  (LIPITOR) 20 MG tablet TAKE 1 TABLET BY MOUTH ONCE  DAILY   cetirizine (ZYRTEC) 10 MG tablet TAKE 1 TABLET BY MOUTH DAILY.   Cholecalciferol (VITAMIN D3) 5000 UNITS CAPS Take 5,000 Units by mouth at bedtime.   Cyanocobalamin  (B-12) 5000 MCG SUBL Place 1 tablet under the tongue daily in the afternoon.   cyanocobalamin  (VITAMIN B12) 1000 MCG/ML injection Inject 1 mL (1,000 mcg total) into the muscle every 30 (thirty) days.   diazepam  (VALIUM ) 5 MG tablet Take 1 tablet (5 mg total) by mouth 3 (three) times daily as needed. For severe vertigo   levothyroxine  (SYNTHROID ) 50 MCG tablet TAKE 1 TABLET BY MOUTH DAILY   LINZESS  290 MCG CAPS capsule TAKE ONE CAPSULE BY MOUTH EVERY OTHER DAY IN THE MORNING (Patient taking differently: Take 290 mcg by mouth daily as needed (Constipation).)   meclizine  (ANTIVERT ) 25 MG tablet Take 1-2 tablets (25-50 mg total) by mouth 3 (three) times daily as needed. For vertigo   ondansetron  (ZOFRAN -ODT) 4 MG disintegrating tablet Take 1 tablet (4 mg total) by mouth every 8 (eight) hours as needed.   Syringe/Needle, Disp, 25G X 1-1/2" 1 ML MISC 1 each by Does not apply route every 30 (thirty) days.   valsartan -hydrochlorothiazide  (DIOVAN -HCT) 320-12.5 MG tablet TAKE 1 TABLET BY MOUTH DAILY   No facility-administered encounter medications on file as of 12/29/2023.    REVIEW OF SYSTEMS  :  All other systems reviewed and negative except where noted in the History of Present Illness.   PHYSICAL EXAM: BP 102/70   Pulse 72   Ht 5\' 6"  (1.676 m)   Wt 208 lb (94.3 kg)   SpO2 98%   BMI 33.57 kg/m  General: Well developed white female in no acute distress Head: Normocephalic and atraumatic Eyes:  Sclerae anicteric, conjunctiva pink. Ears: Normal auditory acuity Lungs: Clear throughout to auscultation; no W/R/R. Heart: Regular rate and rhythm; no M/R/G. Abdomen: Soft, non-distended.  BS present.  Non-tender. Musculoskeletal: Symmetrical with no gross deformities  Skin: No lesions on visible extremities  Neurological: Alert oriented x 4, grossly non-focal Psychological:  Alert and cooperative. Normal mood and affect  ASSESSMENT AND PLAN: *Diverticulitis: Episode in March 2025, this was her second episode, the first was in 09/2021.  Did not respond to Augmentin  outpatient initially so was hospitalized for 2 days and received IV Rocephin  and Flagyl  and then transition to p.o. Augmentin  again.  At this point symptoms completely resolved.  Will follow-up as needed, call for any recurrent symptoms. *GIST: Follows with Dr.  Mansouraty for EUS surveillance.  Has had EUS, recall for 2 years.  **Of note, patient's sister was just diagnosed with FAP.  Apparently she is a patient of Dr. Jadene Maxwell as well.  Nikcole was put in for a 10-year colonoscopy recall, not sure if with her sister's diagnosis she should be surveillance sooner.  Will have Dr. Willy Harvest review and comment.   CC:  Helaine Llanos, MD  Primary gastroenterologist:  Need to find out if sister has a genetic abnormality detected or a clinical diagnosis of FAP or polyposis without genetic abnormality. Will have staff contact the patient and clarify.  Kenney Peacemaker, MD, [facility]  Addendum-Sister has colonic polyposis of unclear etiology and has no genetic abnormalities.  I have messaged Ms. Amacher through [employer] that we could  consider repeating a colonoscopy in her 5 years after the last.  I have discussed seeking additional genetic evaluation with her sister as well which could be helpful.  There is a clinic at [facility] that specializes in polyposis syndromes.  Sister is looking into insurance coverage regarding that.

## 2023-12-30 ENCOUNTER — Telehealth: Payer: Self-pay

## 2023-12-30 NOTE — Telephone Encounter (Signed)
 The pt sister name is Dorann Gallus. She is not sure if there is any genetic issues or not. She is going to call and ask and let us  know.

## 2023-12-30 NOTE — Telephone Encounter (Signed)
-----   Message from Nephi D. Zehr sent at 12/30/2023 10:28 AM EDT ----- Regarding: FW: FAP ? Can you please, contact the patient and see what her sister's name is?  She said she is a patient of Dr. Jadene Maxwell, think she said her first name was Edwina Gram.  She acted like Dr. Willy Harvest showed no who she was lol.  Thank you,  Jess ----- Message ----- From: Kenney Peacemaker, MD Sent: 12/29/2023   6:38 PM EDT To: Cortez Dines, PA-C Subject: FAP ?                                          Need to contact her and find out if sister had a genetic abnormality or not.  Would be best to know who sister is.  CEG

## 2024-01-01 NOTE — Telephone Encounter (Signed)
 Recall has been entered for 08/12/2025

## 2024-01-23 ENCOUNTER — Other Ambulatory Visit: Payer: Self-pay | Admitting: Internal Medicine

## 2024-02-02 ENCOUNTER — Encounter: Payer: Self-pay | Admitting: Internal Medicine

## 2024-02-02 ENCOUNTER — Ambulatory Visit (INDEPENDENT_AMBULATORY_CARE_PROVIDER_SITE_OTHER): Payer: 59 | Admitting: Internal Medicine

## 2024-02-02 VITALS — BP 116/80 | HR 73 | Temp 97.8°F | Ht 65.5 in | Wt 208.0 lb

## 2024-02-02 DIAGNOSIS — E538 Deficiency of other specified B group vitamins: Secondary | ICD-10-CM

## 2024-02-02 DIAGNOSIS — C49A2 Gastrointestinal stromal tumor of stomach: Secondary | ICD-10-CM | POA: Diagnosis not present

## 2024-02-02 DIAGNOSIS — Z0000 Encounter for general adult medical examination without abnormal findings: Secondary | ICD-10-CM | POA: Diagnosis not present

## 2024-02-02 DIAGNOSIS — E039 Hypothyroidism, unspecified: Secondary | ICD-10-CM | POA: Diagnosis not present

## 2024-02-02 DIAGNOSIS — I10 Essential (primary) hypertension: Secondary | ICD-10-CM

## 2024-02-02 DIAGNOSIS — M65341 Trigger finger, right ring finger: Secondary | ICD-10-CM | POA: Insufficient documentation

## 2024-02-02 NOTE — Addendum Note (Signed)
 Addended by: JIMMY ADE I on: 02/02/2024 10:39 AM   Modules accepted: Orders

## 2024-02-02 NOTE — Assessment & Plan Note (Signed)
 Healthy Needs to exercise more Plans to retire in December Flu vaccine in the fall--prefers no COVID vaccine Colon due with EGD in 2 years Mammogram due now--and at least every 2 years (probably to 19) No pap due to age

## 2024-02-02 NOTE — Assessment & Plan Note (Signed)
-  On levothyroxine

## 2024-02-02 NOTE — Progress Notes (Signed)
 Subjective:    Patient ID: Sharon Shepherd, female    DOB: [birth year], 73 y.o.   MRN: [id number]  HPI Here for physical With husband  Having trouble with right 4th finger---can lock at times Now hard to make fist--and having pain Has already had cortisone injections  Not really exercising Does some garden work Has gym membership  Did have EGD for GIST tumor Will repeat in 2 years  Avoids gluten and does okay Diverticulitis did resolve  Has diazepam  for vertigo Hasn't needed it  Now just on oral B12 Needs this checked  Current Outpatient Medications on File Prior to Visit  Medication Sig Dispense Refill   albuterol  (VENTOLIN  HFA) 108 (90 Base) MCG/ACT inhaler Inhale 2 puffs into the lungs every 6 (six) hours as needed for wheezing or shortness of breath. 8 g 0   aspirin 81 MG chewable tablet Chew 81 mg by mouth every other day. In the evening     atorvastatin  (LIPITOR) 20 MG tablet TAKE 1 TABLET BY MOUTH ONCE  DAILY 90 tablet 3   cetirizine (ZYRTEC) 10 MG tablet TAKE 1 TABLET BY MOUTH DAILY 90 tablet 3   Cholecalciferol (VITAMIN D3) 5000 UNITS CAPS Take 5,000 Units by mouth at bedtime.     Cyanocobalamin  (B-12) 5000 MCG SUBL Place 1 tablet under the tongue daily in the afternoon.     cyanocobalamin  (VITAMIN B12) 1000 MCG/ML injection Inject 1 mL (1,000 mcg total) into the muscle every 30 (thirty) days. 3 mL 3   diazepam  (VALIUM ) 5 MG tablet Take 1 tablet (5 mg total) by mouth 3 (three) times daily as needed. For severe vertigo 30 tablet 0   levothyroxine  (SYNTHROID ) 50 MCG tablet TAKE 1 TABLET BY MOUTH DAILY 90 tablet 3   LINZESS  290 MCG CAPS capsule TAKE ONE CAPSULE BY MOUTH EVERY OTHER DAY IN THE MORNING (Patient taking differently: Take 290 mcg by mouth daily as needed (Constipation).) 90 capsule 1   meclizine  (ANTIVERT ) 25 MG tablet Take 1-2 tablets (25-50 mg total) by mouth 3 (three) times daily as needed. For vertigo 60 tablet 0   ondansetron  (ZOFRAN -ODT) 4 MG  disintegrating tablet Take 1 tablet (4 mg total) by mouth every 8 (eight) hours as needed. 30 tablet 0   Syringe/Needle, Disp, 25G X 1-1/2 1 ML MISC 1 each by Does not apply route every 30 (thirty) days. 1 each 11   valsartan -hydrochlorothiazide  (DIOVAN -HCT) 320-12.5 MG tablet TAKE 1 TABLET BY MOUTH DAILY 90 tablet 3   No current facility-administered medications on file prior to visit.    Allergies  Allergen Reactions   Codeine Itching    Past Medical History:  Diagnosis Date   Allergy     Anemia    Celiac disease    COVID 09/25/2020   Diverticulitis    GERD (gastroesophageal reflux disease)    GIST (gastrointestinal stroma tumor), malignant, colon (HCC)    Hyperlipidemia    Hypertension    Sleep disturbance    Thyroiditis, lymphocytic    surgery 2013   Vitamin B12 deficiency    unresponsive to oral replacement    Past Surgical History:  Procedure Laterality Date   COLONOSCOPY     ESOPHAGOGASTRODUODENOSCOPY  03/04   colon   ESOPHAGOGASTRODUODENOSCOPY N/A 02/15/2021   Procedure: ESOPHAGOGASTRODUODENOSCOPY (EGD);  Surgeon: Teressa Toribio SQUIBB, MD;  Location: [facility];  Service: Endoscopy;  Laterality: N/A;   ESOPHAGOGASTRODUODENOSCOPY N/A 06/20/2022   Procedure: ESOPHAGOGASTRODUODENOSCOPY (EGD);  Surgeon: Wilhelmenia Aloha Raddle., MD;  Location: [facility];  Service: Gastroenterology;  Laterality: N/A;   EUS N/A 02/15/2021   Procedure: UPPER ENDOSCOPIC ULTRASOUND (EUS) RADIAL;  Surgeon: Teressa Toribio SQUIBB, MD;  Location: [facility];  Service: Endoscopy;  Laterality: N/A;   EUS N/A 06/20/2022   Procedure: UPPER ENDOSCOPIC ULTRASOUND (EUS) RADIAL;  Surgeon: Wilhelmenia Aloha Raddle., MD;  Location: [facility];  Service: Gastroenterology;  Laterality: N/A;   EUS N/A 10/23/2023   Procedure: UPPER ENDOSCOPIC ULTRASOUND (EUS) RADIAL;  Surgeon: Wilhelmenia Aloha Raddle., MD;  Location: [facility];  Service: Gastroenterology;  Laterality: N/A;   FINE NEEDLE ASPIRATION N/A 02/15/2021    Procedure: FINE NEEDLE ASPIRATION (FNA) LINEAR;  Surgeon: Teressa Toribio SQUIBB, MD;  Location: [facility];  Service: Endoscopy;  Laterality: N/A;   Laryngeal implant  2/13   after damage during thyroidectomy   THYROIDECTOMY  12/12   Lymphocytic thyroiditis with goiter--Dr Unknown Sharps    Family History  Problem Relation Age of Onset   Heart attack Mother    Other Father        pancreatic absess   Familial polyposis Sister    Colon cancer Neg Hx    Breast cancer Neg Hx    Esophageal cancer Neg Hx    Stomach cancer Neg Hx    Rectal cancer Neg Hx     Social History   Socioeconomic History   Marital status: Married    Spouse name: Not on file   Number of children: 2   Years of education: Not on file   Highest education level: Not on file  Occupational History   Occupation: Primary school teacher: [employer]  Tobacco Use   Smoking status: Never    Passive exposure: Past (as a child)   Smokeless tobacco: Never  Vaping Use   Vaping status: Never Used  Substance and Sexual Activity   Alcohol use: Not Currently    Comment: rare   Drug use: No   Sexual activity: Not on file  Other Topics Concern   Not on file  Social History Narrative   The patient is married and has 2 children.   She is employed in Firefighter at [employer]   Rare alcohol and never tobacco      No living will   Husband is health care POA--sons are alternates   Would accept resuscitation   Wouldn't want long term tube feeds   Social Drivers of Health   Financial Resource Strain: Not on file  Food Insecurity: No Food Insecurity (11/03/2023)   Hunger Vital Sign    Worried About Running Out of Food in the Last Year: Never true    Ran Out of Food in the Last Year: Never true  Transportation Needs: No Transportation Needs (11/03/2023)   PRAPARE - Administrator, Civil Service (Medical): No    Lack of Transportation (Non-Medical): No  Physical Activity: Not on file  Stress: Not on file  Social  Connections: Unknown (11/03/2023)   Social Connection and Isolation Panel    Frequency of Communication with Friends and Family: More than three times a week    Frequency of Social Gatherings with Friends and Family: More than three times a week    Attends Religious Services: Patient declined    Database administrator or Organizations: Patient declined    Attends Banker Meetings: Patient declined    Marital Status: Married  Catering manager Violence: Not At Risk (11/03/2023)   Humiliation, Afraid, Rape, and Kick questionnaire    Fear  of Current or Ex-Partner: No    Emotionally Abused: No    Physically Abused: No    Sexually Abused: No   Review of Systems  Constitutional:  Negative for fatigue and unexpected weight change.       Wears seat belt  HENT:  Negative for dental problem, hearing loss and tinnitus.        Keeps up with dentist  Eyes:  Negative for visual disturbance.       No diplopia or unilateral vision loss  Respiratory:  Negative for cough, chest tightness and shortness of breath.   Cardiovascular:  Negative for chest pain and leg swelling.       Rare palpitations  Gastrointestinal:  Negative for blood in stool and constipation.       No sig heartburn  Endocrine: Negative for polydipsia and polyuria.  Genitourinary:  Negative for dyspareunia, dysuria and hematuria.  Musculoskeletal:  Positive for back pain. Negative for arthralgias and joint swelling.  Skin:  Negative for rash.  Allergic/Immunologic: Positive for environmental allergies. Negative for immunocompromised state.       Satisfied with regimen  Neurological:  Negative for dizziness, syncope, light-headedness and headaches.  Hematological:  Negative for adenopathy. Does not bruise/bleed easily.  Psychiatric/Behavioral:  Negative for dysphoric mood. The patient is not nervous/anxious.        Stable chronic sleep issues---rare nap       Objective:   Physical Exam Constitutional:       Appearance: Normal appearance.  HENT:     Mouth/Throat:     Pharynx: No oropharyngeal exudate or posterior oropharyngeal erythema.   Eyes:     Conjunctiva/sclera: Conjunctivae normal.     Pupils: Pupils are equal, round, and reactive to light.    Cardiovascular:     Rate and Rhythm: Normal rate and regular rhythm.     Pulses: Normal pulses.     Heart sounds: No murmur heard.    No gallop.  Pulmonary:     Effort: Pulmonary effort is normal.     Breath sounds: Normal breath sounds. No wheezing or rales.  Abdominal:     Palpations: Abdomen is soft.     Tenderness: There is no abdominal tenderness.   Musculoskeletal:     Cervical back: Neck supple.     Right lower leg: No edema.     Left lower leg: No edema.  Lymphadenopathy:     Cervical: No cervical adenopathy.   Skin:    Findings: No rash.   Neurological:     General: No focal deficit present.     Mental Status: She is alert and oriented to person, place, and time.   Psychiatric:        Mood and Affect: Mood normal.        Behavior: Behavior normal.            Assessment & Plan:

## 2024-02-02 NOTE — Assessment & Plan Note (Signed)
 Last EGD okay--repeating in 2 years No Rx

## 2024-02-02 NOTE — Assessment & Plan Note (Signed)
 Time to see hand specialist

## 2024-02-02 NOTE — Assessment & Plan Note (Signed)
 BP Readings from Last 3 Encounters:  02/02/24 116/80  12/29/23 102/70  11/06/23 110/70    Good control on valstan/hydrochlorothiazide  320/12.5

## 2024-03-22 ENCOUNTER — Other Ambulatory Visit: Payer: Self-pay | Admitting: Internal Medicine

## 2024-05-24 ENCOUNTER — Other Ambulatory Visit: Payer: Self-pay

## 2024-05-24 MED ORDER — LINACLOTIDE 290 MCG PO CAPS: 290.0000 ug | ORAL_CAPSULE | ORAL | 0 refills | Status: DC

## 2024-05-24 NOTE — Telephone Encounter (Signed)
 Pt needs a TOC in the next few months.

## 2024-05-27 ENCOUNTER — Ambulatory Visit: Attending: Orthopedic Surgery | Admitting: Occupational Therapy

## 2024-05-27 ENCOUNTER — Encounter: Payer: Self-pay | Admitting: Occupational Therapy

## 2024-05-27 DIAGNOSIS — L905 Scar conditions and fibrosis of skin: Secondary | ICD-10-CM | POA: Diagnosis present

## 2024-05-27 DIAGNOSIS — M79641 Pain in right hand: Secondary | ICD-10-CM | POA: Insufficient documentation

## 2024-05-27 DIAGNOSIS — M25641 Stiffness of right hand, not elsewhere classified: Secondary | ICD-10-CM | POA: Insufficient documentation

## 2024-05-27 NOTE — Therapy (Signed)
 OUTPATIENT OCCUPATIONAL THERAPY ORTHO EVALUATION  Patient Name: Sharon Shepherd MRN: [id number] DOB:[birth year], 73 y.o., female Today's Date: 05/27/2024  PCP: Dr Jimmy REFERRING PROVIDER: Dr Camella  END OF SESSION:  OT End of Session - 05/27/24 1714     Visit Number 1    Number of Visits 8    Date for Recertification  07/22/24    OT Start Time 1531    OT Stop Time 1613    OT Time Calculation (min) 42 min    Activity Tolerance Patient tolerated treatment well    Behavior During Therapy [facility] for tasks assessed/performed          Past Medical History:  Diagnosis Date   Allergy     Anemia    Celiac disease    COVID 09/25/2020   Diverticulitis    GERD (gastroesophageal reflux disease)    GIST (gastrointestinal stroma tumor), malignant, colon (HCC)    Hyperlipidemia    Hypertension    Sleep disturbance    Thyroiditis, lymphocytic    surgery 2013   Vitamin B12 deficiency    unresponsive to oral replacement   Past Surgical History:  Procedure Laterality Date   COLONOSCOPY     ESOPHAGOGASTRODUODENOSCOPY  03/04   colon   ESOPHAGOGASTRODUODENOSCOPY N/A 02/15/2021   Procedure: ESOPHAGOGASTRODUODENOSCOPY (EGD);  Surgeon: Teressa Toribio SQUIBB, MD;  Location: [facility];  Service: Endoscopy;  Laterality: N/A;   ESOPHAGOGASTRODUODENOSCOPY N/A 06/20/2022   Procedure: ESOPHAGOGASTRODUODENOSCOPY (EGD);  Surgeon: Wilhelmenia Aloha Raddle., MD;  Location: [facility];  Service: Gastroenterology;  Laterality: N/A;   EUS N/A 02/15/2021   Procedure: UPPER ENDOSCOPIC ULTRASOUND (EUS) RADIAL;  Surgeon: Teressa Toribio SQUIBB, MD;  Location: [facility];  Service: Endoscopy;  Laterality: N/A;   EUS N/A 06/20/2022   Procedure: UPPER ENDOSCOPIC ULTRASOUND (EUS) RADIAL;  Surgeon: Wilhelmenia Aloha Raddle., MD;  Location: [facility];  Service: Gastroenterology;  Laterality: N/A;   EUS N/A 10/23/2023   Procedure: UPPER ENDOSCOPIC ULTRASOUND (EUS) RADIAL;  Surgeon: Wilhelmenia Aloha Raddle., MD;   Location: [facility];  Service: Gastroenterology;  Laterality: N/A;   FINE NEEDLE ASPIRATION N/A 02/15/2021   Procedure: FINE NEEDLE ASPIRATION (FNA) LINEAR;  Surgeon: Teressa Toribio SQUIBB, MD;  Location: [facility];  Service: Endoscopy;  Laterality: N/A;   Laryngeal implant  2/13   after damage during thyroidectomy   THYROIDECTOMY  12/12   Lymphocytic thyroiditis with goiter--Dr Unknown Sharps   Patient Active Problem List   Diagnosis Date Noted   Trigger finger, right ring finger 02/02/2024   History of diverticulitis 12/29/2023   Acute diverticulitis of intestine 11/02/2023   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) 03/19/2021   Iron  deficiency anemia 10/02/2020   Chronic constipation 06/21/2019   Allergic asthma 01/14/2018   Hyperlipidemia    Vitamin B12 deficiency    Hypothyroidism    Routine general medical examination at a health care facility 01/15/2011   Sleep disturbance 06/05/2009   ANEMIA-NOS 10/22/2007   Essential hypertension, benign 10/22/2007   Diverticulosis of colon 10/22/2007   Celiac disease 10/22/2007    ONSET DATE: 04/22/24  REFERRING DIAG: R 4th digit A1pulley release  THERAPY DIAG:  Pain in right hand  Scar condition and fibrosis of skin  Stiffness of right hand, not elsewhere classified  Rationale for Evaluation and Treatment: Rehabilitation  SUBJECTIVE:   SUBJECTIVE STATEMENT: I seen DR Camella today - starting steriod today - shooting pain last night and if trying to make fist - pain increase to 6-7/10 - really stiff in the am  Pt accompanied by: self  PERTINENT HISTORY: A1pulley release of R 4th digit done on 04/22/24 by Dr Camella- follow up today and started on steroid and refer to OT   PRECAUTIONS: None    WEIGHT BEARING RESTRICTIONS:   PAIN:  Are you having pain? 6-7/10 fisting ,  in palm or scar at times   FALLS: Has patient fallen in last 6 months? No  LIVING ENVIRONMENT: Lives with: lives with their  spouse    PLOF: Work on [employer] - [employer] ? - like to garden, do crafts, own house work   PATIENT GOALS: get my finger better so I can use my hand  NEXT MD VISIT: 3 wks   OBJECTIVE:  Note: Objective measures were completed at Evaluation unless otherwise noted.  HAND DOMINANCE: Right  ADLs: Difficulty with making a fist when gripping, pulling, carrying and holding objects  FUNCTIONAL OUTCOME MEASURES: Next session  UPPER EXTREMITY ROM:     Active ROM Right eval Left eval  Shoulder flexion    Shoulder abduction    Shoulder adduction    Shoulder extension    Shoulder internal rotation    Shoulder external rotation    Elbow flexion    Elbow extension    Wrist flexion    Wrist extension 70 decrease digit extention incombination officiated needed   Wrist ulnar deviation    Wrist radial deviation    Wrist pronation    Wrist supination    (Blank rows = not tested)  Active ROM Right eval Left eval  Thumb MCP (0-60)    Thumb IP (0-80)    Thumb Radial abd/add (0-55)     Thumb Palmar abd/add (0-45)     Thumb Opposition to Small Finger     Index MCP (0-90)     Index PIP (0-100)     Index DIP (0-70)      Long MCP (0-90)      Long PIP (0-100)      Long DIP (0-70)      Ring MCP (0-90)  75    Ring PIP (0-100)  90    Ring DIP (0-70)  45    Little MCP (0-90)      Little PIP (0-100)      Little DIP (0-70)      (Blank rows = not tested)    HAND FUNCTION: Grip strength: Right:   lbs; Left:   lbs, Lateral pinch: Right:   lbs, Left:   lbs, and 3 point pinch: Right:   lbs, Left:   lbs  COORDINATION: Decreased because of stiffness and pain  SENSATION: Denies any sensory issues  EDEMA: prox phalanges R 6.4 cm L 5.3 cm ; PIP R 6.6 cm and L 5.7cm  COGNITION: Overall cognitive status: Within functional limits for tasks assessed        TREATMENT DATE: 05/27/24  Modalities: Contrast bath:  Time: 8 Location: R hand  Decrease edema , and pain - prior to review of HEP for scar management and ROM      Patient to hold off on scar mobilization for 2 to 3 days for steroid to work Patient can do contrast differently morning and evening but also middle of the day Soft tissue mobilization rolling over right roller to facilitate soft tissue mobilization and digit extension Cica -Care scar pad provided for nighttime use In combination with Isotoner glove at nighttime to decrease edema Composite digit and wrist extension like prior stretch to be done 10 reps pain-free Tendon glides 10 reps with focus on extension Patient to look at the left hand when performing composite flexion to facilitate increased right digit flexion  PATIENT EDUCATION: Education details: findings of eval and HEP  Person educated: Patient Education method: Explanation, Demonstration, Tactile cues, Verbal cues, and Handouts Education comprehension: verbalized understanding, returned demonstration, verbal cues required, and needs further education     GOALS: Goals reviewed with patient? Yes  SHORT TERM GOALS: Target date: 2wks  Patient's to be independent in home program for tenderness and pain decreased to less than 2/10 with range of motion and soft tissue mobilization Baseline: Patient pain can increase to 6-7/10 over the scar and fourth digit; scar pad provided today.  Patient started on a steroid today.  Limited flexion of fourth digit Goal status: INITIAL  LONG TERM GOALS: Target date: 8 wks  Right fourth digit flexion improved to within normal limits pain-free for patient to hold toothbrush and utensils without increase symptoms Baseline: Pain and tenderness 6-7/10, fourth digit flexion 75 MC flexion, PIP 90 degrees and DIP 45 degrees Goal status: INITIAL  2.  Right wrist and digit extension improved to within normal limits symptom-free for  patient to be able to push push heavy door open Baseline: Decreased MC Extension -30 degrees with a slight pull and discomfort over the scar.  Pain can increase to 6-7/10 per patient Goal status: INITIAL  3.  Scar adhesion and edema improved for patient to have pain-free range of motion and scar mobilization. Baseline: Scar adhere for composite flexion and extension.  Increased tenderness pain can increase to 6-7/10.  Edema increased more than a centimeter at the proximal phalanges compared to the left and 0.8 cm at the PIP Goal status: INITIAL  4.  Right grip and prehension strength improved to more than 75% compared to the left to be able to perform ADLs and IADLs symptom-free Baseline: Not tested 5 weeks postop with increased pain.  Steroid started today Goal status: INITIAL  ASSESSMENT:  CLINICAL IMPRESSION: Patient seen today for occupational therapy evaluation for right dominant hand fourth A1 pulley release by Dr. Camella on 04/22/2024-patient was started on a steroid pack today.  Patient with increased pain 6-7/10 with range of motion and over scar.  Patient with increased edema in fourth digit and palm.  Proximal phalanges increased more than 1 cm compared to the left.  Patient with decreased flexion and extension of fourth digit.  As well as decreased grip and prehension strength.  All limiting patient's functional use of right dominant hand in ADLs and IADLs.  Patient can benefit from skilled OT services to decrease edema and pain as well as scar tissue increase motion and strength to return to prior level of function.  PERFORMANCE DEFICITS: in functional skills including ADLs, IADLs, ROM, strength, pain, flexibility, decreased knowledge of use of DME, and UE functional use,  and psychosocial skills including environmental adaptation and routines and behaviors.   IMPAIRMENTS: are limiting patient from ADLs, IADLs, rest and sleep, play, leisure, and social participation.    COMORBIDITIES: has no other co-morbidities that affects occupational performance. Patient will benefit from skilled OT to address above impairments and improve overall function.  MODIFICATION OR ASSISTANCE TO COMPLETE EVALUATION: No modification of tasks or assist necessary to complete an evaluation.  OT OCCUPATIONAL PROFILE AND HISTORY: Problem focused assessment: Including review of records relating to presenting problem.  CLINICAL DECISION MAKING: LOW - limited treatment options, no task modification necessary  REHAB POTENTIAL: Good for goals  EVALUATION COMPLEXITY: Low    PLAN:  OT FREQUENCY: 1-2x/week  OT DURATION: 8 weeks  PLANNED INTERVENTIONS: 97168 OT Re-evaluation, 97535 self care/ADL training, 02889 therapeutic exercise, 97530 therapeutic activity, 97112 neuromuscular re-education, 97140 manual therapy, 97035 ultrasound, 97018 paraffin, 02960 fluidotherapy, 97034 contrast bath, 97760 Orthotic Initial, [id number] Orthotic/Prosthetic subsequent, passive range of motion, patient/family education, and DME and/or AE instructions    CONSULTED AND AGREED WITH PLAN OF CARE: Patient     Ancel Peters, OTR/L,CLT 05/27/2024, 5:15 PM

## 2024-05-31 ENCOUNTER — Ambulatory Visit: Admitting: Occupational Therapy

## 2024-05-31 DIAGNOSIS — M79641 Pain in right hand: Secondary | ICD-10-CM

## 2024-05-31 DIAGNOSIS — L905 Scar conditions and fibrosis of skin: Secondary | ICD-10-CM

## 2024-05-31 DIAGNOSIS — M25641 Stiffness of right hand, not elsewhere classified: Secondary | ICD-10-CM

## 2024-05-31 NOTE — Therapy (Signed)
 OUTPATIENT OCCUPATIONAL THERAPY ORTHO EVALUATION  Patient Name: Sharon Shepherd MRN: [id number] DOB:[birth year], 73 y.o., female Today's Date: 05/31/2024  PCP: Dr Jimmy REFERRING PROVIDER: Dr Camella  END OF SESSION:  OT End of Session - 05/31/24 1351     Visit Number 2    Number of Visits 8    Date for Recertification  07/22/24    OT Start Time 1352    OT Stop Time 1425    OT Time Calculation (min) 33 min    Activity Tolerance Patient tolerated treatment well    Behavior During Therapy [facility] for tasks assessed/performed          Past Medical History:  Diagnosis Date   Allergy     Anemia    Celiac disease    COVID 09/25/2020   Diverticulitis    GERD (gastroesophageal reflux disease)    GIST (gastrointestinal stroma tumor), malignant, colon (HCC)    Hyperlipidemia    Hypertension    Sleep disturbance    Thyroiditis, lymphocytic    surgery 2013   Vitamin B12 deficiency    unresponsive to oral replacement   Past Surgical History:  Procedure Laterality Date   COLONOSCOPY     ESOPHAGOGASTRODUODENOSCOPY  03/04   colon   ESOPHAGOGASTRODUODENOSCOPY N/A 02/15/2021   Procedure: ESOPHAGOGASTRODUODENOSCOPY (EGD);  Surgeon: Teressa Toribio SQUIBB, MD;  Location: [facility];  Service: Endoscopy;  Laterality: N/A;   ESOPHAGOGASTRODUODENOSCOPY N/A 06/20/2022   Procedure: ESOPHAGOGASTRODUODENOSCOPY (EGD);  Surgeon: Wilhelmenia Aloha Raddle., MD;  Location: [facility];  Service: Gastroenterology;  Laterality: N/A;   EUS N/A 02/15/2021   Procedure: UPPER ENDOSCOPIC ULTRASOUND (EUS) RADIAL;  Surgeon: Teressa Toribio SQUIBB, MD;  Location: [facility];  Service: Endoscopy;  Laterality: N/A;   EUS N/A 06/20/2022   Procedure: UPPER ENDOSCOPIC ULTRASOUND (EUS) RADIAL;  Surgeon: Wilhelmenia Aloha Raddle., MD;  Location: [facility];  Service: Gastroenterology;  Laterality: N/A;   EUS N/A 10/23/2023   Procedure: UPPER ENDOSCOPIC ULTRASOUND (EUS) RADIAL;  Surgeon: Wilhelmenia Aloha Raddle., MD;   Location: [facility];  Service: Gastroenterology;  Laterality: N/A;   FINE NEEDLE ASPIRATION N/A 02/15/2021   Procedure: FINE NEEDLE ASPIRATION (FNA) LINEAR;  Surgeon: Teressa Toribio SQUIBB, MD;  Location: [facility];  Service: Endoscopy;  Laterality: N/A;   Laryngeal implant  2/13   after damage during thyroidectomy   THYROIDECTOMY  12/12   Lymphocytic thyroiditis with goiter--Dr Unknown Sharps   Patient Active Problem List   Diagnosis Date Noted   Trigger finger, right ring finger 02/02/2024   History of diverticulitis 12/29/2023   Acute diverticulitis of intestine 11/02/2023   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) 03/19/2021   Iron  deficiency anemia 10/02/2020   Chronic constipation 06/21/2019   Allergic asthma 01/14/2018   Hyperlipidemia    Vitamin B12 deficiency    Hypothyroidism    Routine general medical examination at a health care facility 01/15/2011   Sleep disturbance 06/05/2009   ANEMIA-NOS 10/22/2007   Essential hypertension, benign 10/22/2007   Diverticulosis of colon 10/22/2007   Celiac disease 10/22/2007    ONSET DATE: 04/22/24  REFERRING DIAG: R 4th digit A1pulley release  THERAPY DIAG:  Pain in right hand  Scar condition and fibrosis of skin  Stiffness of right hand, not elsewhere classified  Rationale for Evaluation and Treatment: Rehabilitation  SUBJECTIVE:   SUBJECTIVE STATEMENT: I could not sleep at night - 4 am awake so I cut back on the steroids and then I did use my palm build my green house this weekend and  it got sore - I knew I should have not done that - still sore but pain better   Pt accompanied by: self  PERTINENT HISTORY: A1pulley release of R 4th digit done on 04/22/24 by Dr Camella- follow up today and started on steroid and refer to OT   PRECAUTIONS: None    WEIGHT BEARING RESTRICTIONS:   PAIN:  Are you having pain? 2/10 fisting ,  in palm or scar at times   FALLS: Has patient fallen in last 6 months? 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  HAND DOMINANCE: Right  ADLs: Difficulty with making a fist when gripping, pulling, carrying and holding objects  FUNCTIONAL OUTCOME MEASURES: Next session  UPPER EXTREMITY ROM:     Active ROM Right eval Left eval  Shoulder flexion    Shoulder abduction    Shoulder adduction    Shoulder extension    Shoulder internal rotation    Shoulder external rotation    Elbow flexion    Elbow extension    Wrist flexion    Wrist extension 70 decrease digit extention incombination officiated needed   Wrist ulnar deviation    Wrist radial deviation    Wrist pronation    Wrist supination    (Blank rows = not tested)  Active ROM Right eval Left eval  Thumb MCP (0-60)    Thumb IP (0-80)    Thumb Radial abd/add (0-55)     Thumb Palmar abd/add (0-45)     Thumb Opposition to Small Finger     Index MCP (0-90)     Index PIP (0-100)     Index DIP (0-70)      Long MCP (0-90)      Long PIP (0-100)      Long DIP (0-70)      Ring MCP (0-90)  75    Ring PIP (0-100)  90    Ring DIP (0-70)  45    Little MCP (0-90)      Little PIP (0-100)      Little DIP (0-70)      (Blank rows = not tested)    HAND FUNCTION: Grip strength: Right:   lbs; Left:   lbs, Lateral pinch: Right:   lbs, Left:   lbs, and 3 point pinch: Right:   lbs, Left:   lbs  COORDINATION: Decreased because of stiffness and pain  SENSATION: Denies any sensory issues  EDEMA: prox phalanges R 6.4 cm L 5.3 cm ; PIP R 6.6 cm and L 5.7cm  COGNITION: Overall cognitive status: Within functional limits for tasks assessed        TREATMENT DATE: 05/31/24  Need new scar pad  And remind pt to use her medication as prescribe and not use tools or her palm forcefull  Work on computer and hand stays on mouse  Modalities: Contrast bath:  Time: 8 Location: R hand  Decrease edema , and pain - prior to review of HEP for scar management and ROM       Patient can do contrast differently morning and evening but also middle of the day Soft tissue mobilization rolling over right roller to facilitate soft tissue mobilization and digit extension - to be done every hour while working to address scar and extnetion - for being on the mouse Can do scar massage over the scar pad every hour -but 3 x day use coban piece for traction and do scar mobs opposite direction of digits ext and flexion  BUT DO NOT SLIDE -pt verbalize understanding Usew mini massager over cica scar pad -and manual  Pt still gender 5/10  Cica -Care scar pad provided for nighttime use again In combination with Isotoner glove at nighttime to decrease edema Composite digit and wrist extension like prior stretch to be done 10 reps pain-free Tendon glides 10 reps with focus on extension Patient to look at the left hand when performing composite flexion to facilitate increased right digit flexion  PATIENT EDUCATION: Education details: findings of eval and HEP  Person educated: Patient Education method: Explanation, Demonstration, Tactile cues, Verbal cues, and Handouts Education comprehension: verbalized understanding, returned demonstration, verbal cues required, and needs further education     GOALS: Goals reviewed with patient? Yes  SHORT TERM GOALS: Target date: 2wks  Patient's to be independent in home program for tenderness and pain decreased to less than 2/10 with range of motion and soft tissue mobilization Baseline: Patient pain can increase to 6-7/10 over the scar and fourth digit; scar pad provided today.   Patient started on a steroid today.  Limited flexion of fourth digit Goal status: INITIAL  LONG TERM GOALS: Target date: 8 wks  Right fourth digit flexion improved to within normal limits pain-free for patient to hold toothbrush and utensils without increase symptoms Baseline: Pain and tenderness 6-7/10, fourth digit flexion 75 MC flexion, PIP 90 degrees and DIP 45 degrees Goal status: INITIAL  2.  Right wrist and digit extension improved to within normal limits symptom-free for patient to be able to push push heavy door open Baseline: Decreased MC Extension -30 degrees with a slight pull and discomfort over the scar.  Pain can increase to 6-7/10 per patient Goal status: INITIAL  3.  Scar adhesion and edema improved for patient to have pain-free range of motion and scar mobilization. Baseline: Scar adhere for composite flexion and extension.  Increased tenderness pain can increase to 6-7/10.  Edema increased more than a centimeter at the proximal phalanges compared to the left and 0.8 cm at the PIP Goal status: INITIAL  4.  Right grip and prehension strength improved to more than 75% compared to the left to be able to perform ADLs and IADLs symptom-free Baseline: Not tested 5 weeks postop with increased pain.  Steroid started today Goal status: INITIAL  ASSESSMENT:  CLINICAL IMPRESSION: Patient seen today for occupational therapy evaluation for right dominant hand fourth A1 pulley release by Dr. Camella on 04/22/2024-patient was started on a steroid pack today.  Patient with increased pain 6-7/10 with range of motion and over scar.  Patient with increased edema in fourth digit and palm.  Proximal phalanges increased more than 1  cm compared to the left.  Patient with decreased flexion and extension of fourth digit.  As well as decreased grip and prehension strength.  NOW pt arrive with less pain - but cut back on her steriod - remind to take as prescribe -and do not use palm to push objects in -  did built green house - focus on scar mobs and digits ext and flexion in combination and while on computer during day - hourly - - show improvement in scar tissue and extention and flexion in session -  All limiting patient's functional use of right dominant hand in ADLs and IADLs.  Patient can benefit from skilled OT services to decrease edema and pain as well as scar tissue increase motion and strength to return to prior level of function.  PERFORMANCE DEFICITS: in functional skills including ADLs, IADLs, ROM, strength, pain, flexibility, decreased knowledge of use of DME, and UE functional use,   and psychosocial skills including environmental adaptation and routines and behaviors.   IMPAIRMENTS: are limiting patient from ADLs, IADLs, rest and sleep, play, leisure, and social participation.   COMORBIDITIES: has no other co-morbidities that affects occupational performance. Patient will benefit from skilled OT to address above impairments and improve overall function.  MODIFICATION OR ASSISTANCE TO COMPLETE EVALUATION: No modification of tasks or assist necessary to complete an evaluation.  OT OCCUPATIONAL PROFILE AND HISTORY: Problem focused assessment: Including review of records relating to presenting problem.  CLINICAL DECISION MAKING: LOW - limited treatment options, no task modification necessary  REHAB POTENTIAL: Good for goals  EVALUATION COMPLEXITY: Low    PLAN:  OT FREQUENCY: 1-2x/week  OT DURATION: 8 weeks  PLANNED INTERVENTIONS: 97168 OT Re-evaluation, 97535 self care/ADL training, 02889 therapeutic exercise, 97530 therapeutic activity, 97112 neuromuscular re-education, 97140 manual therapy, 97035 ultrasound, 97018 paraffin, 02960 fluidotherapy, 97034 contrast bath, 97760 Orthotic Initial, [id number] Orthotic/Prosthetic subsequent, passive range of motion, patient/family education, and DME and/or AE instructions    CONSULTED AND AGREED WITH PLAN OF CARE:  Patient     Ancel Peters, OTR/L,CLT 05/31/2024, 2:33 PM

## 2024-06-08 ENCOUNTER — Ambulatory Visit: Admitting: Occupational Therapy

## 2024-06-08 DIAGNOSIS — M79641 Pain in right hand: Secondary | ICD-10-CM | POA: Diagnosis not present

## 2024-06-08 DIAGNOSIS — M25641 Stiffness of right hand, not elsewhere classified: Secondary | ICD-10-CM

## 2024-06-08 DIAGNOSIS — L905 Scar conditions and fibrosis of skin: Secondary | ICD-10-CM

## 2024-06-08 NOTE — Therapy (Signed)
 OUTPATIENT OCCUPATIONAL THERAPY ORTHO TREATMENT  Patient Name: Sharon Shepherd MRN: [id number] DOB:[birth year], 73 y.o., female Today's Date: 06/08/2024  PCP: Dr Jimmy REFERRING PROVIDER: Dr Camella  END OF SESSION:  OT End of Session - 06/08/24 1408     Visit Number 3    Number of Visits 8    Date for Recertification  07/22/24    OT Start Time 1408    OT Stop Time 1446    OT Time Calculation (min) 38 min    Activity Tolerance Patient tolerated treatment well    Behavior During Therapy [facility] for tasks assessed/performed          Past Medical History:  Diagnosis Date   Allergy     Anemia    Celiac disease    COVID 09/25/2020   Diverticulitis    GERD (gastroesophageal reflux disease)    GIST (gastrointestinal stroma tumor), malignant, colon (HCC)    Hyperlipidemia    Hypertension    Sleep disturbance    Thyroiditis, lymphocytic    surgery 2013   Vitamin B12 deficiency    unresponsive to oral replacement   Past Surgical History:  Procedure Laterality Date   COLONOSCOPY     ESOPHAGOGASTRODUODENOSCOPY  03/04   colon   ESOPHAGOGASTRODUODENOSCOPY N/A 02/15/2021   Procedure: ESOPHAGOGASTRODUODENOSCOPY (EGD);  Surgeon: Teressa Toribio SQUIBB, MD;  Location: [facility];  Service: Endoscopy;  Laterality: N/A;   ESOPHAGOGASTRODUODENOSCOPY N/A 06/20/2022   Procedure: ESOPHAGOGASTRODUODENOSCOPY (EGD);  Surgeon: Wilhelmenia Aloha Raddle., MD;  Location: [facility];  Service: Gastroenterology;  Laterality: N/A;   EUS N/A 02/15/2021   Procedure: UPPER ENDOSCOPIC ULTRASOUND (EUS) RADIAL;  Surgeon: Teressa Toribio SQUIBB, MD;  Location: [facility];  Service: Endoscopy;  Laterality: N/A;   EUS N/A 06/20/2022   Procedure: UPPER ENDOSCOPIC ULTRASOUND (EUS) RADIAL;  Surgeon: Wilhelmenia Aloha Raddle., MD;  Location: [facility];  Service: Gastroenterology;  Laterality: N/A;   EUS N/A 10/23/2023   Procedure: UPPER ENDOSCOPIC ULTRASOUND (EUS) RADIAL;  Surgeon: Wilhelmenia Aloha Raddle., MD;  Location:  [facility];  Service: Gastroenterology;  Laterality: N/A;   FINE NEEDLE ASPIRATION N/A 02/15/2021   Procedure: FINE NEEDLE ASPIRATION (FNA) LINEAR;  Surgeon: Teressa Toribio SQUIBB, MD;  Location: [facility];  Service: Endoscopy;  Laterality: N/A;   Laryngeal implant  2/13   after damage during thyroidectomy   THYROIDECTOMY  12/12   Lymphocytic thyroiditis with goiter--Dr Unknown Sharps   Patient Active Problem List   Diagnosis Date Noted   Trigger finger, right ring finger 02/02/2024   History of diverticulitis 12/29/2023   Acute diverticulitis of intestine 11/02/2023   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) 03/19/2021   Iron  deficiency anemia 10/02/2020   Chronic constipation 06/21/2019   Allergic asthma 01/14/2018   Hyperlipidemia    Vitamin B12 deficiency    Hypothyroidism    Routine general medical examination at a health care facility 01/15/2011   Sleep disturbance 06/05/2009   ANEMIA-NOS 10/22/2007   Essential hypertension, benign 10/22/2007   Diverticulosis of colon 10/22/2007   Celiac disease 10/22/2007    ONSET DATE: 04/22/24  REFERRING DIAG: R 4th digit A1pulley release  THERAPY DIAG:  Pain in right hand  Scar condition and fibrosis of skin  Stiffness of right hand, not elsewhere classified  Rationale for Evaluation and Treatment: Rehabilitation  SUBJECTIVE:   SUBJECTIVE STATEMENT: I am taking the steroid.  I have 2 days left.  I can tell the tenderness over my scar is better.  And my motion.  But I did not  do any exercises yet today. Pt accompanied by: self  PERTINENT HISTORY: A1pulley release of R 4th digit done on 04/22/24 by Dr Camella- follow up today and started on steroid and refer to OT   PRECAUTIONS: None    WEIGHT BEARING RESTRICTIONS:   PAIN:  Are you having pain?  No pain just mostly tightness in the fourth digit FALLS: Has patient fallen in last 6 months? 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  HAND DOMINANCE: Right  ADLs: Difficulty with making a fist when gripping, pulling, carrying and holding objects  FUNCTIONAL OUTCOME MEASURES: Next session  UPPER EXTREMITY ROM:     Active ROM Right eval Left eval  Shoulder flexion    Shoulder abduction    Shoulder adduction    Shoulder extension    Shoulder internal rotation    Shoulder external rotation    Elbow flexion    Elbow extension    Wrist flexion    Wrist extension 70 decrease digit extention incombination officiated needed   Wrist ulnar deviation    Wrist radial deviation    Wrist pronation    Wrist supination    (Blank rows = not tested)  Active ROM Right eval Left eval R 06/08/24  Thumb MCP (0-60)     Thumb IP (0-80)     Thumb Radial abd/add (0-55)      Thumb Palmar abd/add (0-45)      Thumb Opposition to Small Finger      Index MCP (0-90)      Index PIP (0-100)      Index DIP (0-70)       Long MCP (0-90)       Long PIP (0-100)       Long DIP (0-70)       Ring MCP (0-90)  75   90  Ring PIP (0-100)  90   100  Ring DIP (0-70)  45   65  Little MCP (0-90)       Little PIP (0-100)       Little DIP (0-70)       (Blank rows = not tested)    HAND FUNCTION: 06/08/24 Grip strength: Right: 64 lbs; Left: 61 lbs, Lateral pinch: Right: 14 lbs, Left: 12 lbs, and 3 point pinch: Right: 9 lbs, Left: 12 lbs  COORDINATION: Decreased because of stiffness and pain  SENSATION: Denies any sensory issues  EDEMA: prox phalanges R 6.4 cm L 5.3 cm ; PIP R 6.6 cm and L 5.7cm  COGNITION: Overall cognitive status: Within functional limits for tasks assessed        TREATMENT DATE: 06/08/24  Patient was off for long weekend. Patient report using her steroid again.  As prescribed.  And has 2 more days left. Report decreased tenderness of the scar and pain and mostly tightness in the fourth digit. Patient arrived with scar adhesion. Measurements taken.  Great progress in active range of motion.  Grip and prehension within normal range except 3-point in the lower range.  See flowsheet.  Modalities: Paraffin Time: 8 Location: R hand  Decrease stiffness and decrease scar tissue done prior to scar mobilization      Patient can do contrast differently morning and evening but also middle of the day Or heat if no inflammation or pain Soft tissue mobilization rolling over right roller to facilitate soft tissue mobilization and digit extension - to be done every hour while working to address scar and extnetion - for being on the mouse Can do scar massage over the scar pad every hour -but 3 x day use coban piece for traction and do scar mobs opposite direction of digits ext and flexion  BUT DO NOT SLIDE -pt verbalize understanding Focus on scar mobilization today using mini massager, and extractor in combination with digit extension and flexion and manual therapy by OT over Coban for traction in combination with digit flexion extension No tenderness today Cica -Care scar pad for nighttime In combination with Isotoner glove at nighttime to decrease edema Composite digit and wrist extension prayer stretch to be done 10 reps pain-free Tendon glides 10 reps with focus on extension    PATIENT EDUCATION: Education details: findings of eval and HEP  Person educated: Patient Education method: Explanation, Demonstration, Tactile cues, Verbal cues, and Handouts Education comprehension: verbalized understanding, returned demonstration, verbal cues required, and needs further education     GOALS: Goals reviewed with patient?  Yes  SHORT TERM GOALS: Target date: 2wks  Patient's to be independent in home program for tenderness and pain decreased to less than 2/10 with range of motion and soft tissue mobilization Baseline: Patient pain can increase to 6-7/10 over the scar and fourth digit; scar pad provided today.  Patient started on a steroid today.  Limited flexion of fourth digit Goal status: INITIAL  LONG TERM GOALS: Target date: 8 wks  Right fourth digit flexion improved to within normal limits pain-free for patient to hold toothbrush and utensils without increase symptoms Baseline: Pain and tenderness 6-7/10, fourth digit flexion 75 MC flexion, PIP 90 degrees and DIP 45 degrees Goal status: INITIAL  2.  Right wrist and digit extension improved to within normal limits symptom-free for patient to be able to push push heavy door open Baseline: Decreased MC Extension -30 degrees with a slight pull and discomfort over the scar.  Pain can increase to 6-7/10 per patient Goal status: INITIAL  3.  Scar adhesion and edema improved for patient to have pain-free range of motion and scar mobilization. Baseline: Scar adhere for composite flexion and extension.  Increased tenderness pain can increase to 6-7/10.  Edema increased more than a centimeter at the proximal phalanges compared to the left and 0.8 cm at the PIP Goal status: INITIAL  4.  Right grip and prehension strength improved to more than 75% compared to the left to be able to perform ADLs and IADLs symptom-free Baseline: Not tested 5 weeks postop with increased pain.  Steroid started today Goal status: INITIAL  ASSESSMENT:  CLINICAL IMPRESSION: Patient seen today for occupational therapy evaluation for right dominant hand fourth A1 pulley release by Dr. Camella on 04/22/2024-patient  was started on a steroid pack today.  Patient with increased pain 6-7/10 with range of motion and over scar.  Patient with increased edema in fourth digit and palm.  Proximal  phalanges increased more than 1 cm compared to the left.  Patient with decreased flexion and extension of fourth digit.  As well as decreased grip and prehension strength.  NOW patient arrived today with great improvement in active range of motion right fourth digit as well as decreased tenderness and inflammation over the scar.  Patient is taking her steroids as prescribed and has 2 more days left.  Patient still showing some scar adhesion limiting endrange motion.  Able to tolerate scar mobilization better.  Patient to continue with focus on scar mobilization at home in combination with digit extension flexion.  3-4 times a day.  And stretches in between while using the mouse and computer.  All limiting patient's functional use of right dominant hand in ADLs and IADLs.  Patient can benefit from skilled OT services to decrease edema and pain as well as scar tissue increase motion and strength to return to prior level of function.  PERFORMANCE DEFICITS: in functional skills including ADLs, IADLs, ROM, strength, pain, flexibility, decreased knowledge of use of DME, and UE functional use,   and psychosocial skills including environmental adaptation and routines and behaviors.   IMPAIRMENTS: are limiting patient from ADLs, IADLs, rest and sleep, play, leisure, and social participation.   COMORBIDITIES: has no other co-morbidities that affects occupational performance. Patient will benefit from skilled OT to address above impairments and improve overall function.  MODIFICATION OR ASSISTANCE TO COMPLETE EVALUATION: No modification of tasks or assist necessary to complete an evaluation.  OT OCCUPATIONAL PROFILE AND HISTORY: Problem focused assessment: Including review of records relating to presenting problem.  CLINICAL DECISION MAKING: LOW - limited treatment options, no task modification necessary  REHAB POTENTIAL: Good for goals  EVALUATION COMPLEXITY: Low    PLAN:  OT FREQUENCY: 1-2x/week  OT  DURATION: 8 weeks  PLANNED INTERVENTIONS: 97168 OT Re-evaluation, 97535 self care/ADL training, 02889 therapeutic exercise, 97530 therapeutic activity, 97112 neuromuscular re-education, 97140 manual therapy, 97035 ultrasound, 97018 paraffin, 02960 fluidotherapy, 97034 contrast bath, 97760 Orthotic Initial, [id number] Orthotic/Prosthetic subsequent, passive range of motion, patient/family education, and DME and/or AE instructions    CONSULTED AND AGREED WITH PLAN OF CARE: Patient     Ancel Peters, OTR/L,CLT 06/08/2024, 4:08 PM

## 2024-06-10 ENCOUNTER — Ambulatory Visit: Admitting: Occupational Therapy

## 2024-06-10 DIAGNOSIS — M25641 Stiffness of right hand, not elsewhere classified: Secondary | ICD-10-CM

## 2024-06-10 DIAGNOSIS — M79641 Pain in right hand: Secondary | ICD-10-CM

## 2024-06-10 DIAGNOSIS — L905 Scar conditions and fibrosis of skin: Secondary | ICD-10-CM

## 2024-06-10 NOTE — Therapy (Signed)
 OUTPATIENT OCCUPATIONAL THERAPY ORTHO TREATMENT  Patient Name: Sharon Shepherd MRN: [id number] DOB:1951-01-12, 73 y.o., female Today's Date: 06/10/2024  PCP: Dr Jimmy REFERRING PROVIDER: Dr Camella  END OF SESSION:  OT End of Session - 06/10/24 0906     Visit Number 4    Number of Visits 8    Date for Recertification  07/22/24    OT Start Time 0905    OT Stop Time 0931    OT Time Calculation (min) 26 min    Activity Tolerance Patient tolerated treatment well    Behavior During Therapy [facility] for tasks assessed/performed          Past Medical History:  Diagnosis Date   Allergy     Anemia    Celiac disease    COVID 09/25/2020   Diverticulitis    GERD (gastroesophageal reflux disease)    GIST (gastrointestinal stroma tumor), malignant, colon (HCC)    Hyperlipidemia    Hypertension    Sleep disturbance    Thyroiditis, lymphocytic    surgery 2013   Vitamin B12 deficiency    unresponsive to oral replacement   Past Surgical History:  Procedure Laterality Date   COLONOSCOPY     ESOPHAGOGASTRODUODENOSCOPY  03/04   colon   ESOPHAGOGASTRODUODENOSCOPY N/A 02/15/2021   Procedure: ESOPHAGOGASTRODUODENOSCOPY (EGD);  Surgeon: Teressa Toribio SQUIBB, MD;  Location: [facility];  Service: Endoscopy;  Laterality: N/A;   ESOPHAGOGASTRODUODENOSCOPY N/A 06/20/2022   Procedure: ESOPHAGOGASTRODUODENOSCOPY (EGD);  Surgeon: Wilhelmenia Aloha Raddle., MD;  Location: [facility];  Service: Gastroenterology;  Laterality: N/A;   EUS N/A 02/15/2021   Procedure: UPPER ENDOSCOPIC ULTRASOUND (EUS) RADIAL;  Surgeon: Teressa Toribio SQUIBB, MD;  Location: [facility];  Service: Endoscopy;  Laterality: N/A;   EUS N/A 06/20/2022   Procedure: UPPER ENDOSCOPIC ULTRASOUND (EUS) RADIAL;  Surgeon: Wilhelmenia Aloha Raddle., MD;  Location: [facility];  Service: Gastroenterology;  Laterality: N/A;   EUS N/A 10/23/2023   Procedure: UPPER ENDOSCOPIC ULTRASOUND (EUS) RADIAL;  Surgeon: Wilhelmenia Aloha Raddle., MD;  Location:  [facility];  Service: Gastroenterology;  Laterality: N/A;   FINE NEEDLE ASPIRATION N/A 02/15/2021   Procedure: FINE NEEDLE ASPIRATION (FNA) LINEAR;  Surgeon: Teressa Toribio SQUIBB, MD;  Location: [facility];  Service: Endoscopy;  Laterality: N/A;   Laryngeal implant  2/13   after damage during thyroidectomy   THYROIDECTOMY  12/12   Lymphocytic thyroiditis with goiter--Dr Unknown Sharps   Patient Active Problem List   Diagnosis Date Noted   Trigger finger, right ring finger 02/02/2024   History of diverticulitis 12/29/2023   Acute diverticulitis of intestine 11/02/2023   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) 03/19/2021   Iron  deficiency anemia 10/02/2020   Chronic constipation 06/21/2019   Allergic asthma 01/14/2018   Hyperlipidemia    Vitamin B12 deficiency    Hypothyroidism    Routine general medical examination at a health care facility 01/15/2011   Sleep disturbance 06/05/2009   ANEMIA-NOS 10/22/2007   Essential hypertension, benign 10/22/2007   Diverticulosis of colon 10/22/2007   Celiac disease 10/22/2007    ONSET DATE: 04/22/24  REFERRING DIAG: R 4th digit A1pulley release  THERAPY DIAG:  Pain in right hand  Scar condition and fibrosis of skin  Stiffness of right hand, not elsewhere classified  Rationale for Evaluation and Treatment: Rehabilitation  SUBJECTIVE:   SUBJECTIVE STATEMENT: I am taking the steroid.  I have 2 days left.  I can tell the tenderness over my scar is better.  And my motion.  But I did not  do any exercises yet today. Pt accompanied by: self  PERTINENT HISTORY: A1pulley release of R 4th digit done on 04/22/24 by Dr Camella- follow up today and started on steroid and refer to OT   PRECAUTIONS: None    WEIGHT BEARING RESTRICTIONS:   PAIN:  Are you having pain?  No pain just mostly tightness in the fourth digit FALLS: Has patient fallen in last 6 months? 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  HAND DOMINANCE: Right  ADLs: Difficulty with making a fist when gripping, pulling, carrying and holding objects  FUNCTIONAL OUTCOME MEASURES: Next session  UPPER EXTREMITY ROM:     Active ROM Right eval Left eval  Shoulder flexion    Shoulder abduction    Shoulder adduction    Shoulder extension    Shoulder internal rotation    Shoulder external rotation    Elbow flexion    Elbow extension    Wrist flexion    Wrist extension 70 decrease digit extention incombination officiated needed   Wrist ulnar deviation    Wrist radial deviation    Wrist pronation    Wrist supination    (Blank rows = not tested)  Active ROM Right eval Left eval R 06/08/24  Thumb MCP (0-60)     Thumb IP (0-80)     Thumb Radial abd/add (0-55)      Thumb Palmar abd/add (0-45)      Thumb Opposition to Small Finger      Index MCP (0-90)      Index PIP (0-100)      Index DIP (0-70)       Long MCP (0-90)       Long PIP (0-100)       Long DIP (0-70)       Ring MCP (0-90)  75   90  Ring PIP (0-100)  90   100  Ring DIP (0-70)  45   65  Little MCP (0-90)       Little PIP (0-100)       Little DIP (0-70)       (Blank rows = not tested)    HAND FUNCTION: 06/08/24 Grip strength: Right: 64 lbs; Left: 61 lbs, Lateral pinch: Right: 14 lbs, Left: 12 lbs, and 3 point pinch: Right: 9 lbs, Left: 12 lbs 06/10/24 Grip strength: Right: 64 lbs; Left: 61 lbs, Lateral pinch: Right: 14 lbs, Left: 12 lbs, and 3 point pinch: Right: 10 lbs, Left: 12 lbs COORDINATION: Decreased because of stiffness and pain  SENSATION: Denies any sensory issues  EDEMA: prox phalanges R 6.4 cm L 5.3 cm ; PIP R 6.6 cm and L 5.7cm  COGNITION: Overall cognitive status: Within  functional limits for tasks assessed        TREATMENT DATE: 06/10/24  Patient report using her steroid.  As prescribed.  Has few more days left - 1 pill day at the moment Report decreased tenderness of the scar and pain and stiffness improving Patient arrived with scar adhesion improved since last session -but still adhere. Great progress in active range of motion this week  Grip and prehension within normal range except 3-point -but improve today to 10lbs R and L 12 lbs   See flowsheet.  Modalities: Paraffin Time: 8 Location: R hand  Decrease stiffness and decrease scar tissue done prior to scar mobilization      Patient can do contrast or heat  if no inflammation or pain Soft tissue mobilization rolling over right roller to facilitate soft tissue mobilization and digit extension - to be done every hour while working to address scar and extnetion - Pt using mouse with R hand Can do scar massage over the scar pad every hour -but 3 x day use coban piece for traction and do scar mobs opposite direction of digits ext and flexion  BUT DO NOT SLIDE -pt verbalize understanding Focus on scar mobilization today using mini massager, and dry static cupping on palm in combination with digits extention - repeat 3 x 10-15 reps  Also scar mobs manual therapy by OT in  combination with digit extension and flexion over Coban for traction No tenderness today Cica -Care scar pad for nighttime- new one issued In combination with Isotoner glove at nighttime to decrease edema Composite digit and wrist extension prayer stretch to be done 10 reps pain-free Tendon glides 10 reps with focus on extension and with MC flexion with PIP extention    PATIENT EDUCATION: Education details: findings of eval and HEP  Person educated: Patient Education method: Explanation,  Demonstration, Tactile cues, Verbal cues, and Handouts Education comprehension: verbalized understanding, returned demonstration, verbal cues required, and needs further education     GOALS: Goals reviewed with patient? Yes  SHORT TERM GOALS: Target date: 2wks  Patient's to be independent in home program for tenderness and pain decreased to less than 2/10 with range of motion and soft tissue mobilization Baseline: Patient pain can increase to 6-7/10 over the scar and fourth digit; scar pad provided today.  Patient started on a steroid today.  Limited flexion of fourth digit Goal status: INITIAL  LONG TERM GOALS: Target date: 8 wks  Right fourth digit flexion improved to within normal limits pain-free for patient to hold toothbrush and utensils without increase symptoms Baseline: Pain and tenderness 6-7/10, fourth digit flexion 75 MC flexion, PIP 90 degrees and DIP 45 degrees NOW no pain and tenderness- MC flexion 90, PIP 100 and DIP 65 Goal status:MET  2.  Right wrist and digit extension improved to within normal limits symptom-free for patient to be able to push push heavy door open Baseline: Decreased MC Extension -30 degrees with a slight pull and discomfort over the scar.  Pain can increase to 6-7/10 per patient Goal status: INITIAL  3.  Scar adhesion and edema improved for patient to have pain-free range of motion and scar mobilization. Baseline: Scar adhere for composite flexion and extension.  Increased tenderness pain can increase to 6-7/10.  Edema increased more than a centimeter at the proximal phalanges compared to the left and 0.8 cm at the PIP Goal status: INITIAL  4.  Right grip and prehension strength improved to more than 75% compared to the left to be able to perform ADLs and IADLs symptom-free Baseline: Not tested 5 weeks postop  with increased pain.  Steroid started today Goal status: INITIAL  ASSESSMENT:  CLINICAL IMPRESSION: Patient seen today for occupational  therapy evaluation for right dominant hand fourth A1 pulley release by Dr. Camella on 04/22/2024-patient was started on a steroid pack today.  Patient with increased pain 6-7/10 with range of motion and over scar.  Patient with increased edema in fourth digit and palm.  Proximal phalanges increased more than 1 cm compared to the left.  Patient with decreased flexion and extension of fourth digit.  As well as decreased grip and prehension strength.  NOW patient  this week with great improvement in active range of motion right fourth digit as well as decreased tenderness and inflammation over the scar.  Patient is taking her steroids as prescribed and  few more days left per pt today - one pill a day.  Patient still showing some scar adhesion but improving greatly.  Able to tolerate scar mobilization better and cupping today.  Patient to continue with focus on scar mobilization at home in combination with digit extension flexion.  3-4 times a day.  And stretches in between while using the mouse and computer.  All limiting patient's functional use of right dominant hand in ADLs and IADLs.  Patient can benefit from skilled OT services to decrease edema and pain as well as scar tissue increase motion and strength to return to prior level of function.  PERFORMANCE DEFICITS: in functional skills including ADLs, IADLs, ROM, strength, pain, flexibility, decreased knowledge of use of DME, and UE functional use,   and psychosocial skills including environmental adaptation and routines and behaviors.   IMPAIRMENTS: are limiting patient from ADLs, IADLs, rest and sleep, play, leisure, and social participation.   COMORBIDITIES: has no other co-morbidities that affects occupational performance. Patient will benefit from skilled OT to address above impairments and improve overall function.  MODIFICATION OR ASSISTANCE TO COMPLETE EVALUATION: No modification of tasks or assist necessary to complete an evaluation.  OT  OCCUPATIONAL PROFILE AND HISTORY: Problem focused assessment: Including review of records relating to presenting problem.  CLINICAL DECISION MAKING: LOW - limited treatment options, no task modification necessary  REHAB POTENTIAL: Good for goals  EVALUATION COMPLEXITY: Low    PLAN:  OT FREQUENCY: 1-2x/week  OT DURATION: 8 weeks  PLANNED INTERVENTIONS: 97168 OT Re-evaluation, 97535 self care/ADL training, 02889 therapeutic exercise, 97530 therapeutic activity, 97112 neuromuscular re-education, 97140 manual therapy, 97035 ultrasound, 97018 paraffin, 02960 fluidotherapy, 97034 contrast bath, 97760 Orthotic Initial, [id number] Orthotic/Prosthetic subsequent, passive range of motion, patient/family education, and DME and/or AE instructions    CONSULTED AND AGREED WITH PLAN OF CARE: Patient     Ancel Peters, OTR/L,CLT 06/10/2024, 9:38 AM

## 2024-06-11 ENCOUNTER — Ambulatory Visit: Admitting: Occupational Therapy

## 2024-06-14 ENCOUNTER — Ambulatory Visit: Attending: Orthopedic Surgery | Admitting: Occupational Therapy

## 2024-06-14 DIAGNOSIS — M25641 Stiffness of right hand, not elsewhere classified: Secondary | ICD-10-CM | POA: Insufficient documentation

## 2024-06-14 DIAGNOSIS — M79641 Pain in right hand: Secondary | ICD-10-CM | POA: Insufficient documentation

## 2024-06-14 DIAGNOSIS — L905 Scar conditions and fibrosis of skin: Secondary | ICD-10-CM | POA: Diagnosis present

## 2024-06-14 NOTE — Therapy (Signed)
 OUTPATIENT OCCUPATIONAL THERAPY ORTHO TREATMENT  Patient Name: Sharon Shepherd MRN: [id number] DOB:1951-01-21, 73 y.o., female Today's Date: 06/14/2024  PCP: Dr Jimmy REFERRING PROVIDER: Dr Camella  END OF SESSION:  OT End of Session - 06/14/24 1334     Visit Number 5    Number of Visits 8    Date for Recertification  07/22/24    OT Start Time 1334    OT Stop Time 1403    OT Time Calculation (min) 29 min    Activity Tolerance Patient tolerated treatment well    Behavior During Therapy [facility] for tasks assessed/performed          Past Medical History:  Diagnosis Date   Allergy     Anemia    Celiac disease    COVID 09/25/2020   Diverticulitis    GERD (gastroesophageal reflux disease)    GIST (gastrointestinal stroma tumor), malignant, colon (HCC)    Hyperlipidemia    Hypertension    Sleep disturbance    Thyroiditis, lymphocytic    surgery 2013   Vitamin B12 deficiency    unresponsive to oral replacement   Past Surgical History:  Procedure Laterality Date   COLONOSCOPY     ESOPHAGOGASTRODUODENOSCOPY  03/04   colon   ESOPHAGOGASTRODUODENOSCOPY N/A 02/15/2021   Procedure: ESOPHAGOGASTRODUODENOSCOPY (EGD);  Surgeon: Teressa Toribio SQUIBB, MD;  Location: [facility];  Service: Endoscopy;  Laterality: N/A;   ESOPHAGOGASTRODUODENOSCOPY N/A 06/20/2022   Procedure: ESOPHAGOGASTRODUODENOSCOPY (EGD);  Surgeon: Wilhelmenia Aloha Raddle., MD;  Location: [facility];  Service: Gastroenterology;  Laterality: N/A;   EUS N/A 02/15/2021   Procedure: UPPER ENDOSCOPIC ULTRASOUND (EUS) RADIAL;  Surgeon: Teressa Toribio SQUIBB, MD;  Location: [facility];  Service: Endoscopy;  Laterality: N/A;   EUS N/A 06/20/2022   Procedure: UPPER ENDOSCOPIC ULTRASOUND (EUS) RADIAL;  Surgeon: Wilhelmenia Aloha Raddle., MD;  Location: [facility];  Service: Gastroenterology;  Laterality: N/A;   EUS N/A 10/23/2023   Procedure: UPPER ENDOSCOPIC ULTRASOUND (EUS) RADIAL;  Surgeon: Wilhelmenia Aloha Raddle., MD;  Location:  [facility];  Service: Gastroenterology;  Laterality: N/A;   FINE NEEDLE ASPIRATION N/A 02/15/2021   Procedure: FINE NEEDLE ASPIRATION (FNA) LINEAR;  Surgeon: Teressa Toribio SQUIBB, MD;  Location: [facility];  Service: Endoscopy;  Laterality: N/A;   Laryngeal implant  2/13   after damage during thyroidectomy   THYROIDECTOMY  12/12   Lymphocytic thyroiditis with goiter--Dr Unknown Sharps   Patient Active Problem List   Diagnosis Date Noted   Trigger finger, right ring finger 02/02/2024   History of diverticulitis 12/29/2023   Acute diverticulitis of intestine 11/02/2023   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) 03/19/2021   Iron  deficiency anemia 10/02/2020   Chronic constipation 06/21/2019   Allergic asthma 01/14/2018   Hyperlipidemia    Vitamin B12 deficiency    Hypothyroidism    Routine general medical examination at a health care facility 01/15/2011   Sleep disturbance 06/05/2009   ANEMIA-NOS 10/22/2007   Essential hypertension, benign 10/22/2007   Diverticulosis of colon 10/22/2007   Celiac disease 10/22/2007    ONSET DATE: 04/22/24  REFERRING DIAG: R 4th digit A1pulley release  THERAPY DIAG:  Pain in right hand  Scar condition and fibrosis of skin  Stiffness of right hand, not elsewhere classified  Rationale for Evaluation and Treatment: Rehabilitation  SUBJECTIVE:   SUBJECTIVE STATEMENT: I am using my hand normally in everything -that middle joint sometimes stiff -and little sore -but I think I have some arthritis-  Pt accompanied by: self  PERTINENT HISTORY:  A1pulley release of R 4th digit done on 04/22/24 by Dr Camella- follow up today and started on steroid and refer to OT   PRECAUTIONS: None    WEIGHT BEARING RESTRICTIONS:   PAIN:  Are you having pain?  No pain just mostly tightness in the fourth digit FALLS: Has patient fallen in last 6 months? 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 - like to garden, do crafts, own house work   PATIENT GOALS: get my finger better so I can use my hand  NEXT MD VISIT: 3 wks   OBJECTIVE:  Note: Objective measures were completed at Evaluation unless otherwise noted.  HAND DOMINANCE: Right  ADLs: Difficulty with making a fist when gripping, pulling, carrying and holding objects  FUNCTIONAL OUTCOME MEASURES: Next session  UPPER EXTREMITY ROM:     Active ROM Right eval Left eval R 06/14/24  Shoulder flexion     Shoulder abduction     Shoulder adduction     Shoulder extension     Shoulder internal rotation     Shoulder external rotation     Elbow flexion     Elbow extension     Wrist flexion     Wrist extension 70 decrease digit extention incombination officiated needed  70  Wrist ulnar deviation     Wrist radial deviation     Wrist pronation     Wrist supination     (Blank rows = not tested)  Active ROM Right eval Left eval R 06/08/24  Thumb MCP (0-60)     Thumb IP (0-80)     Thumb Radial abd/add (0-55)      Thumb Palmar abd/add (0-45)      Thumb Opposition to Small Finger      Index MCP (0-90)      Index PIP (0-100)      Index DIP (0-70)       Long MCP (0-90)       Long PIP (0-100)       Long DIP (0-70)       Ring MCP (0-90)  75   90  Ring PIP (0-100)  90   100  Ring DIP (0-70)  45   65  Little MCP (0-90)       Little PIP (0-100)       Little DIP (0-70)       (Blank rows = not tested)    HAND FUNCTION: 06/08/24 Grip strength: Right: 64 lbs; Left: 61 lbs, Lateral pinch: Right: 14 lbs, Left: 12 lbs, and 3 point pinch: Right: 9 lbs, Left: 12 lbs 06/10/24 Grip strength: Right: 64 lbs; Left: 61 lbs, Lateral pinch: Right: 14 lbs, Left: 12 lbs, and 3 point pinch: Right: 10 lbs, Left: 12 lbs COORDINATION: Decreased because of stiffness and pain  SENSATION: Denies any sensory issues  EDEMA: prox phalanges R 6.4 cm L 5.3 cm ; PIP R 6.6 cm and L 5.7cm  COGNITION: Overall cognitive status:  Within functional limits for tasks assessed        TREATMENT DATE: 06/14/24  Patient report she is done with steroid Using her hand normally  Some discomfort in the PIP of 4th with use  But also doing a lot around the house - put green house together the other weekend and helped somebody moved this past weekend Pain and tenderness better over scar  Tolerate scar massage Patient arrived with scar adhesion improved but still adhere Great progress in active range of motion Grip and prehension within normal range except 3-point -but improving to 10lbs R and L 12 lbs   See flowsheet.  Modalities: Paraffin Time: 8 Location: R hand  Decrease stiffness and decrease scar tissue done prior to scar mobilization      Patient to use heat Iin the am prior to ROM and scar tissue  decrease stiffness Soft tissue mobilization rolling over right roller to facilitate soft tissue mobilization and digit extension - to be done every hour while working to address scar and doing some scar massage in combination with 4th digit flexion and extention - Pt on computer the whole day at work - using mouse with R hand Can do scar massage  3 x day use coban piece for traction and do scar mobs opposite direction of digits ext and flexion  BUT DO NOT SLIDE -pt verbalize understanding Focus on scar mobilization today using mini massager, and dry static cupping on palm in combination with digits extention - repeat 3 x 10-15 reps  Also scar mobs manual therapy by OT in  combination with digit extension and flexion over Coban for traction No tenderness today Cica -Care scar pad for nighttime-  In combination with Isotoner glove at nighttime to decrease edema Composite digit and wrist extension prayer stretch to be done 10 reps pain-free And table slides 20 reps 2-3 x day  Tendon glides 10  reps with focus on extension and with MC flexion with PIP extention    PATIENT EDUCATION: Education details: findings of eval and HEP  Person educated: Patient Education method: Explanation, Demonstration, Tactile cues, Verbal cues, and Handouts Education comprehension: verbalized understanding, returned demonstration, verbal cues required, and needs further education     GOALS: Goals reviewed with patient? Yes  SHORT TERM GOALS: Target date: 2wks  Patient's to be independent in home program for tenderness and pain decreased to less than 2/10 with range of motion and soft tissue mobilization Baseline: Patient pain can increase to 6-7/10 over the scar and fourth digit; scar pad provided today.  Patient started on a steroid today.  Limited flexion of fourth digit NOW no pain more stiffness than pain at PIP of 4th  Goal status:met  LONG TERM GOALS: Target date: 8 wks  Right fourth digit flexion improved to within normal limits pain-free for patient to hold toothbrush and utensils without increase symptoms Baseline: Pain and tenderness 6-7/10, fourth digit flexion 75 MC flexion, PIP 90 degrees and DIP 45 degrees NOW no pain and tenderness- MC flexion 90, PIP 100 and DIP 65 NOW no pain with AROM - MC 90, PIP 95-100 and DIP 65-70 Goal status:MET  2.  Right wrist and digit extension improved to within normal limits symptom-free for patient to be able to push push heavy door open Baseline: Decreased MC Extension -30 degrees with a slight pull and discomfort over the scar.  Pain can increase to 6-7/10 per patient NOW no pain and ind in using R hand pushing and pulling heavy door  Goal status:MET  3.  Scar adhesion and edema improved for patient to have pain-free  range of motion and scar mobilization. Baseline: Scar adhere for composite flexion and extension.  Increased tenderness pain can increase to 6-7/10.  Edema increased more than a centimeter at the proximal phalanges compared to the left  and 0.8 cm at the PIP NOW scar still adhere proximal in combination with 4th digit end range flexion and ext -  Goal status: Partially   4.  Right grip and prehension strength improved to more than 75% compared to the left to be able to perform ADLs and IADLs symptom-free Baseline: Not tested 5 weeks postop with increased pain.  Steroid started today NOW within normal range for her age and pain free  Goal status:MET  ASSESSMENT:  CLINICAL IMPRESSION: Patient seen for occupational therapy for right dominant hand fourth A1 pulley release by Dr. Camella on 04/22/2024-patient was started on a steroid pack today.  Patient with increased pain 6-7/10 with range of motion and over scar.  Patient with increased edema in fourth digit and palm.  Proximal phalanges increased more than 1 cm compared to the left.  Patient with decreased flexion and extension of fourth digit.  As well as decreased grip and prehension strength.  NOW patient with great improvement in active range of motion right fourth digit as well as decreased tenderness and inflammation over the scar.  Patient idone with taking her steroids.  Patient still showing some scar adhesion but improving greatly.  Able to tolerate scar mobilization better and cupping.  Patient to continue with focus on scar mobilization at home in combination with digit extension flexion for end range adhesion.  3-4 times a day.  And stretches in between while using the mouse and computer. Pt grip and prehension strength within normal range for her age - and using hand normally - pt feels like she has some arthritis - did review with her some joint protection and modifications - pt to see surgeon next week -   Patient can benefit from skilled OT services to decrease edema and pain as well as scar tissue increase motion and strength to return to prior level of function.  PERFORMANCE DEFICITS: in functional skills including ADLs, IADLs, ROM, strength, pain, flexibility, decreased  knowledge of use of DME, and UE functional use,   and psychosocial skills including environmental adaptation and routines and behaviors.   IMPAIRMENTS: are limiting patient from ADLs, IADLs, rest and sleep, play, leisure, and social participation.   COMORBIDITIES: has no other co-morbidities that affects occupational performance. Patient will benefit from skilled OT to address above impairments and improve overall function.  MODIFICATION OR ASSISTANCE TO COMPLETE EVALUATION: No modification of tasks or assist necessary to complete an evaluation.  OT OCCUPATIONAL PROFILE AND HISTORY: Problem focused assessment: Including review of records relating to presenting problem.  CLINICAL DECISION MAKING: LOW - limited treatment options, no task modification necessary  REHAB POTENTIAL: Good for goals  EVALUATION COMPLEXITY: Low    PLAN:  OT FREQUENCY: 1-2x/week  OT DURATION: 8 weeks  PLANNED INTERVENTIONS: 97168 OT Re-evaluation, 97535 self care/ADL training, 02889 therapeutic exercise, 97530 therapeutic activity, 97112 neuromuscular re-education, 97140 manual therapy, 97035 ultrasound, 97018 paraffin, 02960 fluidotherapy, 97034 contrast bath, 97760 Orthotic Initial, [id number] Orthotic/Prosthetic subsequent, passive range of motion, patient/family education, and DME and/or AE instructions    CONSULTED AND AGREED WITH PLAN OF CARE: Patient     Ancel Peters, OTR/L,CLT 06/14/2024, 2:16 PM

## 2024-06-18 ENCOUNTER — Ambulatory Visit: Admitting: Occupational Therapy

## 2024-08-10 ENCOUNTER — Ambulatory Visit

## 2024-08-10 VITALS — BP 116/80 | HR 72 | Temp 97.3°F | Ht 65.5 in | Wt 208.0 lb

## 2024-08-10 DIAGNOSIS — Z131 Encounter for screening for diabetes mellitus: Secondary | ICD-10-CM

## 2024-08-10 DIAGNOSIS — E538 Deficiency of other specified B group vitamins: Secondary | ICD-10-CM

## 2024-08-10 DIAGNOSIS — C49A2 Gastrointestinal stromal tumor of stomach: Secondary | ICD-10-CM | POA: Diagnosis not present

## 2024-08-10 DIAGNOSIS — Z9189 Other specified personal risk factors, not elsewhere classified: Secondary | ICD-10-CM

## 2024-08-10 DIAGNOSIS — E038 Other specified hypothyroidism: Secondary | ICD-10-CM | POA: Diagnosis not present

## 2024-08-10 DIAGNOSIS — E7849 Other hyperlipidemia: Secondary | ICD-10-CM | POA: Diagnosis not present

## 2024-08-10 DIAGNOSIS — E559 Vitamin D deficiency, unspecified: Secondary | ICD-10-CM | POA: Diagnosis not present

## 2024-08-10 DIAGNOSIS — I10 Essential (primary) hypertension: Secondary | ICD-10-CM | POA: Diagnosis not present

## 2024-08-10 DIAGNOSIS — D508 Other iron deficiency anemias: Secondary | ICD-10-CM | POA: Diagnosis not present

## 2024-08-10 NOTE — Progress Notes (Signed)
 "  Subjective:   This visit was conducted in person. The patient gave informed consent to the use of Abridge AI technology to record the contents of the encounter as documented below.   Patient ID: Sharon Shepherd, female    DOB: [birth year], 73 y.o.   MRN: [id number]   Discussed the use of AI scribe software for clinical note transcription with the patient, who gave verbal consent to proceed.  History of Present Illness Sharon Shepherd is a 73 year old female who presents for routine follow-up and lab work.  She has a history of hypothyroidism and is taking Synthroid  50 mcg daily on an empty stomach, ensuring at least an hour gap before consuming other medications or food.  She has high cholesterol and is on atorvastatin  20 mg daily.  She has a B12 deficiency and takes a B12 supplement daily.  She has iron  deficiency anemia, previously treated with IV iron  infusions about three years ago due to absorption issues related to celiac disease. She does not take oral iron  supplements as they are not well absorbed.  She has a history of a gastrointestinal stromal tumor and follows up with periodic EUS. She had a surveillance scope in 2027.  She has a history of diverticulosis and was hospitalized a few months ago for diverticulitis.  She has allergic asthma but seldom needs albuterol .  She has high blood pressure and is taking valsartan  hydrochlorothiazide . Her kidney function was checked earlier this year.  She has a history of vertigo and was previously prescribed Zofran  and meclizine , but has not needed them recently. She also tried vestibular physical therapy without success. She takes Linzess  regularly and has Valium  at home, which was used for severe vertigo but is not currently needed.  She takes vitamin D3 and Zyrtec daily, and a baby aspirin 81 mg, which she started about ten years ago due to family history of heart problems.  She has an allergy  to codeine, which causes  itching.  Her thyroid  was removed in 2012, and she has a laryngeal implant placed in 2013.  Her family history includes polyposis in her sister, who has undergone genetic testing with negative results. Her sister also has rheumatoid arthritis and is considering surgery due to numerous gastric polyps.  She is married, has two children, and works for [employer]. She rarely consumes alcohol and has never smoked.     Review of Systems  All other systems reviewed and are negative.       Allergies[1]  Medications Ordered Prior to Encounter[2]  BP 116/80 (BP Location: Left Arm, Patient Position: Sitting, Cuff Size: Large)   Pulse 72   Temp (!) 97.3 F (36.3 C) (Oral)   Ht 5' 5.5 (1.664 m)   Wt 208 lb (94.3 kg)   SpO2 96%   BMI 34.09 kg/m   Objective:      Physical Exam GENERAL: Alert, cooperative, well developed, no acute distress. HEAD: Normocephalic atraumatic. EYES: Extraocular movements intact BL, pupils round, equal and reactive to light BL, conjunctivae normal BL. EXTREMITIES: No cyanosis or edema. NEUROLOGICAL: Oriented to person, place and time, no gait abnormalities, moves all extremities without gross motor or sensory deficit.         Assessment & Plan:   Assessment & Plan Hypothyroidism Managed with Synthroid  50 mcg daily, taking correctly.  TSH WNL 1 year ago, due for repeat - Ordered thyroid  function tests. -Continue Synthroid  50 mcg daily  Hyperlipidemia Managed with atorvastatin  20 mg daily. - Ordered  lipid panel.  Vitamin B12 deficiency Managed with daily B12 supplementation. - Ordered B12 level.  Iron  deficiency anemia due to celiac disease Iron  deficiency anemia likely due to malabsorption related to celiac disease.  Previously on IV infusions, last infusion reportedly a few years ago. - Ordered iron  studies.   Essential hypertension Managed with valsartan -hydrochlorothiazide . Blood pressure is well-controlled, no changes. - Continue  valsartan  hydrochlorothiazide  320-12.5mg  daily  Aortic atherosclerosis At risk of med side effect Noted on recent CT scan. Statin therapy is beneficial. Discussed potential justification for continuing aspirin therapy based on aortic atherosclerosis and elevated LipoA levels. - Ordered LipoA level. - Continue daily aspirin   History of gastrointestinal stromal tumor Regular follow-up with Dr Wilhelmenia. Last upper endoscopic ultrasound from 10/23/2023 was WNL, 2-year repeat recommended given stability of the tumor.  Diabetes screening Per chart review, she has never had an A1c, will order.    Scheduled CPE in June 2026  Sharon MARLA Burkes, MD  08/10/2024     Contains text generated by Abridge.       [1]  Allergies Allergen Reactions   Codeine Itching  [2]  Current Outpatient Medications on File Prior to Visit  Medication Sig Dispense Refill   albuterol  (VENTOLIN  HFA) 108 (90 Base) MCG/ACT inhaler Inhale 2 puffs into the lungs every 6 (six) hours as needed for wheezing or shortness of breath. 8 g 0   aspirin 81 MG chewable tablet Chew 81 mg by mouth every other day. In the evening     atorvastatin  (LIPITOR) 20 MG tablet TAKE 1 TABLET BY MOUTH ONCE  DAILY 90 tablet 3   cetirizine (ZYRTEC) 10 MG tablet TAKE 1 TABLET BY MOUTH DAILY 90 tablet 3   Cholecalciferol (VITAMIN D3) 5000 UNITS CAPS Take 5,000 Units by mouth at bedtime.     Cyanocobalamin  (B-12) 5000 MCG SUBL Place 1 tablet under the tongue daily in the afternoon.     levothyroxine  (SYNTHROID ) 50 MCG tablet TAKE 1 TABLET BY MOUTH DAILY 90 tablet 3   linaclotide  (LINZESS ) 290 MCG CAPS capsule Take 1 capsule (290 mcg total) by mouth every other day. TAKE ONE CAPSULE BY MOUTH EVERY OTHER DAY IN THE MORNING 90 capsule 0   meclizine  (ANTIVERT ) 25 MG tablet Take 1-2 tablets (25-50 mg total) by mouth 3 (three) times daily as needed. For vertigo 60 tablet 0   valsartan -hydrochlorothiazide  (DIOVAN -HCT) 320-12.5 MG tablet TAKE 1 TABLET  BY MOUTH DAILY 90 tablet 3   No current facility-administered medications on file prior to visit.   "

## 2024-08-10 NOTE — Patient Instructions (Addendum)
 Thank you for visiting [facility] today! Here's what we talked about: - I will be in touch about Aspirin, you can continue it daily for now and continue all your other medications

## 2024-08-12 LAB — IRON,TIBC AND FERRITIN PANEL
Ferritin: 20 ng/mL (ref 15–150)
Iron Saturation: 16 % (ref 15–55)
Iron: 67 ug/dL (ref 27–139)
Total Iron Binding Capacity: 431 ug/dL (ref 250–450)
UIBC: 364 ug/dL (ref 118–369)

## 2024-08-12 LAB — LIPID PANEL
Chol/HDL Ratio: 2.4 ratio (ref 0.0–4.4)
Cholesterol, Total: 143 mg/dL (ref 100–199)
HDL: 60 mg/dL
LDL Chol Calc (NIH): 67 mg/dL (ref 0–99)
Triglycerides: 84 mg/dL (ref 0–149)
VLDL Cholesterol Cal: 16 mg/dL (ref 5–40)

## 2024-08-12 LAB — LIPOPROTEIN A (LPA): Lipoprotein (a): 8.7 nmol/L

## 2024-08-12 LAB — VITAMIN B12: Vitamin B-12: 396 pg/mL (ref 232–1245)

## 2024-08-12 LAB — HEMOGLOBIN A1C
Est. average glucose Bld gHb Est-mCnc: 108 mg/dL
Hgb A1c MFr Bld: 5.4 % (ref 4.8–5.6)

## 2024-08-12 LAB — TSH: TSH: 5.53 u[IU]/mL — ABNORMAL HIGH (ref 0.450–4.500)

## 2024-08-12 LAB — VITAMIN D 25 HYDROXY (VIT D DEFICIENCY, FRACTURES): Vit D, 25-Hydroxy: 62.6 ng/mL (ref 30.0–100.0)

## 2024-08-13 ENCOUNTER — Ambulatory Visit: Payer: Self-pay

## 2024-08-13 DIAGNOSIS — E038 Other specified hypothyroidism: Secondary | ICD-10-CM

## 2024-08-13 MED ORDER — LEVOTHYROXINE SODIUM 75 MCG PO TABS: 75.0000 ug | ORAL_TABLET | Freq: Every day | ORAL | 3 refills | Status: AC

## 2024-08-13 NOTE — Telephone Encounter (Signed)
 Copied from CRM [id number]. Topic: General - Other >> Aug 13, 2024 10:07 AM Roselie BROCKS wrote: Reason for CRM: Patient requests a call back from clinic before scheduling a lab appnt, to speak with someone concerning getting the lab work.

## 2024-08-19 ENCOUNTER — Other Ambulatory Visit (HOSPITAL_COMMUNITY): Payer: Self-pay

## 2024-08-19 ENCOUNTER — Encounter: Payer: Self-pay | Admitting: Oncology

## 2024-08-23 MED ORDER — MECLIZINE HCL 25 MG PO TABS: 25.0000 mg | ORAL_TABLET | Freq: Three times a day (TID) | ORAL | 0 refills | Status: AC | PRN

## 2024-08-25 ENCOUNTER — Other Ambulatory Visit: Payer: Self-pay | Admitting: Family

## 2024-10-04 ENCOUNTER — Other Ambulatory Visit

## 2025-02-02 ENCOUNTER — Encounter
